# Patient Record
Sex: Female | Born: 1940
Health system: Southern US, Community
[De-identification: ages and names within clinical notes are randomized; demographics above are authoritative.]

## PROBLEM LIST (undated history)

## (undated) DIAGNOSIS — N8189 Other female genital prolapse: Secondary | ICD-10-CM

## (undated) DIAGNOSIS — R112 Nausea with vomiting, unspecified: Secondary | ICD-10-CM

## (undated) DIAGNOSIS — R079 Chest pain, unspecified: Secondary | ICD-10-CM

## (undated) DIAGNOSIS — I341 Nonrheumatic mitral (valve) prolapse: Secondary | ICD-10-CM

## (undated) DIAGNOSIS — M8580 Other specified disorders of bone density and structure, unspecified site: Secondary | ICD-10-CM

## (undated) DIAGNOSIS — Z9289 Personal history of other medical treatment: Secondary | ICD-10-CM

## (undated) DIAGNOSIS — I10 Essential (primary) hypertension: Secondary | ICD-10-CM

## (undated) DIAGNOSIS — I480 Paroxysmal atrial fibrillation: Secondary | ICD-10-CM

## (undated) DIAGNOSIS — E7800 Pure hypercholesterolemia, unspecified: Secondary | ICD-10-CM

## (undated) DIAGNOSIS — H35342 Macular cyst, hole, or pseudohole, left eye: Secondary | ICD-10-CM

## (undated) DIAGNOSIS — Z9889 Other specified postprocedural states: Secondary | ICD-10-CM

## (undated) HISTORY — DX: Essential (primary) hypertension: I10

## (undated) HISTORY — DX: Personal history of other medical treatment: Z92.89

## (undated) HISTORY — PX: CATARACT EXTRACTION, BILATERAL: SHX1313

## (undated) HISTORY — PX: EYE SURGERY: SHX253

## (undated) HISTORY — DX: Paroxysmal atrial fibrillation: I48.0

## (undated) HISTORY — DX: Pure hypercholesterolemia, unspecified: E78.00

## (undated) HISTORY — PX: COLONOSCOPY: SHX174

## (undated) HISTORY — PX: DILATION AND CURETTAGE OF UTERUS: SHX78

## (undated) HISTORY — DX: Other specified disorders of bone density and structure, unspecified site: M85.80

## (undated) HISTORY — DX: Chest pain, unspecified: R07.9

## (undated) HISTORY — DX: Nonrheumatic mitral (valve) prolapse: I34.1

## (undated) HISTORY — DX: Other female genital prolapse: N81.89

---

## 1999-04-18 ENCOUNTER — Other Ambulatory Visit: Admission: RE | Admit: 1999-04-18 | Discharge: 1999-04-18 | Payer: Self-pay | Admitting: Obstetrics and Gynecology

## 2002-03-23 ENCOUNTER — Encounter: Payer: Self-pay | Admitting: Emergency Medicine

## 2002-03-23 ENCOUNTER — Emergency Department (HOSPITAL_COMMUNITY): Admission: EM | Admit: 2002-03-23 | Discharge: 2002-03-23 | Payer: Self-pay | Admitting: Emergency Medicine

## 2002-05-24 ENCOUNTER — Encounter: Payer: Self-pay | Admitting: Family Medicine

## 2002-05-24 ENCOUNTER — Ambulatory Visit (HOSPITAL_COMMUNITY): Admission: RE | Admit: 2002-05-24 | Discharge: 2002-05-24 | Payer: Self-pay | Admitting: Family Medicine

## 2003-05-26 ENCOUNTER — Ambulatory Visit (HOSPITAL_COMMUNITY): Admission: RE | Admit: 2003-05-26 | Discharge: 2003-05-26 | Payer: Self-pay | Admitting: Obstetrics and Gynecology

## 2003-11-25 ENCOUNTER — Ambulatory Visit (HOSPITAL_COMMUNITY): Admission: RE | Admit: 2003-11-25 | Discharge: 2003-11-25 | Payer: Self-pay | Admitting: Internal Medicine

## 2005-07-05 ENCOUNTER — Ambulatory Visit (HOSPITAL_COMMUNITY): Admission: RE | Admit: 2005-07-05 | Discharge: 2005-07-05 | Payer: Self-pay | Admitting: Internal Medicine

## 2006-03-10 ENCOUNTER — Ambulatory Visit (HOSPITAL_COMMUNITY): Admission: RE | Admit: 2006-03-10 | Discharge: 2006-03-10 | Payer: Self-pay | Admitting: Obstetrics and Gynecology

## 2006-03-12 ENCOUNTER — Ambulatory Visit (HOSPITAL_COMMUNITY): Admission: RE | Admit: 2006-03-12 | Discharge: 2006-03-12 | Payer: Self-pay | Admitting: Obstetrics and Gynecology

## 2007-05-13 HISTORY — PX: DOPPLER ECHOCARDIOGRAPHY: SHX263

## 2009-06-05 HISTORY — PX: OTHER SURGICAL HISTORY: SHX169

## 2009-12-28 ENCOUNTER — Ambulatory Visit (HOSPITAL_COMMUNITY): Admission: RE | Admit: 2009-12-28 | Discharge: 2009-12-28 | Payer: Self-pay | Admitting: Internal Medicine

## 2010-01-11 ENCOUNTER — Ambulatory Visit (HOSPITAL_COMMUNITY): Admission: RE | Admit: 2010-01-11 | Discharge: 2010-01-11 | Payer: Self-pay | Admitting: Neurology

## 2010-03-30 ENCOUNTER — Ambulatory Visit (HOSPITAL_COMMUNITY): Admission: RE | Admit: 2010-03-30 | Discharge: 2010-03-30 | Payer: Self-pay | Admitting: Obstetrics and Gynecology

## 2011-04-30 DIAGNOSIS — M8580 Other specified disorders of bone density and structure, unspecified site: Secondary | ICD-10-CM

## 2011-04-30 HISTORY — DX: Other specified disorders of bone density and structure, unspecified site: M85.80

## 2011-06-04 ENCOUNTER — Encounter: Payer: Self-pay | Admitting: Gynecology

## 2011-06-04 ENCOUNTER — Other Ambulatory Visit: Payer: Self-pay | Admitting: Gynecology

## 2011-06-04 ENCOUNTER — Ambulatory Visit (INDEPENDENT_AMBULATORY_CARE_PROVIDER_SITE_OTHER): Payer: BC Managed Care – PPO | Admitting: Gynecology

## 2011-06-04 VITALS — BP 134/72 | Ht 66.5 in | Wt 159.0 lb

## 2011-06-04 DIAGNOSIS — L293 Anogenital pruritus, unspecified: Secondary | ICD-10-CM

## 2011-06-04 DIAGNOSIS — N950 Postmenopausal bleeding: Secondary | ICD-10-CM

## 2011-06-04 DIAGNOSIS — N898 Other specified noninflammatory disorders of vagina: Secondary | ICD-10-CM

## 2011-06-04 DIAGNOSIS — N9089 Other specified noninflammatory disorders of vulva and perineum: Secondary | ICD-10-CM

## 2011-06-04 DIAGNOSIS — N390 Urinary tract infection, site not specified: Secondary | ICD-10-CM

## 2011-06-04 DIAGNOSIS — N952 Postmenopausal atrophic vaginitis: Secondary | ICD-10-CM

## 2011-06-04 DIAGNOSIS — R350 Frequency of micturition: Secondary | ICD-10-CM

## 2011-06-04 LAB — URINALYSIS W MICROSCOPIC + REFLEX CULTURE
Crystals: NONE SEEN
Glucose, UA: NEGATIVE mg/dL
Nitrite: NEGATIVE
Protein, ur: NEGATIVE mg/dL

## 2011-06-04 MED ORDER — CIPROFLOXACIN HCL 250 MG PO TABS: 250.0000 mg | ORAL_TABLET | Freq: Two times a day (BID) | ORAL | Status: AC

## 2011-06-04 MED ORDER — CLINDAMYCIN PHOSPHATE 2 % VA CREA: 1.0000 | TOPICAL_CREAM | Freq: Every day | VAGINAL | Status: AC

## 2011-06-04 NOTE — Progress Notes (Signed)
Brittany Weaver 1940-06-08 [id number]        71 y.o.  New patient complaining of urinary frequency for several months and a cloudy urine. Also notes intense posterior vulvar vaginal pain and irritation. She has been using Premarin vaginal cream intermittently per Dr. Elana Alm for several years but this does not seem to be helping. She also notes episodes of some vaginal bleeding several years ago but none recently.  Past medical history,surgical history, medications, allergies, family history and social history were all reviewed and documented in the EPIC chart. ROS:  Was performed and pertinent positives and negatives are included in the history.  Exam: Sherri chaperone present Filed Vitals:   06/04/11 0952  BP: 134/72   General appearance  Normal Skin grossly normal Head/Neck normal with no cervical or supraclavicular adenopathy thyroid normal Lungs  clear Cardiac RR, without RMG Abdominal  soft, nontender, without masses, organomegaly or hernia Breasts  examined lying and sitting without masses, retractions, discharge or axillary adenopathy. Pelvic  Ext/BUS/vagina  normal with atrophic genital changes and a heavy white discharge mild red erythema at the posterior fourchette. Linear pigmented lesion mid left labia majora.  Cervix  normal    Uterus  anteverted, normal size, shape and contour, midline and mobile nontender   Adnexa  Without masses or tenderness    Anus and perineum  normal   Rectovaginal  normal sphincter tone without palpated masses or tenderness. Old external hemorrhoids noted.   Assessment/Plan:  71 y.o. female for annual exam.    1. Urinary symptoms. UA is consistent with UTI with TNTC WBC and bacteria. We'll treat with ciprofloxacin 250 mg twice a day x7 days. Follow up if symptoms persist or recur. 2. Vulvar pigmented lesion. Patient never noticed this before. Recommended excision and she will make an appointment for this. 3. Vulvar irritation. Wet prep is positive  for BV. We'll treat with Cleocin vaginal cream nightly x1 week. Follow up if symptoms persist or recur. 4. Atrophic vaginitis. Using Premarin cream unopposed. Had some bleeding early on but has not had any over the last several years. Recommend start with ultrasound for endometrial echo assessment. Possible biopsy if thicker. I reviewed the issues of unopposed estrogen and the WHI study increased risk of stroke heart attack DVT possible breast cancer risk. The need for progesterone protection discussed. Alternatives such as Vagifem also reviewed with less absorption advantages. We'll rediscuss after ultrasound/vulvar biopsy/results from Cleocin Cipro treatment. 5. Breast health. Patient due for mammogram now and she knows to schedule this and agrees to do so. SBE monthly reviewed. 6. Bone density. Patient reports a normal DEXA 2 years ago. Increase calcium vitamin D reviewed. 7. Pap smear. No Pap smear was done today. Her last Pap smear was April 2011 and was normal per her history.  She has no history of abnormal Pap smears before and has been getting them on a regular basis. I discussed current screening guidelines and the options to stop doing Pap smears from now on discussed as she is over age 40 and she is comfortable with this. 8. Colonoscopy. Patient has never had a colonoscopy. I strongly suggested she schedule this and she agrees to do so I gave her names of gastroenterology practices is in town. 9. Health maintenance. No blood work was done today as this done through her primary physician who follows her for her medical issues.    Dara Lords MD, 10:59 AM 06/04/2011

## 2011-06-04 NOTE — Patient Instructions (Signed)
Follow up for ultrasound and vulvar biopsy appointments. Schedule colonoscopy.

## 2011-06-06 ENCOUNTER — Other Ambulatory Visit: Payer: Self-pay | Admitting: Gynecology

## 2011-06-06 DIAGNOSIS — Z139 Encounter for screening, unspecified: Secondary | ICD-10-CM

## 2011-06-06 LAB — URINE CULTURE: Organism ID, Bacteria: NO GROWTH

## 2011-06-07 ENCOUNTER — Ambulatory Visit (HOSPITAL_COMMUNITY)
Admission: RE | Admit: 2011-06-07 | Discharge: 2011-06-07 | Disposition: A | Discharge status: Home / Self Care | Payer: Medicare Other | Source: Ambulatory Visit | Attending: Gynecology | Admitting: Gynecology

## 2011-06-07 DIAGNOSIS — Z1231 Encounter for screening mammogram for malignant neoplasm of breast: Secondary | ICD-10-CM | POA: Insufficient documentation

## 2011-06-07 DIAGNOSIS — Z139 Encounter for screening, unspecified: Secondary | ICD-10-CM

## 2011-06-17 ENCOUNTER — Ambulatory Visit (INDEPENDENT_AMBULATORY_CARE_PROVIDER_SITE_OTHER): Payer: Medicare Other

## 2011-06-17 ENCOUNTER — Encounter: Payer: Self-pay | Admitting: Gynecology

## 2011-06-17 ENCOUNTER — Ambulatory Visit (INDEPENDENT_AMBULATORY_CARE_PROVIDER_SITE_OTHER): Payer: Medicare Other | Admitting: Gynecology

## 2011-06-17 ENCOUNTER — Other Ambulatory Visit: Payer: Self-pay | Admitting: Gynecology

## 2011-06-17 DIAGNOSIS — N950 Postmenopausal bleeding: Secondary | ICD-10-CM

## 2011-06-17 DIAGNOSIS — N390 Urinary tract infection, site not specified: Secondary | ICD-10-CM

## 2011-06-17 DIAGNOSIS — N83339 Acquired atrophy of ovary and fallopian tube, unspecified side: Secondary | ICD-10-CM

## 2011-06-17 DIAGNOSIS — N952 Postmenopausal atrophic vaginitis: Secondary | ICD-10-CM

## 2011-06-17 NOTE — Progress Notes (Signed)
Patient also for sonohysterogram do 2 history of vaginal bleeding several years ago and the use of vaginal Premarin cream. Ultrasound initially shows homogeneous myometrial pattern. The endometrial echo 1.7 mm. Right and left ovaries visualized and normal consistent with postmenopausal status.  Cystogram was canceled due to the thin endometrium. I reviewed with the patient that with an endometrial echo of 1.7 mm it is highly unlikely that there is endometrial pathology and she agrees with skipping the biopsy. She has an appointment next week for her vulvar biopsy and will follow up for this. She does note that her vulvar symptoms have resolved and she is doing well from that standpoint.

## 2011-06-17 NOTE — Patient Instructions (Signed)
Follow up for vulvar biopsy 

## 2011-06-26 ENCOUNTER — Encounter: Payer: Self-pay | Admitting: Gynecology

## 2011-06-26 ENCOUNTER — Ambulatory Visit: Payer: BC Managed Care – PPO | Admitting: Gynecology

## 2011-06-26 ENCOUNTER — Ambulatory Visit (INDEPENDENT_AMBULATORY_CARE_PROVIDER_SITE_OTHER): Payer: Medicare Other | Admitting: Gynecology

## 2011-06-26 DIAGNOSIS — N952 Postmenopausal atrophic vaginitis: Secondary | ICD-10-CM

## 2011-06-26 DIAGNOSIS — N9089 Other specified noninflammatory disorders of vulva and perineum: Secondary | ICD-10-CM

## 2011-06-26 MED ORDER — ESTRADIOL 10 MCG VA TABS: 1.0000 | ORAL_TABLET | VAGINAL | Status: DC

## 2011-06-26 NOTE — Patient Instructions (Signed)
Office will call you with the biopsy results. Call us if the Vagifem is not working.

## 2011-06-26 NOTE — Progress Notes (Signed)
Patient presents with 2 issues. #1 Vaginal atrophy. She had been using intermittent Premarin vaginal cream per Dr. Angeline Slim was doing well with this. We recently did an ultrasound for endometrial surveillance and she had a thin endometrial stripe. She would like to restart the estrogen. #2 Pigmented raised linear lesion left labia majora that she is here to have excised.  Exam was Sherrilyn Rist chaperone present Raised pigmented linear lesion left labia majora. Area was cleansed with Betadine infiltrated with 1% lidocaine and the lesion was excised in its entirety in an elliptical incision. The skin and incision was closed using interrupted 3-0 Vicryl sutures x4. Postoperative care instructions were given. Patient will follow up for biopsy results.  Assessment and plan: 1. Atrophic vaginitis symptomatic. Discussed options. I recommended we try Vagifem 10 mcg I gave her a one-month start her sample and wrote her a prescription for an annual refill. I discussed the possible absorption risks and the WHI study to include stroke heart attack DVT possible breast cancer risk and endometrial stimulation. Patient understands and accepts. 2. Pigmented left labial lesion. It was excised sent to pathology and she will follow up for results.

## 2011-07-11 ENCOUNTER — Other Ambulatory Visit (HOSPITAL_COMMUNITY): Payer: Self-pay | Admitting: Family Medicine

## 2011-07-11 DIAGNOSIS — Z139 Encounter for screening, unspecified: Secondary | ICD-10-CM

## 2011-07-16 ENCOUNTER — Telehealth: Payer: Self-pay

## 2011-07-16 ENCOUNTER — Ambulatory Visit (HOSPITAL_COMMUNITY)
Admission: RE | Admit: 2011-07-16 | Discharge: 2011-07-16 | Disposition: A | Discharge status: Home / Self Care | Payer: Medicare Other | Source: Ambulatory Visit | Attending: Family Medicine | Admitting: Family Medicine

## 2011-07-16 DIAGNOSIS — Z139 Encounter for screening, unspecified: Secondary | ICD-10-CM

## 2011-07-16 DIAGNOSIS — Z13820 Encounter for screening for osteoporosis: Secondary | ICD-10-CM | POA: Insufficient documentation

## 2011-07-16 DIAGNOSIS — Z780 Asymptomatic menopausal state: Secondary | ICD-10-CM | POA: Insufficient documentation

## 2011-07-16 NOTE — Telephone Encounter (Signed)
PT. STATES STITCHES ARE STILL AT VULVAR AREA(APPT. WAS 06-26-11) AND WANTS TO HAVE AREA CHECKED I TRANSFERRED PT. TO APPTS.

## 2011-07-17 ENCOUNTER — Encounter: Payer: Self-pay | Admitting: Gynecology

## 2011-07-17 ENCOUNTER — Ambulatory Visit (INDEPENDENT_AMBULATORY_CARE_PROVIDER_SITE_OTHER): Payer: Medicare Other | Admitting: Gynecology

## 2011-07-17 VITALS — BP 130/78

## 2011-07-17 DIAGNOSIS — Z9889 Other specified postprocedural states: Secondary | ICD-10-CM

## 2011-07-17 NOTE — Patient Instructions (Signed)
Follow up as needed. Otherwise in one year.

## 2011-07-17 NOTE — Progress Notes (Signed)
Patient presents in follow up for persistent sutures status post labial lesion excision. She's otherwise doing well. I reviewed pathology with her which showed a benign seborrheic keratoses.  Exam with Elane Fritz chaperone present External with several Vicryl sutures in place left labia majora.  These were excised. The incision has healed nicely.  Patient will follow up when she is due for her next annual follow up sooner if any issues.

## 2012-07-01 ENCOUNTER — Encounter: Payer: Medicare Other | Admitting: Gynecology

## 2012-07-08 ENCOUNTER — Encounter: Payer: Self-pay | Admitting: Gynecology

## 2012-07-08 ENCOUNTER — Ambulatory Visit (INDEPENDENT_AMBULATORY_CARE_PROVIDER_SITE_OTHER): Payer: Medicare Other | Admitting: Gynecology

## 2012-07-08 VITALS — BP 120/70 | Ht 66.0 in | Wt 164.0 lb

## 2012-07-08 DIAGNOSIS — N816 Rectocele: Secondary | ICD-10-CM

## 2012-07-08 DIAGNOSIS — N952 Postmenopausal atrophic vaginitis: Secondary | ICD-10-CM

## 2012-07-08 DIAGNOSIS — N8111 Cystocele, midline: Secondary | ICD-10-CM

## 2012-07-08 MED ORDER — ESTRADIOL 10 MCG VA TABS: 1.0000 | ORAL_TABLET | VAGINAL | Status: DC

## 2012-07-08 NOTE — Progress Notes (Signed)
DALAYA SUPPA [birth year] [id number]        72 y.o.  G3P3003 for annual followup exam.  Several issues noted below  Past medical history,surgical history, medications, allergies, family history and social history were all reviewed and documented in the EPIC chart. ROS:  Was performed and pertinent positives and negatives are included in the history.  Exam: Kim assistant Filed Vitals:   07/08/12 1357  BP: 120/70  Height: 5\' 6"  (1.676 m)  Weight: 164 lb (74.39 kg)   General appearance  Normal Skin grossly normal Head/Neck normal with no cervical or supraclavicular adenopathy thyroid normal Lungs  clear Cardiac RR, without RMG Abdominal  soft, nontender, without masses, organomegaly or hernia Breasts  examined lying and sitting without masses, retractions, discharge or axillary adenopathy. Pelvic  Ext/BUS/vagina  small superficial linear laceration in her junction of labium minora and the vulva. Atrophic genital changes. Mild cystocele/rectocele and uterine prolapse  Cervix  normal   Uterus  anteverted, normal size, shape and contour, midline and mobile nontender. Mild prolapse with straining  Adnexa  Without masses or tenderness    Anus and perineum  normal   Rectovaginal  normal sphincter tone without palpated masses or tenderness.    Assessment/Plan:  72 y.o. G13P3003 female for annual followup exam.   1. Vulvar laceration. Patient had noticed some stinging on her vulva and on exam she has a very superficial linear tear at the junction of her labia majora and vulva. On questioning she thinks it's related to recent intercourse. Recommend observation as this will heal quickly. Followup if it does not. 2. Atrophic vaginitis. Patient using Vagifem doing well wants to continue.  I reviewed with her the issues of absorption and possible uterine stimulation or risks such as stroke heart attack DVT and breast cancer. All of this she understands and accepts. Did have ultrasound last year for  endometrial echo which showed an endometrial measurement of 1.7 mm. 3. Pelvic relaxation. Patient has a mild cystocele rectocele and uterine prolapse. She is asymptomatic from these without bladder or bowel complaints or pressure symptoms. We'll continue to monitor. 4. Pap smear 2011. No Pap smear done today. No history of abnormal Pap smears previously. Review current screening guidelines and we will stop screening and she is comfortable with this. 5. Osteopenia. DEXA 06/2011 T score -1.4. No FRAX done. In comparison to prior DEXA 2007 T score is stable. Continue to observe with repeat at several year interval. 6. Mammography due now and she knows to schedule this. SBE monthly reviewed. 7. Colonoscopy never. I again strongly recommended her to schedule this and the risks of colon cancer reviewed. Patient agrees to do so and is looking into [facility]. 8. Health maintenance. No blood work done as is all done through her primary physician. Followup one year, sooner as needed.    Dara Lords MD, 2:31 PM 07/08/2012

## 2012-07-08 NOTE — Patient Instructions (Signed)
Follow up in one year for annual exam 

## 2012-07-09 LAB — URINALYSIS W MICROSCOPIC + REFLEX CULTURE
Bilirubin Urine: NEGATIVE
Casts: NONE SEEN
Glucose, UA: NEGATIVE mg/dL
Hgb urine dipstick: NEGATIVE
Ketones, ur: NEGATIVE mg/dL
Nitrite: NEGATIVE
Protein, ur: NEGATIVE mg/dL
Squamous Epithelial / LPF: NONE SEEN
Urobilinogen, UA: 0.2 mg/dL (ref 0.0–1.0)

## 2012-07-10 LAB — URINE CULTURE: Colony Count: 65000

## 2012-07-13 ENCOUNTER — Other Ambulatory Visit: Payer: Self-pay | Admitting: Gynecology

## 2012-07-13 DIAGNOSIS — R8271 Bacteriuria: Secondary | ICD-10-CM

## 2012-10-26 ENCOUNTER — Other Ambulatory Visit: Payer: Self-pay | Admitting: *Deleted

## 2012-10-26 MED ORDER — LOVASTATIN 20 MG PO TABS: 20.0000 mg | ORAL_TABLET | Freq: Every day | ORAL | Status: DC

## 2012-10-26 NOTE — Telephone Encounter (Signed)
Refill authorized on lovastatin

## 2013-03-31 ENCOUNTER — Encounter: Payer: Self-pay | Admitting: Gastroenterology

## 2013-04-05 ENCOUNTER — Other Ambulatory Visit: Payer: Self-pay | Admitting: Cardiovascular Disease

## 2013-04-07 ENCOUNTER — Telehealth: Payer: Self-pay | Admitting: *Deleted

## 2013-04-07 DIAGNOSIS — E782 Mixed hyperlipidemia: Secondary | ICD-10-CM

## 2013-04-07 DIAGNOSIS — Z79899 Other long term (current) drug therapy: Secondary | ICD-10-CM

## 2013-04-07 NOTE — Telephone Encounter (Signed)
Paper chart received and reviewed.  Labs ordered and lab slip mailed.

## 2013-04-07 NOTE — Telephone Encounter (Signed)
Returned call and pt verified x 2.  Pt informed message received and refill was sent to [facility] on 12.8.14.  Pt stated she called and the automated message said it wasn't received.  Pt advised to call back and speak w/ a live person as refill was sent and should be ready for pick-up.  Pt also informed labs were not ordered at last visit, but will mail lab slip if needed.  Pt verbalized understanding and agreed w/ plan.

## 2013-04-07 NOTE — Telephone Encounter (Signed)
Paper chart requested.

## 2013-04-07 NOTE — Telephone Encounter (Signed)
Also wants to know if she needs blood work possibly??

## 2013-04-07 NOTE — Telephone Encounter (Signed)
Pt needs a refill on Metoprolol and she has no refills per pharmacy

## 2013-05-14 ENCOUNTER — Ambulatory Visit: Payer: Medicare Other | Admitting: Cardiovascular Disease

## 2013-05-17 ENCOUNTER — Ambulatory Visit (AMBULATORY_SURGERY_CENTER): Payer: Self-pay

## 2013-05-17 VITALS — Ht 66.0 in | Wt 156.0 lb

## 2013-05-17 DIAGNOSIS — Z1211 Encounter for screening for malignant neoplasm of colon: Secondary | ICD-10-CM

## 2013-05-17 MED ORDER — MOVIPREP 100 G PO SOLR: 1.0000 | Freq: Once | ORAL | Status: DC

## 2013-05-20 ENCOUNTER — Encounter: Payer: Self-pay | Admitting: Gastroenterology

## 2013-06-01 ENCOUNTER — Ambulatory Visit (INDEPENDENT_AMBULATORY_CARE_PROVIDER_SITE_OTHER): Payer: Medicare Other | Admitting: Cardiovascular Disease

## 2013-06-01 ENCOUNTER — Encounter: Payer: Self-pay | Admitting: Cardiovascular Disease

## 2013-06-01 VITALS — BP 138/64 | HR 54 | Ht 66.5 in | Wt 162.0 lb

## 2013-06-01 DIAGNOSIS — I10 Essential (primary) hypertension: Secondary | ICD-10-CM | POA: Insufficient documentation

## 2013-06-01 DIAGNOSIS — E785 Hyperlipidemia, unspecified: Secondary | ICD-10-CM

## 2013-06-01 DIAGNOSIS — I480 Paroxysmal atrial fibrillation: Secondary | ICD-10-CM | POA: Insufficient documentation

## 2013-06-01 DIAGNOSIS — I4891 Unspecified atrial fibrillation: Secondary | ICD-10-CM

## 2013-06-01 LAB — COMPREHENSIVE METABOLIC PANEL
ALT: 17 U/L (ref 0–35)
AST: 24 U/L (ref 0–37)
Albumin: 4.5 g/dL (ref 3.5–5.2)
Alkaline Phosphatase: 41 U/L (ref 39–117)
BUN: 18 mg/dL (ref 6–23)
CO2: 27 mEq/L (ref 19–32)
Calcium: 9.9 mg/dL (ref 8.4–10.5)
Chloride: 101 mEq/L (ref 96–112)
Creat: 0.88 mg/dL (ref 0.50–1.10)
Glucose, Bld: 89 mg/dL (ref 70–99)
Potassium: 4.1 mEq/L (ref 3.5–5.3)
Sodium: 136 mEq/L (ref 135–145)
Total Bilirubin: 0.6 mg/dL (ref 0.2–1.2)
Total Protein: 7.4 g/dL (ref 6.0–8.3)

## 2013-06-01 LAB — LIPID PANEL
Cholesterol: 156 mg/dL (ref 0–200)
HDL: 57 mg/dL (ref >39)
LDL Cholesterol: 69 mg/dL (ref 0–99)
Total CHOL/HDL Ratio: 2.7 Ratio
Triglycerides: 151 mg/dL — ABNORMAL HIGH (ref <150)
VLDL: 30 mg/dL (ref 0–40)

## 2013-06-01 NOTE — Progress Notes (Signed)
06/01/2013 Brittany Weaver   1941-01-01  [id number]  Primary Physician Glo Herring., MD Primary Cardiologist: Lorretta Harp MD [name]   HPI:  The patient is a 73 year old, thin-appearing, married Caucasian female, mother of 3 who I last saw in the office a year ago. She has a strong family history of heart disease, as well as hypertension, hyperlipidemia and paroxysmal A-fib. She is a retired Loss adjuster, chartered. Her last Myoview performed 2 years ago was nonischemic. She denies chest pain or shortness of breath. She does have occasional episodes of breakthrough A-fib with some dizziness but no presyncope.      Current Outpatient Prescriptions  Medication Sig Dispense Refill  . aspirin 81 MG tablet Take 160 mg by mouth daily.      . Calcium Carbonate-Vitamin D (CALCIUM + D PO) Take by mouth. 573m once daily      . hydrochlorothiazide (HYDRODIURIL) 25 MG tablet Take 25 mg by mouth daily.      .Marland Kitchenlovastatin (MEVACOR) 20 MG tablet Take 1 tablet (20 mg total) by mouth at bedtime.  30 tablet  6  . Magnesium 400 MG CAPS Take by mouth.      . magnesium oxide (MAG-OX) 400 MG tablet Take 400 mg by mouth daily.      . metoprolol tartrate (LOPRESSOR) 25 MG tablet TAKE ONE TABLET BY MOUTH TWICE DAILY  60 tablet  2  . Multiple Vitamin (MULTIVITAMIN) tablet Take 1 tablet by mouth daily.      . Omega-3 Fatty Acids (FISH OIL) 1200 MG CAPS Take by mouth.      . vitamin B-12 (CYANOCOBALAMIN) 1000 MCG tablet Take 1,000 mcg by mouth daily.      . Estradiol (VAGIFEM) 10 MCG TABS Place 1 tablet (10 mcg total) vaginally 2 (two) times a week.  8 tablet  11  . MOVIPREP 100 G SOLR Take 1 kit (200 g total) by mouth once.  1 kit  0   No current facility-administered medications for this visit.    Allergies  Allergen Reactions  . Penicillins     HIVES  . Prednisone     DIZZINESS    History   Social History  . Marital Status: Married    Spouse Name: N/A    Number of  Children: N/A  . Years of Education: N/A   Occupational History  . Not on file.   Social History Main Topics  . Smoking status: Never Smoker   . Smokeless tobacco: Never Used  . Alcohol Use: Yes     Comment: RARELY  . Drug Use: No  . Sexual Activity: Yes    Birth Control/ Protection: Post-menopausal   Other Topics Concern  . Not on file   Social History Narrative  . No narrative on file     Review of Systems: General: negative for chills, fever, night sweats or weight changes.  Cardiovascular: negative for chest pain, dyspnea on exertion, edema, orthopnea, palpitations, paroxysmal nocturnal dyspnea or shortness of breath Dermatological: negative for rash Respiratory: negative for cough or wheezing Urologic: negative for hematuria Abdominal: negative for nausea, vomiting, diarrhea, bright red blood per rectum, melena, or hematemesis Neurologic: negative for visual changes, syncope, or dizziness All other systems reviewed and are otherwise negative except as noted above.    Blood pressure 138/64, pulse 54, height 5' 6.5" (1.689 m), weight 162 lb (73.483 kg).  General appearance: alert and no distress Neck: no adenopathy, no carotid bruit, no JVD, supple,  symmetrical, trachea midline and thyroid not enlarged, symmetric, no tenderness/mass/nodules Lungs: clear to auscultation bilaterally Heart: regular rate and rhythm, S1, S2 normal, no murmur, click, rub or gallop Extremities: extremities normal, atraumatic, no cyanosis or edema  EKG sinus bradycardia at 54 without ST or T wave changes  ASSESSMENT AND PLAN:   Essential hypertension Well-controlled on current medications  Hyperlipidemia On statin therapy with excellent lipid profile recently checked 05/31/13 with a total cholesterol of 156, LDL 69 and an HDL of 57  Paroxysmal atrial fibrillation Maintaining sinus rhythm with rare breakthrough episodes      Lorretta Harp MD [facility], [facility] 06/01/2013 12:55  PM

## 2013-06-01 NOTE — Patient Instructions (Signed)
Dr Berry wants you to follow-up in 1 year . You will receive a reminder letter in the mail two months in advance. If you don't receive a letter, please call our office to schedule the follow-up appointment. 

## 2013-06-01 NOTE — Assessment & Plan Note (Signed)
Well-controlled on current medications 

## 2013-06-01 NOTE — Assessment & Plan Note (Addendum)
Maintaining sinus rhythm with rare breakthrough episodes

## 2013-06-01 NOTE — Assessment & Plan Note (Signed)
On statin therapy with excellent lipid profile recently checked 05/31/13 with a total cholesterol of 156, LDL 69 and an HDL of 57

## 2013-06-02 ENCOUNTER — Encounter: Payer: Self-pay | Admitting: *Deleted

## 2013-06-04 ENCOUNTER — Ambulatory Visit (AMBULATORY_SURGERY_CENTER): Payer: Medicare Other | Admitting: Gastroenterology

## 2013-06-04 ENCOUNTER — Encounter: Payer: Self-pay | Admitting: Gastroenterology

## 2013-06-04 VITALS — BP 112/61 | HR 43 | Temp 96.1°F | Resp 17 | Ht 66.0 in | Wt 156.0 lb

## 2013-06-04 DIAGNOSIS — Z1211 Encounter for screening for malignant neoplasm of colon: Secondary | ICD-10-CM

## 2013-06-04 MED ORDER — SODIUM CHLORIDE 0.9 % IV SOLN: 500.0000 mL | INTRAVENOUS | Status: DC

## 2013-06-04 NOTE — Op Note (Addendum)
[place]  [address] [place], [id number]   COLONOSCOPY PROCEDURE REPORT  PATIENT: Brittany Weaver, Brittany Weaver  MR#: [id number] BIRTHDATE: January 04, 1941 , 72  yrs. old GENDER: Female ENDOSCOPIST: Ladene Artist, MD, [facility] REFERRED VE:[facility] Gerarda Fraction, M.D. PROCEDURE DATE:  06/04/2013 PROCEDURE:   Colonoscopy, screening First Screening Colonoscopy - Avg.  risk and is 50 yrs.  old or older - No.  Prior Negative Screening - Now for repeat screening. 10 or more years since last screening  History of Adenoma - Now for follow-up colonoscopy & has been > or = to 3 yrs.  N/A  Polyps Removed Today? No.  Recommend repeat exam, <10 yrs? No. ASA CLASS:   Class II INDICATIONS:average risk screening. MEDICATIONS: MAC sedation, administered by CRNA and propofol (Diprivan) 200mg  IV DESCRIPTION OF PROCEDURE:   After the risks benefits and alternatives of the procedure were thoroughly explained, informed consent was obtained.  A digital rectal exam revealed no abnormalities of the rectum.   The [id number]  endoscope was introduced through the anus and advanced to the cecum, which was identified by both the appendix and ileocecal valve. No adverse events experienced with a tortuous colon.   The quality of the prep was good, using MoviPrep  The instrument was then slowly withdrawn as the colon was fully examined.  COLON FINDINGS: Mild diverticulosis was noted in the sigmoid colon. The colon was otherwise normal.  There was no diverticulosis, inflammation, polyps or cancers unless previously stated. Retroflexed views revealed small internal hemorrhoids. The time to cecum=4 minutes 25 seconds.  Withdrawal time=9 minutes 52 seconds. The scope was withdrawn and the procedure completed. COMPLICATIONS: There were no complications.  ENDOSCOPIC IMPRESSION: 1.   Mild diverticulosis in the sigmoid colon 2.   Small internal hemorrhoids  RECOMMENDATIONS: 1.  High fiber diet with liberal fluid  intake. 2.  Given your age, you will not need another colonoscopy for colon cancer screening or polyp surveillance.  These types of tests usually stop around the age 83.  eSigned:  Ladene Artist, MD, [facility] 06/04/2013 10:58 AM Revised: 06/04/2013 10:58 AM

## 2013-06-04 NOTE — Progress Notes (Signed)
Procedure ends, to recovery, report given and VSS. 

## 2013-06-04 NOTE — Patient Instructions (Signed)
YOU HAD AN ENDOSCOPIC PROCEDURE TODAY AT [facility]: Refer to the procedure report that was given to you for any specific questions about what was found during the examination.  If the procedure report does not answer your questions, please call your gastroenterologist to clarify.  If you requested that your care partner not be given the details of your procedure findings, then the procedure report has been included in a sealed envelope for you to review at your convenience later.  YOU SHOULD EXPECT: Some feelings of bloating in the abdomen. Passage of more gas than usual.  Walking can help get rid of the air that was put into your GI tract during the procedure and reduce the bloating. If you had a lower endoscopy (such as a colonoscopy or flexible sigmoidoscopy) you may notice spotting of blood in your stool or on the toilet paper. If you underwent a bowel prep for your procedure, then you may not have a normal bowel movement for a few days.  DIET: Your first meal following the procedure should be a light meal and then it is ok to progress to your normal diet.  A half-sandwich or bowl of soup is an example of a good first meal.  Heavy or fried foods are harder to digest and may make you feel nauseous or bloated.  Likewise meals heavy in dairy and vegetables can cause extra gas to form and this can also increase the bloating.  Drink plenty of fluids but you should avoid alcoholic beverages for 24 hours.  TRY TO INCREASE THE FIBER IN OR DIET.  ACTIVITY: Your care partner should take you home directly after the procedure.  You should plan to take it easy, moving slowly for the rest of the day.  You can resume normal activity the day after the procedure however you should NOT DRIVE or use heavy machinery for 24 hours (because of the sedation medicines used during the test).    SYMPTOMS TO REPORT IMMEDIATELY: A gastroenterologist can be reached at any hour.  During normal business hours, 8:30  AM to 5:00 PM Monday through Friday, call [phone].  After hours and on weekends, please call the GI answering service at [phone] who will take a message and have the physician on call contact you.   Following lower endoscopy (colonoscopy or flexible sigmoidoscopy):  Excessive amounts of blood in the stool  Significant tenderness or worsening of abdominal pains  Swelling of the abdomen that is new, acute  Fever of 100F or higher  FOLLOW UP: If any biopsies were taken you will be contacted by phone or by letter within the next 1-3 weeks.  Call your gastroenterologist if you have not heard about the biopsies in 3 weeks.  Our staff will call the home number listed on your records the next business day following your procedure to check on you and address any questions or concerns that you may have at that time regarding the information given to you following your procedure. This is a courtesy call and so if there is no answer at the home number and we have not heard from you through the emergency physician on call, we will assume that you have returned to your regular daily activities without incident.  SIGNATURES/CONFIDENTIALITY: You and/or your care partner have signed paperwork which will be entered into your electronic medical record.  These signatures attest to the fact that that the information above on your After Visit Summary has been reviewed and is  understood.  Full responsibility of the confidentiality of this discharge information lies with you and/or your care-partner. 

## 2013-06-07 ENCOUNTER — Other Ambulatory Visit: Payer: Self-pay | Admitting: Cardiovascular Disease

## 2013-06-07 ENCOUNTER — Telehealth: Payer: Self-pay

## 2013-06-07 NOTE — Telephone Encounter (Signed)
  Follow up Call-  Call back number 06/04/2013  Post procedure Call Back phone  # [phone]  Permission to leave phone message Yes     Patient questions:  Do you have a fever, pain , or abdominal swelling? no Pain Score  0 *  Have you tolerated food without any problems? yes  Have you been able to return to your normal activities? yes  Do you have any questions about your discharge instructions: Diet   no Medications  no Follow up visit  no  Do you have questions or concerns about your Care? no  Actions: * If pain score is 4 or above: No action needed, pain <4.

## 2013-06-07 NOTE — Telephone Encounter (Signed)
Rx was sent to pharmacy electronically. 

## 2013-06-15 ENCOUNTER — Ambulatory Visit: Payer: Medicare Other | Admitting: Cardiovascular Disease

## 2013-07-05 ENCOUNTER — Other Ambulatory Visit: Payer: Self-pay | Admitting: Cardiovascular Disease

## 2013-07-05 NOTE — Telephone Encounter (Signed)
Rx was sent to pharmacy electronically. 

## 2013-09-23 ENCOUNTER — Ambulatory Visit (INDEPENDENT_AMBULATORY_CARE_PROVIDER_SITE_OTHER): Payer: Medicare Other | Admitting: Gynecology

## 2013-09-23 ENCOUNTER — Encounter: Payer: Self-pay | Admitting: Gynecology

## 2013-09-23 VITALS — BP 122/76 | Ht 66.0 in | Wt 161.0 lb

## 2013-09-23 DIAGNOSIS — N814 Uterovaginal prolapse, unspecified: Secondary | ICD-10-CM

## 2013-09-23 DIAGNOSIS — N952 Postmenopausal atrophic vaginitis: Secondary | ICD-10-CM

## 2013-09-23 DIAGNOSIS — M949 Disorder of cartilage, unspecified: Secondary | ICD-10-CM

## 2013-09-23 DIAGNOSIS — M8580 Other specified disorders of bone density and structure, unspecified site: Secondary | ICD-10-CM

## 2013-09-23 DIAGNOSIS — N8111 Cystocele, midline: Secondary | ICD-10-CM

## 2013-09-23 DIAGNOSIS — M899 Disorder of bone, unspecified: Secondary | ICD-10-CM

## 2013-09-23 DIAGNOSIS — N816 Rectocele: Secondary | ICD-10-CM

## 2013-09-23 MED ORDER — ESTRADIOL 10 MCG VA TABS: 1.0000 | ORAL_TABLET | VAGINAL | Status: DC

## 2013-09-23 NOTE — Patient Instructions (Signed)
Continue on Vagifem as we discussed. Report any vaginal bleeding. Followup in one year for annual exam.  You may obtain a copy of any labs that were done today by logging onto MyChart as outlined in the instructions provided with your AVS (after visit summary). The office will not call with normal lab results but certainly if there are any significant abnormalities then we will contact you.   Health Maintenance, Female A healthy lifestyle and preventative care can promote health and wellness.  Maintain regular health, dental, and eye exams.  Eat a healthy diet. Foods like vegetables, fruits, whole grains, low-fat dairy products, and lean protein foods contain the nutrients you need without too many calories. Decrease your intake of foods high in solid fats, added sugars, and salt. Get information about a proper diet from your caregiver, if necessary.  Regular physical exercise is one of the most important things you can do for your health. Most adults should get at least 150 minutes of moderate-intensity exercise (any activity that increases your heart rate and causes you to sweat) each week. In addition, most adults need muscle-strengthening exercises on 2 or more days a week.   Maintain a healthy weight. The body mass index (BMI) is a screening tool to identify possible weight problems. It provides an estimate of body fat based on height and weight. Your caregiver can help determine your BMI, and can help you achieve or maintain a healthy weight. For adults 20 years and older:  A BMI below 18.5 is considered underweight.  A BMI of 18.5 to 24.9 is normal.  A BMI of 25 to 29.9 is considered overweight.  A BMI of 30 and above is considered obese.  Maintain normal blood lipids and cholesterol by exercising and minimizing your intake of saturated fat. Eat a balanced diet with plenty of fruits and vegetables. Blood tests for lipids and cholesterol should begin at age 57 and be repeated every 5  years. If your lipid or cholesterol levels are high, you are over 50, or you are a high risk for heart disease, you may need your cholesterol levels checked more frequently.Ongoing high lipid and cholesterol levels should be treated with medicines if diet and exercise are not effective.  If you smoke, find out from your caregiver how to quit. If you do not use tobacco, do not start.  Lung cancer screening is recommended for adults aged 48 80 years who are at high risk for developing lung cancer because of a history of smoking. Yearly low-dose computed tomography (CT) is recommended for people who have at least a 30-pack-year history of smoking and are a current smoker or have quit within the past 15 years. A pack year of smoking is smoking an average of 1 pack of cigarettes a day for 1 year (for example: 1 pack a day for 30 years or 2 packs a day for 15 years). Yearly screening should continue until the smoker has stopped smoking for at least 15 years. Yearly screening should also be stopped for people who develop a health problem that would prevent them from having lung cancer treatment.  If you are pregnant, do not drink alcohol. If you are breastfeeding, be very cautious about drinking alcohol. If you are not pregnant and choose to drink alcohol, do not exceed 1 drink per day. One drink is considered to be 12 ounces (355 mL) of beer, 5 ounces (148 mL) of wine, or 1.5 ounces (44 mL) of liquor.  Avoid use of street  drugs. Do not share needles with anyone. Ask for help if you need support or instructions about stopping the use of drugs.  High blood pressure causes heart disease and increases the risk of stroke. Blood pressure should be checked at least every 1 to 2 years. Ongoing high blood pressure should be treated with medicines, if weight loss and exercise are not effective.  If you are 59 to 74 years old, ask your caregiver if you should take aspirin to prevent strokes.  Diabetes screening  involves taking a blood sample to check your fasting blood sugar level. This should be done once every 3 years, after age 75, if you are within normal weight and without risk factors for diabetes. Testing should be considered at a younger age or be carried out more frequently if you are overweight and have at least 1 risk factor for diabetes.  Breast cancer screening is essential preventative care for women. You should practice "breast self-awareness." This means understanding the normal appearance and feel of your breasts and may include breast self-examination. Any changes detected, no matter how small, should be reported to a caregiver. Women in their 45s and 30s should have a clinical breast exam (CBE) by a caregiver as part of a regular health exam every 1 to 3 years. After age 25, women should have a CBE every year. Starting at age 75, women should consider having a mammogram (breast X-ray) every year. Women who have a family history of breast cancer should talk to their caregiver about genetic screening. Women at a high risk of breast cancer should talk to their caregiver about having an MRI and a mammogram every year.  Breast cancer gene (BRCA)-related cancer risk assessment is recommended for women who have family members with BRCA-related cancers. BRCA-related cancers include breast, ovarian, tubal, and peritoneal cancers. Having family members with these cancers may be associated with an increased risk for harmful changes (mutations) in the breast cancer genes BRCA1 and BRCA2. Results of the assessment will determine the need for genetic counseling and BRCA1 and BRCA2 testing.  The Pap test is a screening test for cervical cancer. Women should have a Pap test starting at age 96. Between ages 37 and 12, Pap tests should be repeated every 2 years. Beginning at age 61, you should have a Pap test every 3 years as long as the past 3 Pap tests have been normal. If you had a hysterectomy for a problem that  was not cancer or a condition that could lead to cancer, then you no longer need Pap tests. If you are between ages 80 and 93, and you have had normal Pap tests going back 10 years, you no longer need Pap tests. If you have had past treatment for cervical cancer or a condition that could lead to cancer, you need Pap tests and screening for cancer for at least 20 years after your treatment. If Pap tests have been discontinued, risk factors (such as a new sexual partner) need to be reassessed to determine if screening should be resumed. Some women have medical problems that increase the chance of getting cervical cancer. In these cases, your caregiver may recommend more frequent screening and Pap tests.  The human papillomavirus (HPV) test is an additional test that may be used for cervical cancer screening. The HPV test looks for the virus that can cause the cell changes on the cervix. The cells collected during the Pap test can be tested for HPV. The HPV test could be  used to screen women aged 10 years and older, and should be used in women of any age who have unclear Pap test results. After the age of 107, women should have HPV testing at the same frequency as a Pap test.  Colorectal cancer can be detected and often prevented. Most routine colorectal cancer screening begins at the age of 43 and continues through age 60. However, your caregiver may recommend screening at an earlier age if you have risk factors for colon cancer. On a yearly basis, your caregiver may provide home test kits to check for hidden blood in the stool. Use of a small camera at the end of a tube, to directly examine the colon (sigmoidoscopy or colonoscopy), can detect the earliest forms of colorectal cancer. Talk to your caregiver about this at age 41, when routine screening begins. Direct examination of the colon should be repeated every 5 to 10 years through age 13, unless early forms of pre-cancerous polyps or small growths are  found.  Hepatitis C blood testing is recommended for all people born from [birth year] through 1966 and any individual with known risks for hepatitis C.  Practice safe sex. Use condoms and avoid high-risk sexual practices to reduce the spread of sexually transmitted infections (STIs). Sexually active women aged 79 and younger should be checked for Chlamydia, which is a common sexually transmitted infection. Older women with new or multiple partners should also be tested for Chlamydia. Testing for other STIs is recommended if you are sexually active and at increased risk.  Osteoporosis is a disease in which the bones lose minerals and strength with aging. This can result in serious bone fractures. The risk of osteoporosis can be identified using a bone density scan. Women ages 58 and over and women at risk for fractures or osteoporosis should discuss screening with their caregivers. Ask your caregiver whether you should be taking a calcium supplement or vitamin D to reduce the rate of osteoporosis.  Menopause can be associated with physical symptoms and risks. Hormone replacement therapy is available to decrease symptoms and risks. You should talk to your caregiver about whether hormone replacement therapy is right for you.  Use sunscreen. Apply sunscreen liberally and repeatedly throughout the day. You should seek shade when your shadow is shorter than you. Protect yourself by wearing long sleeves, pants, a wide-brimmed hat, and sunglasses year round, whenever you are outdoors.  Notify your caregiver of new moles or changes in moles, especially if there is a change in shape or color. Also notify your caregiver if a mole is larger than the size of a pencil eraser.  Stay current with your immunizations. Document Released: 10/29/2010 Document Revised: 08/10/2012 Document Reviewed: 10/29/2010 [facility] Patient Information 2014 [place].

## 2013-09-23 NOTE — Progress Notes (Signed)
Brittany Weaver [birth year] [id number]        73 y.o.  G3P3003 for followup exam.  Past medical history,surgical history, problem list, medications, allergies, family history and social history were all reviewed and documented as reviewed in the EPIC chart.  ROS:  12 system ROS performed with pertinent positives and negatives included in the history, assessment and plan.  Included Systems: General, HEENT, Neck, Cardiovascular, Pulmonary, Gastrointestinal, Genitourinary, Musculoskeletal, Dermatologic, Endocrine, Hematological, Neurologic, Psychiatric Additional significant findings :  None   Exam: Kim assistant Filed Vitals:   09/23/13 0953  BP: 122/76  Height: 5\' 6"  (1.676 m)  Weight: 161 lb (73.029 kg)   General appearance:  Normal affect, orientation and appearance. Skin: Grossly normal HEENT: Without gross lesions.  No cervical or supraclavicular adenopathy. Thyroid normal.  Lungs:  Clear without wheezing, rales or rhonchi Cardiac: RR, without RMG Abdominal:  Soft, nontender, without masses, guarding, rebound, organomegaly or hernia Breasts:  Examined lying and sitting without masses, retractions, discharge or axillary adenopathy. Pelvic:  Ext/BUS/vagina with generalized atrophic changes. First degree cystocele, mild uterine prolapse, mild rectocele noted.  Cervix with atrophic changes.  Uterus anteverted, normal size, shape and contour, midline and mobile nontender   Adnexa  Without masses or tenderness    Anus and perineum  Normal   Rectovaginal  Normal sphincter tone without palpated masses or tenderness.    Assessment/Plan:  73 y.o. G3P3003 female for followup exam.   1. Atrophic genital changes. Patient using Vagifem 10 mcg twice weekly with good results. Patient wants to continue. Refill x1 year provided. We have previously discussed issues of absorption risks to include stroke heart attack DVT endometrial stimulation breast cancer issues. No vaginal bleeding and patient  knows to report any vaginal bleeding. 2. Pelvic relaxation. Patient does have mild cystocele/rectocele/uterine prolapse. Patient is asymptomatic. Patient will continue observation if she develops any symptoms she knows to follow up with me. 3. Osteopenia. DEXA 2013 T score -1.4. Stable from prior DEXA 2007. We'll plan to repeat at 5 year interval. Increase calcium vitamin D reviewed. 4. Mammogram 2013. Patient knows she's overdue and agrees to schedule. SBE monthly review. 5. Pap smear 2011. No Pap smear done today. No history of abnormal Pap smears. We have discussed current screening guidelines and she is comfortable stop screening. 6. Colonoscopy 2015. Repeated their recommended interval. 7. Health maintenance. No blood work done as this is all done through her primary physician's office. Will check baseline urinalysis. Followup in one year, sooner as needed.   Note: This document was prepared with digital dictation and possible smart phrase technology. Any transcriptional errors that result from this process are unintentional.   Anastasio Auerbach MD, 10:54 AM 09/23/2013

## 2013-09-24 LAB — URINALYSIS W MICROSCOPIC + REFLEX CULTURE
BACTERIA UA: NONE SEEN
BILIRUBIN URINE: NEGATIVE
Casts: NONE SEEN
Crystals: NONE SEEN
GLUCOSE, UA: NEGATIVE mg/dL
Hgb urine dipstick: NEGATIVE
KETONES UR: NEGATIVE mg/dL
Nitrite: NEGATIVE
PROTEIN: NEGATIVE mg/dL
Specific Gravity, Urine: 1.009 (ref 1.005–1.030)
Urobilinogen, UA: 0.2 mg/dL (ref 0.0–1.0)
pH: 7 (ref 5.0–8.0)

## 2013-09-27 ENCOUNTER — Other Ambulatory Visit: Payer: Self-pay | Admitting: Gynecology

## 2013-09-27 LAB — URINE CULTURE: Colony Count: 15000

## 2013-09-27 MED ORDER — SULFAMETHOXAZOLE-TMP DS 800-160 MG PO TABS: 1.0000 | ORAL_TABLET | Freq: Two times a day (BID) | ORAL | Status: DC

## 2014-02-28 ENCOUNTER — Encounter: Payer: Self-pay | Admitting: Gynecology

## 2014-06-14 ENCOUNTER — Encounter: Payer: Self-pay | Admitting: Cardiovascular Disease

## 2014-06-14 ENCOUNTER — Ambulatory Visit (INDEPENDENT_AMBULATORY_CARE_PROVIDER_SITE_OTHER): Payer: Medicare Other | Admitting: Cardiovascular Disease

## 2014-06-14 VITALS — BP 134/64 | HR 64 | Ht 66.5 in | Wt 161.2 lb

## 2014-06-14 DIAGNOSIS — E785 Hyperlipidemia, unspecified: Secondary | ICD-10-CM

## 2014-06-14 DIAGNOSIS — I480 Paroxysmal atrial fibrillation: Secondary | ICD-10-CM

## 2014-06-14 DIAGNOSIS — Z79899 Other long term (current) drug therapy: Secondary | ICD-10-CM

## 2014-06-14 DIAGNOSIS — I10 Essential (primary) hypertension: Secondary | ICD-10-CM

## 2014-06-14 NOTE — Assessment & Plan Note (Signed)
History of hypertension with blood pressure measures 134/64. She has a hydro-Diuril and metoprolol. Continue current meds at current dosing

## 2014-06-14 NOTE — Patient Instructions (Signed)
Your physician wants you to follow-up in 1 year with Dr. Gwenlyn Found. You will receive a reminder letter in the mail 2 months in advance. If you do not receive a letter, please call our office to schedule the follow-up appointment.  Dr. Gwenlyn Found has ordered for you to have lab work done in the next few days, and you NEED to be FASTING.

## 2014-06-14 NOTE — Assessment & Plan Note (Signed)
History of paroxysmal atrial fibrillation with 3-4 short episodes in the last year. We have talked about anticoagulation which she prefers not to be on. We discussed the noval  oral anticoagulants.given her infrequent episodes I feel comfortable keeping her on aspirin alone.

## 2014-06-14 NOTE — Assessment & Plan Note (Signed)
History of hyperlipidemia on lovastatin 20 mg a day. We will recheck a lipid and liver profile

## 2014-06-14 NOTE — Progress Notes (Signed)
06/14/2014 Brittany Weaver   Sep 20, 1940  [id number]  Primary Physician Glo Herring., MD Primary Cardiologist: Lorretta Harp MD [name]   HPI:  The patient is a 74 year old, thin-appearing, married Caucasian female, mother of 3 who I last saw in the office a year ago. She has a strong family history of heart disease, as well as hypertension, hyperlipidemia and paroxysmal A-fib. She is a retired Loss adjuster, [employer]. Her last Myoview performed 3 years ago was nonischemic. She denies chest pain or shortness of breath. She does have occasional episodes of breakthrough A-fib with some dizziness but no presyncope. Her only complaints are of scapular pain which sounds musculoskeletal. We will discuss oral anticoagulation which she preferred not to be on which I can't disagree with given her relative infrequency of breakthrough episodes.   Current Outpatient Prescriptions  Medication Sig Dispense Refill  . aspirin 81 MG tablet Take 160 mg by mouth daily.    Marland Kitchen BIOTIN PO Take by mouth.    . Calcium Carbonate-Vitamin D (CALCIUM + D PO) Take by mouth. 500mg  once daily    . Estradiol (VAGIFEM) 10 MCG TABS vaginal tablet Place 1 tablet (10 mcg total) vaginally 2 (two) times a week. 8 tablet 11  . hydrochlorothiazide (HYDRODIURIL) 25 MG tablet Take 25 mg by mouth daily.    Marland Kitchen lovastatin (MEVACOR) 20 MG tablet TAKE ONE TABLET BY MOUTH AT BEDTIME 30 tablet 11  . Magnesium 400 MG CAPS Take by mouth.    . metoprolol tartrate (LOPRESSOR) 25 MG tablet TAKE ONE TABLET BY MOUTH TWICE DAILY 60 tablet 11  . Multiple Vitamin (MULTIVITAMIN) tablet Take 1 tablet by mouth daily.    . Omega-3 Fatty Acids (FISH OIL) 1200 MG CAPS Take by mouth.    . vitamin B-12 (CYANOCOBALAMIN) 1000 MCG tablet Take 1,000 mcg by mouth daily.     No current facility-administered medications for this visit.    Allergies  Allergen Reactions  . Penicillins     HIVES  . Prednisone     DIZZINESS and  swelling    History   Social History  . Marital Status: Married    Spouse Name: N/A  . Number of Children: N/A  . Years of Education: N/A   Occupational History  . Not on file.   Social History Main Topics  . Smoking status: Never Smoker   . Smokeless tobacco: Never Used  . Alcohol Use: Yes     Comment: RARELY  . Drug Use: No  . Sexual Activity: Yes    Birth Control/ Protection: Post-menopausal   Other Topics Concern  . Not on file   Social History Narrative     Review of Systems: General: negative for chills, fever, night sweats or weight changes.  Cardiovascular: negative for chest pain, dyspnea on exertion, edema, orthopnea, palpitations, paroxysmal nocturnal dyspnea or shortness of breath Dermatological: negative for rash Respiratory: negative for cough or wheezing Urologic: negative for hematuria Abdominal: negative for nausea, vomiting, diarrhea, bright red blood per rectum, melena, or hematemesis Neurologic: negative for visual changes, syncope, or dizziness All other systems reviewed and are otherwise negative except as noted above.    Blood pressure 134/64, pulse 64, height 5' 6.5" (1.689 m), weight 161 lb 3.2 oz (73.12 kg).  General appearance: alert and no distress Neck: no adenopathy, no carotid bruit, no JVD, supple, symmetrical, trachea midline and thyroid not enlarged, symmetric, no tenderness/mass/nodules Lungs: clear to auscultation bilaterally Heart: regular rate and rhythm, S1, S2  normal, no murmur, click, rub or gallop Extremities: extremities normal, atraumatic, no cyanosis or edema  EKG normal sinus rhythm at 64 without ST or T-wave changes. I personally reviewed this EKG  ASSESSMENT AND PLAN:   Paroxysmal atrial fibrillation History of paroxysmal atrial fibrillation with 3-4 short episodes in the last year. We have talked about anticoagulation which she prefers not to be on. We discussed the noval  oral anticoagulants.given her infrequent  episodes I feel comfortable keeping her on aspirin alone.   Hyperlipidemia History of hyperlipidemia on lovastatin 20 mg a day. We will recheck a lipid and liver profile   Essential hypertension History of hypertension with blood pressure measures 134/64. She has a hydro-Diuril and metoprolol. Continue current meds at current dosing       Lorretta Harp MD [facility], [facility] 06/14/2014 1:46 PM

## 2014-06-18 LAB — LIPID PANEL W/O CHOL/HDL RATIO
CHOLESTEROL TOTAL: 149 mg/dL (ref 100–199)
HDL: 60 mg/dL (ref >39)
LDL CALC: 67 mg/dL (ref 0–99)
TRIGLYCERIDES: 111 mg/dL (ref 0–149)
VLDL Cholesterol Cal: 22 mg/dL (ref 5–40)

## 2014-06-18 LAB — HEPATIC FUNCTION PANEL
ALK PHOS: 56 IU/L (ref 39–117)
ALT: 12 IU/L (ref 0–32)
AST: 25 IU/L (ref 0–40)
Albumin: 4.6 g/dL (ref 3.5–4.8)
BILIRUBIN, DIRECT: 0.16 mg/dL (ref 0.00–0.40)
Bilirubin Total: 0.6 mg/dL (ref 0.0–1.2)
Total Protein: 7.7 g/dL (ref 6.0–8.5)

## 2014-06-22 ENCOUNTER — Other Ambulatory Visit: Payer: Self-pay | Admitting: Cardiovascular Disease

## 2014-06-23 NOTE — Telephone Encounter (Signed)
Rx has been sent to the pharmacy electronically. ° °

## 2014-07-13 ENCOUNTER — Other Ambulatory Visit: Payer: Self-pay | Admitting: Cardiovascular Disease

## 2014-07-13 NOTE — Telephone Encounter (Signed)
Rx(s) sent to pharmacy electronically.  

## 2014-09-06 ENCOUNTER — Encounter: Payer: Self-pay | Admitting: Gynecology

## 2014-09-06 ENCOUNTER — Ambulatory Visit (INDEPENDENT_AMBULATORY_CARE_PROVIDER_SITE_OTHER): Payer: Medicare Other | Admitting: Gynecology

## 2014-09-06 VITALS — BP 112/70

## 2014-09-06 DIAGNOSIS — R2232 Localized swelling, mass and lump, left upper limb: Secondary | ICD-10-CM | POA: Diagnosis not present

## 2014-09-06 NOTE — Progress Notes (Signed)
Brittany Weaver 1941-01-22 [id number]        74 y.o.  G3P3003 presents noting several week history of tender area in her left axillary region. His always had some generous tissue there but most recently felt more of a swelling with discomfort. No nipple discharge or other masses. Last mammogram 3 years ago.  Past medical history,surgical history, problem list, medications, allergies, family history and social history were all reviewed and documented in the EPIC chart.  Directed ROS with pertinent positives and negatives documented in the history of present illness/assessment and plan.  Exam: Kim assistant Filed Vitals:   09/06/14 1015  BP: 112/70   General appearance:  Normal Both breast examined lying and sitting. Right without masses retractions discharge adenopathy. Left breast with swelling in the left axillary region consistent with accessory breast tissue. Also little more firm nodularity. No other masses retractions discharge were distinct axillary adenopathy.  Assessment/Plan:  74 y.o. G3P3003 with probable axillary breast tissue in the left adnexa some nodularity which I think is fibroglandular changes. No distinct masses. Will start with diagnostic mammography and ultrasound over this area. Regardless we will also schedule a general surgical appointment as this bothers the patient and she would consider having this area excised as it rubs against her bra regardless if it appears benign on studies.  Patient has her annual exam scheduled with me at the end of this month.    Anastasio Auerbach MD, 10:50 AM 09/06/2014

## 2014-09-06 NOTE — Patient Instructions (Signed)
Office will contact you to arrange the mammogram and ultrasound as well as the appointment with general surgeon.

## 2014-09-07 ENCOUNTER — Telehealth: Payer: Self-pay | Admitting: *Deleted

## 2014-09-07 DIAGNOSIS — R2232 Localized swelling, mass and lump, left upper limb: Secondary | ICD-10-CM

## 2014-09-07 NOTE — Telephone Encounter (Signed)
Orders placed for breast center they will contact pt to schedule. Will wait until patient is schedule at breast center they schedule general surgery appointment

## 2014-09-07 NOTE — Telephone Encounter (Signed)
-----   Message from Anastasio Auerbach, MD sent at 09/06/2014 10:53 AM EDT ----- #1  Schedule bilateral diagnostic mammography, left axillary ultrasound reference left tail of Spence nodularity and swelling  #2  Schedule an appointment with general surgery reference Left axillary accessory breast tissue that the patient wants excised

## 2014-09-12 NOTE — Telephone Encounter (Signed)
Left message for pt to call regarding Dr.Blackmon on 09/27/14 @ 9:50am

## 2014-09-12 NOTE — Telephone Encounter (Signed)
appointment 09/15/14 @ 1:40pm at breast center

## 2014-09-12 NOTE — Telephone Encounter (Signed)
Pt informed with the below note. 

## 2014-09-15 ENCOUNTER — Other Ambulatory Visit: Payer: Medicare Other

## 2014-09-19 ENCOUNTER — Ambulatory Visit
Admission: RE | Admit: 2014-09-19 | Discharge: 2014-09-19 | Disposition: A | Discharge status: Home / Self Care | Payer: Medicare Other | Source: Ambulatory Visit | Attending: Gynecology | Admitting: Gynecology

## 2014-09-19 ENCOUNTER — Other Ambulatory Visit: Payer: Self-pay | Admitting: Gynecology

## 2014-09-19 DIAGNOSIS — R2232 Localized swelling, mass and lump, left upper limb: Secondary | ICD-10-CM

## 2014-09-27 ENCOUNTER — Other Ambulatory Visit: Payer: Self-pay | Admitting: Surgery

## 2014-09-27 ENCOUNTER — Other Ambulatory Visit (HOSPITAL_COMMUNITY)
Admission: RE | Admit: 2014-09-27 | Discharge: 2014-09-27 | Disposition: A | Discharge status: Home / Self Care | Payer: Medicare Other | Source: Ambulatory Visit | Attending: Gynecology | Admitting: Gynecology

## 2014-09-27 ENCOUNTER — Encounter: Payer: Medicare Other | Admitting: Gynecology

## 2014-09-27 ENCOUNTER — Encounter: Payer: Self-pay | Admitting: Gynecology

## 2014-09-27 ENCOUNTER — Ambulatory Visit (INDEPENDENT_AMBULATORY_CARE_PROVIDER_SITE_OTHER): Payer: Medicare Other | Admitting: Gynecology

## 2014-09-27 VITALS — BP 122/78 | Ht 66.5 in | Wt 161.0 lb

## 2014-09-27 DIAGNOSIS — N952 Postmenopausal atrophic vaginitis: Secondary | ICD-10-CM

## 2014-09-27 DIAGNOSIS — N816 Rectocele: Secondary | ICD-10-CM | POA: Insufficient documentation

## 2014-09-27 DIAGNOSIS — N8189 Other female genital prolapse: Secondary | ICD-10-CM

## 2014-09-27 DIAGNOSIS — Z124 Encounter for screening for malignant neoplasm of cervix: Secondary | ICD-10-CM | POA: Diagnosis present

## 2014-09-27 DIAGNOSIS — Z01419 Encounter for gynecological examination (general) (routine) without abnormal findings: Secondary | ICD-10-CM

## 2014-09-27 DIAGNOSIS — M8580 Other specified disorders of bone density and structure, unspecified site: Secondary | ICD-10-CM

## 2014-09-27 MED ORDER — ESTRADIOL 10 MCG VA TABS: 1.0000 | ORAL_TABLET | VAGINAL | Status: DC

## 2014-09-27 NOTE — Addendum Note (Signed)
Addended by: Nelva Nay on: 09/27/2014 02:25 PM   Modules accepted: Orders

## 2014-09-27 NOTE — Progress Notes (Signed)
Brittany Weaver 1941-01-09 [id number]        73 y.o.  [id number] for breast and pelvic exam. Several issues noted below.  Past medical history,surgical history, problem list, medications, allergies, family history and social history were all reviewed and documented as reviewed in the EPIC chart.  ROS:  Performed with pertinent positives and negatives included in the history, assessment and plan.   Additional significant findings :  none   Exam: Kim Counsellor Vitals:   09/27/14 1345  BP: 122/78  Height: 5' 6.5" (1.689 m)  Weight: 161 lb (73.029 kg)   General appearance:  Normal affect, orientation and appearance. Skin: Grossly normal HEENT: Without gross lesions.  No cervical or supraclavicular adenopathy. Thyroid normal.  Lungs:  Clear without wheezing, rales or rhonchi Cardiac: RR, without RMG Abdominal:  Soft, nontender, without masses, guarding, rebound, organomegaly or hernia Breasts:  Examined lying and sitting without masses, retractions, discharge or axillary adenopathy.  Fullness in the left axillary region as noted recently 09/06/2014 exam. Pelvic:  Ext/BUS/vagina with generalized atrophic changes.  Mild cystocele/rectocele and uterine prolapse.  Cervix atrophic. Pap smear done  Uterus anteverted, normal size, shape and contour, midline and mobile nontender   Adnexa  Without masses or tenderness    Anus and perineum  Normal   Rectovaginal  Normal sphincter tone without palpated masses or tenderness.    Assessment/Plan:  74 y.o. [id number] female for breast and pelvic exam.   1. Atrophic genital changes. Using Vagifem twice weekly with good results and she wants to continue. I again reviewed the issues and risks to include absorption and increased risk of stroke heart attack DVT breast cancer and endometrial stimulation. She is no history of vaginal bleeding. Patient's comfortable continuing and I refilled her 1 year. Call if any vaginal bleeding. 2. Pelvic relaxation.  Patient with mild cystocele/rectocele/uterine prolapse. Asymptomatic to the patient. Does not want any intervention and will continue to follow and report any symptoms of they develop. 3. Left axillary fullness. Mammogram/ultrasound recently negative. Saw Dr. Ninfa Linden and they discussed excising this tissue. Can follow up with him in reference to this.  Continuing with SBE monthly reviewed. Continuing with annual mammography discussed 4. Osteopenia. DEXA 2013 T score -1.4. Stable from prior DEXA 2007. Will plan repeat DEXA in 1-2 years. Increased calcium vitamin D reviewed. 5. Pap smear 2011. We discussed stop screening given her age and no history of abnormal Pap smears. She feels a little uncomfortable with this and requests Pap smear this year. Pap smear done. 6. Colonoscopy 2015. Repeat at their recommended interval. 7. Health maintenance. No routine blood work done as patient reports this done at her primary physician's office. Follow up in one year, sooner as needed.     Anastasio Auerbach MD, 2:17 PM 09/27/2014

## 2014-09-27 NOTE — Patient Instructions (Signed)
You may obtain a copy of any labs that were done today by logging onto MyChart as outlined in the instructions provided with your AVS (after visit summary). The office will not call with normal lab results but certainly if there are any significant abnormalities then we will contact you.   Health Maintenance, Female A healthy lifestyle and preventative care can promote health and wellness.  Maintain regular health, dental, and eye exams.  Eat a healthy diet. Foods like vegetables, fruits, whole grains, low-fat dairy products, and lean protein foods contain the nutrients you need without too many calories. Decrease your intake of foods high in solid fats, added sugars, and salt. Get information about a proper diet from your caregiver, if necessary.  Regular physical exercise is one of the most important things you can do for your health. Most adults should get at least 150 minutes of moderate-intensity exercise (any activity that increases your heart rate and causes you to sweat) each week. In addition, most adults need muscle-strengthening exercises on 2 or more days a week.   Maintain a healthy weight. The body mass index (BMI) is a screening tool to identify possible weight problems. It provides an estimate of body fat based on height and weight. Your caregiver can help determine your BMI, and can help you achieve or maintain a healthy weight. For adults 20 years and older:  A BMI below 18.5 is considered underweight.  A BMI of 18.5 to 24.9 is normal.  A BMI of 25 to 29.9 is considered overweight.  A BMI of 30 and above is considered obese.  Maintain normal blood lipids and cholesterol by exercising and minimizing your intake of saturated fat. Eat a balanced diet with plenty of fruits and vegetables. Blood tests for lipids and cholesterol should begin at age 61 and be repeated every 5 years. If your lipid or cholesterol levels are high, you are over 50, or you are a high risk for heart  disease, you may need your cholesterol levels checked more frequently.Ongoing high lipid and cholesterol levels should be treated with medicines if diet and exercise are not effective.  If you smoke, find out from your caregiver how to quit. If you do not use tobacco, do not start.  Lung cancer screening is recommended for adults aged 33 80 years who are at high risk for developing lung cancer because of a history of smoking. Yearly low-dose computed tomography (CT) is recommended for people who have at least a 30-pack-year history of smoking and are a current smoker or have quit within the past 15 years. A pack year of smoking is smoking an average of 1 pack of cigarettes a day for 1 year (for example: 1 pack a day for 30 years or 2 packs a day for 15 years). Yearly screening should continue until the smoker has stopped smoking for at least 15 years. Yearly screening should also be stopped for people who develop a health problem that would prevent them from having lung cancer treatment.  If you are pregnant, do not drink alcohol. If you are breastfeeding, be very cautious about drinking alcohol. If you are not pregnant and choose to drink alcohol, do not exceed 1 drink per day. One drink is considered to be 12 ounces (355 mL) of beer, 5 ounces (148 mL) of wine, or 1.5 ounces (44 mL) of liquor.  Avoid use of street drugs. Do not share needles with anyone. Ask for help if you need support or instructions about stopping  the use of drugs.  High blood pressure causes heart disease and increases the risk of stroke. Blood pressure should be checked at least every 1 to 2 years. Ongoing high blood pressure should be treated with medicines, if weight loss and exercise are not effective.  If you are 59 to 75 years old, ask your caregiver if you should take aspirin to prevent strokes.  Diabetes screening involves taking a blood sample to check your fasting blood sugar level. This should be done once every 3  years, after age 91, if you are within normal weight and without risk factors for diabetes. Testing should be considered at a younger age or be carried out more frequently if you are overweight and have at least 1 risk factor for diabetes.  Breast cancer screening is essential preventative care for women. You should practice "breast self-awareness." This means understanding the normal appearance and feel of your breasts and may include breast self-examination. Any changes detected, no matter how small, should be reported to a caregiver. Women in their 66s and 30s should have a clinical breast exam (CBE) by a caregiver as part of a regular health exam every 1 to 3 years. After age 101, women should have a CBE every year. Starting at age 100, women should consider having a mammogram (breast X-ray) every year. Women who have a family history of breast cancer should talk to their caregiver about genetic screening. Women at a high risk of breast cancer should talk to their caregiver about having an MRI and a mammogram every year.  Breast cancer gene (BRCA)-related cancer risk assessment is recommended for women who have family members with BRCA-related cancers. BRCA-related cancers include breast, ovarian, tubal, and peritoneal cancers. Having family members with these cancers may be associated with an increased risk for harmful changes (mutations) in the breast cancer genes BRCA1 and BRCA2. Results of the assessment will determine the need for genetic counseling and BRCA1 and BRCA2 testing.  The Pap test is a screening test for cervical cancer. Women should have a Pap test starting at age 57. Between ages 25 and 35, Pap tests should be repeated every 2 years. Beginning at age 37, you should have a Pap test every 3 years as long as the past 3 Pap tests have been normal. If you had a hysterectomy for a problem that was not cancer or a condition that could lead to cancer, then you no longer need Pap tests. If you are  between ages 50 and 76, and you have had normal Pap tests going back 10 years, you no longer need Pap tests. If you have had past treatment for cervical cancer or a condition that could lead to cancer, you need Pap tests and screening for cancer for at least 20 years after your treatment. If Pap tests have been discontinued, risk factors (such as a new sexual partner) need to be reassessed to determine if screening should be resumed. Some women have medical problems that increase the chance of getting cervical cancer. In these cases, your caregiver may recommend more frequent screening and Pap tests.  The human papillomavirus (HPV) test is an additional test that may be used for cervical cancer screening. The HPV test looks for the virus that can cause the cell changes on the cervix. The cells collected during the Pap test can be tested for HPV. The HPV test could be used to screen women aged 44 years and older, and should be used in women of any age  who have unclear Pap test results. After the age of 30, women should have HPV testing at the same frequency as a Pap test.  Colorectal cancer can be detected and often prevented. Most routine colorectal cancer screening begins at the age of 50 and continues through age 75. However, your caregiver may recommend screening at an earlier age if you have risk factors for colon cancer. On a yearly basis, your caregiver may provide home test kits to check for hidden blood in the stool. Use of a small camera at the end of a tube, to directly examine the colon (sigmoidoscopy or colonoscopy), can detect the earliest forms of colorectal cancer. Talk to your caregiver about this at age 50, when routine screening begins. Direct examination of the colon should be repeated every 5 to 10 years through age 75, unless early forms of pre-cancerous polyps or small growths are found.  Hepatitis C blood testing is recommended for all people born from 1945 through 1965 and any  individual with known risks for hepatitis C.  Practice safe sex. Use condoms and avoid high-risk sexual practices to reduce the spread of sexually transmitted infections (STIs). Sexually active women aged 25 and younger should be checked for Chlamydia, which is a common sexually transmitted infection. Older women with new or multiple partners should also be tested for Chlamydia. Testing for other STIs is recommended if you are sexually active and at increased risk.  Osteoporosis is a disease in which the bones lose minerals and strength with aging. This can result in serious bone fractures. The risk of osteoporosis can be identified using a bone density scan. Women ages 65 and over and women at risk for fractures or osteoporosis should discuss screening with their caregivers. Ask your caregiver whether you should be taking a calcium supplement or vitamin D to reduce the rate of osteoporosis.  Menopause can be associated with physical symptoms and risks. Hormone replacement therapy is available to decrease symptoms and risks. You should talk to your caregiver about whether hormone replacement therapy is right for you.  Use sunscreen. Apply sunscreen liberally and repeatedly throughout the day. You should seek shade when your shadow is shorter than you. Protect yourself by wearing long sleeves, pants, a wide-brimmed hat, and sunglasses year round, whenever you are outdoors.  Notify your caregiver of new moles or changes in moles, especially if there is a change in shape or color. Also notify your caregiver if a mole is larger than the size of a pencil eraser.  Stay current with your immunizations. Document Released: 10/29/2010 Document Revised: 08/10/2012 Document Reviewed: 10/29/2010 [employer] Patient Information 2014 [employer].   

## 2014-09-28 LAB — URINALYSIS W MICROSCOPIC + REFLEX CULTURE
Bilirubin Urine: NEGATIVE
Casts: NONE SEEN
Crystals: NONE SEEN
GLUCOSE, UA: NEGATIVE mg/dL
HGB URINE DIPSTICK: NEGATIVE
Ketones, ur: NEGATIVE mg/dL
NITRITE: NEGATIVE
Protein, ur: NEGATIVE mg/dL
Specific Gravity, Urine: <1.005 — ABNORMAL LOW (ref 1.005–1.030)
UROBILINOGEN UA: 0.2 mg/dL (ref 0.0–1.0)
pH: 5 (ref 5.0–8.0)

## 2014-09-29 LAB — CYTOLOGY - PAP

## 2014-09-29 LAB — URINE CULTURE
COLONY COUNT: NO GROWTH
Organism ID, Bacteria: NO GROWTH

## 2014-10-04 ENCOUNTER — Encounter (INDEPENDENT_AMBULATORY_CARE_PROVIDER_SITE_OTHER): Payer: Medicare Other | Admitting: Ophthalmology

## 2014-10-04 DIAGNOSIS — D3132 Benign neoplasm of left choroid: Secondary | ICD-10-CM

## 2014-10-04 DIAGNOSIS — I10 Essential (primary) hypertension: Secondary | ICD-10-CM | POA: Diagnosis not present

## 2014-10-04 DIAGNOSIS — H35033 Hypertensive retinopathy, bilateral: Secondary | ICD-10-CM

## 2014-10-04 DIAGNOSIS — H35342 Macular cyst, hole, or pseudohole, left eye: Secondary | ICD-10-CM | POA: Diagnosis not present

## 2014-10-04 DIAGNOSIS — H43813 Vitreous degeneration, bilateral: Secondary | ICD-10-CM

## 2014-10-06 ENCOUNTER — Encounter: Payer: Self-pay | Admitting: *Deleted

## 2014-10-26 ENCOUNTER — Telehealth: Payer: Self-pay

## 2014-10-26 NOTE — Telephone Encounter (Signed)
Request for surgical clearance: 1. What type of surgery is being performed? Retina surgery  2. When is this surgery scheduled? 11/22/14  3. Are there any medications that need to be held prior to surgery and how long? Aspirin 81, 5 days  4. Name of physician performing surgery? Dr Tempie Hoist at the [facility] 5. What is the office phone and fax number? Phone - [phone]  Fax - [phone]

## 2014-10-28 NOTE — Telephone Encounter (Signed)
Labs routed to Dr Zigmund Daniel, EKG faxed to [phone].

## 2014-10-28 NOTE — Telephone Encounter (Signed)
Okay to hold aspirin for retinal surgery

## 2014-11-02 NOTE — H&P (Signed)
Brittany Weaver is an 74 y.o. female.   Chief Complaint:Loss of vision left eye HPI: Loss of vision left eye over three months from macular hole  Past Medical History  Diagnosis Date  . Mitral valve prolapse   . Hypertension   . Hypercholesterolemia   . Paroxysmal atrial fibrillation   . Osteopenia 2013    T score -1.4 stable from prior DEXA 2007 no FRAX calculated  . Pelvic relaxation     Mild and asymptomatic.    Past Surgical History  Procedure Laterality Date  . Dilation and curettage of uterus      PT. DOES NOT REMEMBER THE YEAR  . Cataract extraction, bilateral      Dr Herbert Deaner  . Doppler echocardiography  05/13/2007  . Myoview   06/05/2009    Family History  Problem Relation Age of Onset  . Stroke Mother   . Heart disease Mother   . Kidney disease Mother   . Hypertension Mother   . Hyperlipidemia Mother   . Stroke Brother   . Heart disease Brother   . Stroke Brother   . Heart disease Brother   . Heart failure Sister   . Cancer Sister     Esophagus/stomach  . Stroke Sister   . Heart disease Sister   . Heart disease Father   . Colon cancer Neg Hx   . Rectal cancer Neg Hx    Social History:  reports that she has never smoked. She has never used smokeless tobacco. She reports that she drinks alcohol. She reports that she does not use illicit drugs.  Allergies:  Allergies  Allergen Reactions  . Penicillins     HIVES  . Prednisone     DIZZINESS and swelling    No prescriptions prior to admission    Review of systems otherwise negative  There were no vitals taken for this visit.  Physical exam: Mental status: oriented x3. Eyes: See eye exam associated with this date of surgery in media tab.  Scanned in by scanning center Ears, Nose, Throat: within normal limits Neck: Within Normal limits General: within normal limits Chest: Within normal limits Breast: deferred Heart: Within normal limits Abdomen: Within normal limits GU: deferred Extremities:  within normal limits Skin: within normal limits  Assessment/Plan Macular hole left eye Plan: To [facility] for Pars plana vitrectomy, serum patch, membrane peel, gas injection left eye  MATTHEWS, JOHN D 11/02/2014, 8:29 AM

## 2014-11-10 ENCOUNTER — Encounter (INDEPENDENT_AMBULATORY_CARE_PROVIDER_SITE_OTHER): Payer: Medicare Other | Admitting: Ophthalmology

## 2014-11-21 ENCOUNTER — Encounter (HOSPITAL_COMMUNITY): Payer: Self-pay | Admitting: *Deleted

## 2014-11-22 ENCOUNTER — Ambulatory Visit (HOSPITAL_COMMUNITY): Payer: Medicare Other | Admitting: Anesthesiology

## 2014-11-22 ENCOUNTER — Encounter (INDEPENDENT_AMBULATORY_CARE_PROVIDER_SITE_OTHER): Payer: Medicare Other | Admitting: Ophthalmology

## 2014-11-22 ENCOUNTER — Encounter (HOSPITAL_COMMUNITY): Payer: Self-pay | Admitting: *Deleted

## 2014-11-22 ENCOUNTER — Ambulatory Visit (HOSPITAL_COMMUNITY)
Admission: RE | Admit: 2014-11-22 | Discharge: 2014-11-23 | Disposition: A | Discharge status: Home / Self Care | Payer: Medicare Other | Source: Ambulatory Visit | Attending: Ophthalmology | Admitting: Ophthalmology

## 2014-11-22 ENCOUNTER — Encounter (HOSPITAL_COMMUNITY)
Admission: RE | Disposition: A | Discharge status: Home / Self Care | Payer: Self-pay | Source: Ambulatory Visit | Attending: Ophthalmology

## 2014-11-22 DIAGNOSIS — I10 Essential (primary) hypertension: Secondary | ICD-10-CM | POA: Insufficient documentation

## 2014-11-22 DIAGNOSIS — D3132 Benign neoplasm of left choroid: Secondary | ICD-10-CM

## 2014-11-22 DIAGNOSIS — H43813 Vitreous degeneration, bilateral: Secondary | ICD-10-CM

## 2014-11-22 DIAGNOSIS — I480 Paroxysmal atrial fibrillation: Secondary | ICD-10-CM | POA: Diagnosis not present

## 2014-11-22 DIAGNOSIS — E780 Pure hypercholesterolemia: Secondary | ICD-10-CM | POA: Insufficient documentation

## 2014-11-22 DIAGNOSIS — Z79899 Other long term (current) drug therapy: Secondary | ICD-10-CM | POA: Insufficient documentation

## 2014-11-22 DIAGNOSIS — H35342 Macular cyst, hole, or pseudohole, left eye: Secondary | ICD-10-CM

## 2014-11-22 DIAGNOSIS — M8580 Other specified disorders of bone density and structure, unspecified site: Secondary | ICD-10-CM | POA: Insufficient documentation

## 2014-11-22 DIAGNOSIS — Z880 Allergy status to penicillin: Secondary | ICD-10-CM | POA: Insufficient documentation

## 2014-11-22 DIAGNOSIS — I341 Nonrheumatic mitral (valve) prolapse: Secondary | ICD-10-CM | POA: Diagnosis not present

## 2014-11-22 DIAGNOSIS — N8189 Other female genital prolapse: Secondary | ICD-10-CM | POA: Diagnosis not present

## 2014-11-22 DIAGNOSIS — H35033 Hypertensive retinopathy, bilateral: Secondary | ICD-10-CM | POA: Diagnosis not present

## 2014-11-22 DIAGNOSIS — Z888 Allergy status to other drugs, medicaments and biological substances status: Secondary | ICD-10-CM | POA: Insufficient documentation

## 2014-11-22 HISTORY — PX: SERUM PATCH: SHX6091

## 2014-11-22 HISTORY — DX: Macular cyst, hole, or pseudohole, left eye: H35.342

## 2014-11-22 HISTORY — PX: 25 GAUGE PARS PLANA VITRECTOMY WITH 20 GAUGE MVR PORT FOR MACULAR HOLE: SHX6096

## 2014-11-22 HISTORY — PX: MEMBRANE PEEL: SHX5967

## 2014-11-22 HISTORY — PX: GAS/FLUID EXCHANGE: SHX5334

## 2014-11-22 HISTORY — DX: Nausea with vomiting, unspecified: R11.2

## 2014-11-22 HISTORY — PX: LASER PHOTO ABLATION: SHX5942

## 2014-11-22 HISTORY — DX: Other specified postprocedural states: Z98.890

## 2014-11-22 LAB — BASIC METABOLIC PANEL
ANION GAP: 9 (ref 5–15)
BUN: 27 mg/dL — AB (ref 6–20)
CO2: 25 mmol/L (ref 22–32)
Calcium: 9.4 mg/dL (ref 8.9–10.3)
Chloride: 104 mmol/L (ref 101–111)
Creatinine, Ser: 1.11 mg/dL — ABNORMAL HIGH (ref 0.44–1.00)
GFR calc Af Amer: 55 mL/min — ABNORMAL LOW (ref >60)
GFR calc non Af Amer: 48 mL/min — ABNORMAL LOW (ref >60)
GLUCOSE: 102 mg/dL — AB (ref 65–99)
Potassium: 4.5 mmol/L (ref 3.5–5.1)
Sodium: 138 mmol/L (ref 135–145)

## 2014-11-22 LAB — CBC
HCT: 39.2 % (ref 36.0–46.0)
Hemoglobin: 13.4 g/dL (ref 12.0–15.0)
MCH: 29.6 pg (ref 26.0–34.0)
MCHC: 34.2 g/dL (ref 30.0–36.0)
MCV: 86.7 fL (ref 78.0–100.0)
Platelets: 159 10*3/uL (ref 150–400)
RBC: 4.52 MIL/uL (ref 3.87–5.11)
RDW: 12.7 % (ref 11.5–15.5)
WBC: 7.1 10*3/uL (ref 4.0–10.5)

## 2014-11-22 LAB — AUTOLOGOUS SERUM PATCH PREP

## 2014-11-22 SURGERY — 25 GAUGE PARS PLANA VITRECTOMY WITH 20 GAUGE MVR PORT FOR MACULAR HOLE
Anesthesia: General | Site: Eye | Laterality: Left

## 2014-11-22 MED ORDER — MIDAZOLAM HCL 2 MG/2ML IJ SOLN
INTRAMUSCULAR | Status: AC
  Filled 2014-11-22: qty 2

## 2014-11-22 MED ORDER — TRIAMCINOLONE ACETONIDE 40 MG/ML IJ SUSP
INTRAMUSCULAR | Status: AC
  Filled 2014-11-22: qty 5

## 2014-11-22 MED ORDER — BUPIVACAINE HCL (PF) 0.75 % IJ SOLN
INTRAMUSCULAR | Status: DC | PRN
  Administered 2014-11-22: 20 mL

## 2014-11-22 MED ORDER — GLYCOPYRROLATE 0.2 MG/ML IJ SOLN
INTRAMUSCULAR | Status: DC | PRN
  Administered 2014-11-22: 0.2 mg via INTRAVENOUS
  Administered 2014-11-22: .8 mg via INTRAVENOUS

## 2014-11-22 MED ORDER — CLINDAMYCIN PHOSPHATE 600 MG/50ML IV SOLN
600.0000 mg | INTRAVENOUS | Status: DC | PRN
  Administered 2014-11-22: 600 mg via INTRAVENOUS
  Filled 2014-11-22: qty 50

## 2014-11-22 MED ORDER — MORPHINE SULFATE 2 MG/ML IJ SOLN: 1.0000 mg | INTRAMUSCULAR | Status: DC | PRN

## 2014-11-22 MED ORDER — PROPOFOL 10 MG/ML IV BOLUS
INTRAVENOUS | Status: AC
  Filled 2014-11-22: qty 20

## 2014-11-22 MED ORDER — VITAMIN B-12 1000 MCG PO TABS
1000.0000 ug | ORAL_TABLET | Freq: Every day | ORAL | Status: DC
  Administered 2014-11-22: 1000 ug via ORAL
  Filled 2014-11-22: qty 1

## 2014-11-22 MED ORDER — PHENYLEPHRINE HCL 2.5 % OP SOLN
1.0000 [drp] | OPHTHALMIC | Status: AC | PRN
  Administered 2014-11-22 (×3): 1 [drp] via OPHTHALMIC
  Filled 2014-11-22: qty 2

## 2014-11-22 MED ORDER — ONDANSETRON HCL 4 MG/2ML IJ SOLN
INTRAMUSCULAR | Status: DC | PRN
  Administered 2014-11-22: 4 mg via INTRAVENOUS

## 2014-11-22 MED ORDER — EPHEDRINE SULFATE 50 MG/ML IJ SOLN
INTRAMUSCULAR | Status: AC
  Filled 2014-11-22: qty 1

## 2014-11-22 MED ORDER — SODIUM CHLORIDE 0.9 % IJ SOLN
INTRAMUSCULAR | Status: DC | PRN
  Administered 2014-11-22: 11:00:00

## 2014-11-22 MED ORDER — GLYCOPYRROLATE 0.2 MG/ML IJ SOLN
INTRAMUSCULAR | Status: AC
  Filled 2014-11-22: qty 1

## 2014-11-22 MED ORDER — SODIUM HYALURONATE 10 MG/ML IO SOLN
INTRAOCULAR | Status: DC | PRN
  Administered 2014-11-22: 0.85 mL via INTRAOCULAR

## 2014-11-22 MED ORDER — SODIUM CHLORIDE 0.9 % IJ SOLN
INTRAMUSCULAR | Status: AC
  Filled 2014-11-22: qty 10

## 2014-11-22 MED ORDER — BACITRACIN-POLYMYXIN B 500-10000 UNIT/GM OP OINT
TOPICAL_OINTMENT | OPHTHALMIC | Status: AC
  Filled 2014-11-22: qty 3.5

## 2014-11-22 MED ORDER — BSS IO SOLN
INTRAOCULAR | Status: AC
  Filled 2014-11-22: qty 15

## 2014-11-22 MED ORDER — HYALURONIDASE HUMAN 150 UNIT/ML IJ SOLN
INTRAMUSCULAR | Status: AC
  Filled 2014-11-22: qty 1

## 2014-11-22 MED ORDER — CYCLOPENTOLATE HCL 1 % OP SOLN
1.0000 [drp] | OPHTHALMIC | Status: AC | PRN
  Administered 2014-11-22 (×3): 1 [drp] via OPHTHALMIC
  Filled 2014-11-22: qty 2

## 2014-11-22 MED ORDER — TEMAZEPAM 15 MG PO CAPS: 15.0000 mg | ORAL_CAPSULE | Freq: Every evening | ORAL | Status: DC | PRN

## 2014-11-22 MED ORDER — BUPIVACAINE HCL (PF) 0.75 % IJ SOLN
INTRAMUSCULAR | Status: AC
  Filled 2014-11-22: qty 10

## 2014-11-22 MED ORDER — DEXAMETHASONE SODIUM PHOSPHATE 10 MG/ML IJ SOLN
INTRAMUSCULAR | Status: DC | PRN
  Administered 2014-11-22: 10 mg via INTRAVENOUS

## 2014-11-22 MED ORDER — LIDOCAINE HCL (CARDIAC) 20 MG/ML IV SOLN
INTRAVENOUS | Status: DC | PRN
  Administered 2014-11-22: 60 mg via INTRAVENOUS
  Administered 2014-11-22: 100 mg via INTRATRACHEAL

## 2014-11-22 MED ORDER — HYPROMELLOSE (GONIOSCOPIC) 2.5 % OP SOLN
OPHTHALMIC | Status: AC
  Filled 2014-11-22: qty 15

## 2014-11-22 MED ORDER — LATANOPROST 0.005 % OP SOLN
1.0000 [drp] | Freq: Every day | OPHTHALMIC | Status: DC
  Filled 2014-11-22: qty 2.5

## 2014-11-22 MED ORDER — ACETAMINOPHEN 325 MG PO TABS: 325.0000 mg | ORAL_TABLET | ORAL | Status: DC | PRN

## 2014-11-22 MED ORDER — SCOPOLAMINE 1 MG/3DAYS TD PT72
MEDICATED_PATCH | TRANSDERMAL | Status: AC
  Filled 2014-11-22: qty 1

## 2014-11-22 MED ORDER — BACITRACIN-POLYMYXIN B 500-10000 UNIT/GM OP OINT
TOPICAL_OINTMENT | OPHTHALMIC | Status: DC | PRN
  Administered 2014-11-22: 1 via OPHTHALMIC

## 2014-11-22 MED ORDER — BACITRACIN-POLYMYXIN B 500-10000 UNIT/GM OP OINT
1.0000 "application " | TOPICAL_OINTMENT | Freq: Three times a day (TID) | OPHTHALMIC | Status: DC
  Filled 2014-11-22: qty 3.5

## 2014-11-22 MED ORDER — LOTEPREDNOL ETABONATE 0.5 % OP SUSP
1.0000 [drp] | Freq: Four times a day (QID) | OPHTHALMIC | Status: DC
  Filled 2014-11-22: qty 5

## 2014-11-22 MED ORDER — HYDROCHLOROTHIAZIDE 25 MG PO TABS
25.0000 mg | ORAL_TABLET | Freq: Every day | ORAL | Status: DC
Start: 2014-11-23 — End: 2014-11-23

## 2014-11-22 MED ORDER — HYDROCODONE-ACETAMINOPHEN 5-325 MG PO TABS: 1.0000 | ORAL_TABLET | ORAL | Status: DC | PRN

## 2014-11-22 MED ORDER — GATIFLOXACIN 0.5 % OP SOLN
1.0000 [drp] | Freq: Four times a day (QID) | OPHTHALMIC | Status: DC
  Filled 2014-11-22: qty 2.5

## 2014-11-22 MED ORDER — EPINEPHRINE HCL 1 MG/ML IJ SOLN
INTRAMUSCULAR | Status: AC
  Filled 2014-11-22: qty 1

## 2014-11-22 MED ORDER — BSS PLUS IO SOLN
INTRAOCULAR | Status: AC
  Filled 2014-11-22: qty 500

## 2014-11-22 MED ORDER — ESTRADIOL 10 MCG VA TABS: 1.0000 | ORAL_TABLET | VAGINAL | Status: DC

## 2014-11-22 MED ORDER — SODIUM HYALURONATE 10 MG/ML IO SOLN
INTRAOCULAR | Status: AC
  Filled 2014-11-22: qty 0.85

## 2014-11-22 MED ORDER — TROPICAMIDE 1 % OP SOLN
1.0000 [drp] | OPHTHALMIC | Status: AC | PRN
  Administered 2014-11-22 (×3): 1 [drp] via OPHTHALMIC
  Filled 2014-11-22: qty 3

## 2014-11-22 MED ORDER — POLYMYXIN B SULFATE 500000 UNITS IJ SOLR
INTRAMUSCULAR | Status: AC
  Filled 2014-11-22: qty 1

## 2014-11-22 MED ORDER — PROMETHAZINE HCL 25 MG/ML IJ SOLN: 6.2500 mg | INTRAMUSCULAR | Status: DC | PRN

## 2014-11-22 MED ORDER — BRIMONIDINE TARTRATE 0.2 % OP SOLN
1.0000 [drp] | Freq: Two times a day (BID) | OPHTHALMIC | Status: DC
  Filled 2014-11-22: qty 5

## 2014-11-22 MED ORDER — ONDANSETRON HCL 4 MG/2ML IJ SOLN
INTRAMUSCULAR | Status: AC
  Filled 2014-11-22: qty 2

## 2014-11-22 MED ORDER — DEXAMETHASONE SODIUM PHOSPHATE 10 MG/ML IJ SOLN
INTRAMUSCULAR | Status: AC
  Filled 2014-11-22: qty 1

## 2014-11-22 MED ORDER — FENTANYL CITRATE (PF) 250 MCG/5ML IJ SOLN
INTRAMUSCULAR | Status: AC
  Filled 2014-11-22: qty 5

## 2014-11-22 MED ORDER — ATROPINE SULFATE 1 % OP SOLN
OPHTHALMIC | Status: AC
Start: 2014-11-22 — End: 2014-11-22
  Filled 2014-11-22: qty 5

## 2014-11-22 MED ORDER — ROCURONIUM BROMIDE 100 MG/10ML IV SOLN
INTRAVENOUS | Status: DC | PRN
  Administered 2014-11-22: 50 mg via INTRAVENOUS

## 2014-11-22 MED ORDER — LIDOCAINE HCL (CARDIAC) 20 MG/ML IV SOLN
INTRAVENOUS | Status: AC
  Filled 2014-11-22: qty 5

## 2014-11-22 MED ORDER — ACETAZOLAMIDE SODIUM 500 MG IJ SOLR
500.0000 mg | Freq: Once | INTRAMUSCULAR | Status: AC
  Administered 2014-11-23: 500 mg via INTRAVENOUS
  Filled 2014-11-22: qty 500

## 2014-11-22 MED ORDER — ONDANSETRON HCL 4 MG/2ML IJ SOLN
4.0000 mg | Freq: Four times a day (QID) | INTRAMUSCULAR | Status: DC
  Filled 2014-11-22: qty 2

## 2014-11-22 MED ORDER — HYDROMORPHONE HCL 1 MG/ML IJ SOLN: 0.2500 mg | INTRAMUSCULAR | Status: DC | PRN

## 2014-11-22 MED ORDER — GLYCOPYRROLATE 0.2 MG/ML IJ SOLN
INTRAMUSCULAR | Status: AC
  Filled 2014-11-22: qty 3

## 2014-11-22 MED ORDER — PRAVASTATIN SODIUM 10 MG PO TABS
10.0000 mg | ORAL_TABLET | Freq: Every day | ORAL | Status: DC
  Administered 2014-11-22: 10 mg via ORAL
  Filled 2014-11-22: qty 1

## 2014-11-22 MED ORDER — KETOROLAC TROMETHAMINE 0.5 % OP SOLN
1.0000 [drp] | Freq: Four times a day (QID) | OPHTHALMIC | Status: DC
  Filled 2014-11-22: qty 3

## 2014-11-22 MED ORDER — METOPROLOL TARTRATE 25 MG PO TABS: 25.0000 mg | ORAL_TABLET | Freq: Two times a day (BID) | ORAL | Status: DC

## 2014-11-22 MED ORDER — NEOSTIGMINE METHYLSULFATE 10 MG/10ML IV SOLN
INTRAVENOUS | Status: DC | PRN
  Administered 2014-11-22: 5 mg via INTRAVENOUS

## 2014-11-22 MED ORDER — SODIUM CHLORIDE 0.45 % IV SOLN: INTRAVENOUS | Status: DC

## 2014-11-22 MED ORDER — GATIFLOXACIN 0.5 % OP SOLN
1.0000 [drp] | OPHTHALMIC | Status: AC | PRN
  Administered 2014-11-22 (×3): 1 [drp] via OPHTHALMIC
  Filled 2014-11-22: qty 2.5

## 2014-11-22 MED ORDER — EPHEDRINE SULFATE 50 MG/ML IJ SOLN
INTRAMUSCULAR | Status: DC | PRN
  Administered 2014-11-22: 5 mg via INTRAVENOUS
  Administered 2014-11-22 (×2): 10 mg via INTRAVENOUS

## 2014-11-22 MED ORDER — SCOPOLAMINE 1 MG/3DAYS TD PT72
1.0000 | MEDICATED_PATCH | TRANSDERMAL | Status: DC
  Administered 2014-11-22: 1.5 mg via TRANSDERMAL

## 2014-11-22 MED ORDER — BSS PLUS IO SOLN
INTRAOCULAR | Status: DC | PRN
  Administered 2014-11-22: 500 mL

## 2014-11-22 MED ORDER — SODIUM CHLORIDE 0.9 % IV SOLN
INTRAVENOUS | Status: DC
  Administered 2014-11-22 (×2): via INTRAVENOUS

## 2014-11-22 MED ORDER — MIDAZOLAM HCL 5 MG/5ML IJ SOLN
INTRAMUSCULAR | Status: DC | PRN
  Administered 2014-11-22: 1 mg via INTRAVENOUS

## 2014-11-22 MED ORDER — PROPOFOL 10 MG/ML IV BOLUS
INTRAVENOUS | Status: DC | PRN
  Administered 2014-11-22: 150 mg via INTRAVENOUS

## 2014-11-22 MED ORDER — MAGNESIUM HYDROXIDE 400 MG/5ML PO SUSP: 15.0000 mL | Freq: Four times a day (QID) | ORAL | Status: DC | PRN

## 2014-11-22 MED ORDER — NEOSTIGMINE METHYLSULFATE 10 MG/10ML IV SOLN
INTRAVENOUS | Status: AC
  Filled 2014-11-22: qty 1

## 2014-11-22 MED ORDER — HEMOSTATIC AGENTS (NO CHARGE) OPTIME
TOPICAL | Status: DC | PRN
  Administered 2014-11-22: 1 via TOPICAL

## 2014-11-22 MED ORDER — TETRACAINE HCL 0.5 % OP SOLN
2.0000 [drp] | Freq: Once | OPHTHALMIC | Status: DC
  Filled 2014-11-22: qty 2

## 2014-11-22 MED ORDER — GENTAMICIN SULFATE 40 MG/ML IJ SOLN
INTRAMUSCULAR | Status: AC
  Filled 2014-11-22: qty 2

## 2014-11-22 MED ORDER — FENTANYL CITRATE (PF) 100 MCG/2ML IJ SOLN
INTRAMUSCULAR | Status: DC | PRN
  Administered 2014-11-22: 100 ug via INTRAVENOUS

## 2014-11-22 SURGICAL SUPPLY — 69 items
BALL CTTN LRG ABS STRL LF (GAUZE/BANDAGES/DRESSINGS) ×3
BLADE EYE CATARACT 19 1.4 BEAV (BLADE) IMPLANT
BLADE MVR KNIFE 19G (BLADE) IMPLANT
BLADE MVR KNIFE 20G (BLADE) ×2 IMPLANT
CANNULA VLV SOFT TIP 25G (OPHTHALMIC) ×1 IMPLANT
CANNULA VLV SOFT TIP 25GA (OPHTHALMIC) ×4 IMPLANT
CORDS BIPOLAR (ELECTRODE) ×2 IMPLANT
COTTONBALL LRG STERILE PKG (GAUZE/BANDAGES/DRESSINGS) ×6 IMPLANT
COVER MAYO STAND STRL (DRAPES) IMPLANT
DRAPE INCISE 51X51 W/FILM STRL (DRAPES) IMPLANT
DRAPE OPHTHALMIC 77X100 STRL (CUSTOM PROCEDURE TRAY) ×2 IMPLANT
ERASER HMR WETFIELD 23G BP (MISCELLANEOUS) ×2 IMPLANT
FILTER BLUE MILLIPORE (MISCELLANEOUS) ×1 IMPLANT
FILTER STRAW FLUID ASPIR (MISCELLANEOUS) ×2 IMPLANT
FORCEPS GRIESHABER ILM 25G A (INSTRUMENTS) IMPLANT
GAS AUTO FILL CONSTEL (OPHTHALMIC) ×4
GAS AUTO FILL CONSTELLATION (OPHTHALMIC) ×1 IMPLANT
GLOVE SS BIOGEL STRL SZ 6.5 (GLOVE) ×1 IMPLANT
GLOVE SS BIOGEL STRL SZ 7 (GLOVE) ×1 IMPLANT
GLOVE SUPERSENSE BIOGEL SZ 6.5 (GLOVE) ×1
GLOVE SUPERSENSE BIOGEL SZ 7 (GLOVE) ×1
GLOVE SURG 8.5 LATEX PF (GLOVE) ×2 IMPLANT
GOWN STRL REUS W/ TWL LRG LVL3 (GOWN DISPOSABLE) ×3 IMPLANT
GOWN STRL REUS W/TWL LRG LVL3 (GOWN DISPOSABLE) ×6
HANDLE PNEUMATIC FOR CONSTEL (OPHTHALMIC) IMPLANT
KIT BASIN OR (CUSTOM PROCEDURE TRAY) ×2 IMPLANT
KNIFE GRIESHABER SHARP 2.5MM (MISCELLANEOUS) IMPLANT
MICROPICK 25G (MISCELLANEOUS)
NDL 18GX1X1/2 (RX/OR ONLY) (NEEDLE) ×1 IMPLANT
NDL 25GX 5/8IN NON SAFETY (NEEDLE) ×1 IMPLANT
NDL FILTER BLUNT 18X1 1/2 (NEEDLE) IMPLANT
NDL HYPO 30X.5 LL (NEEDLE) IMPLANT
NEEDLE 18GX1X1/2 (RX/OR ONLY) (NEEDLE) ×2 IMPLANT
NEEDLE 25GX 5/8IN NON SAFETY (NEEDLE) ×2 IMPLANT
NEEDLE 27GAX1X1/2 (NEEDLE) IMPLANT
NEEDLE FILTER BLUNT 18X 1/2SAF (NEEDLE)
NEEDLE FILTER BLUNT 18X1 1/2 (NEEDLE) IMPLANT
NEEDLE HYPO 30X.5 LL (NEEDLE) IMPLANT
NS IRRIG 1000ML POUR BTL (IV SOLUTION) ×2 IMPLANT
PACK FRAGMATOME (OPHTHALMIC) IMPLANT
PACK VITRECTOMY CUSTOM (CUSTOM PROCEDURE TRAY) ×2 IMPLANT
PAD ARMBOARD 7.5X6 YLW CONV (MISCELLANEOUS) ×4 IMPLANT
PAK PIK VITRECTOMY CVS 25GA (OPHTHALMIC) ×2 IMPLANT
PIC ILLUMINATED 25G (OPHTHALMIC) ×2
PICK MICROPICK 25G (MISCELLANEOUS) IMPLANT
PIK ILLUMINATED 25G (OPHTHALMIC) ×1 IMPLANT
PROBE LASER ILLUM FLEX CVD 25G (OPHTHALMIC) IMPLANT
REPL STRA BRUSH NDL (NEEDLE) ×1 IMPLANT
REPL STRA BRUSH NEEDLE (NEEDLE) ×2 IMPLANT
RESERVOIR BACK FLUSH (MISCELLANEOUS) ×2 IMPLANT
ROLLS DENTAL (MISCELLANEOUS) ×4 IMPLANT
SCRAPER DIAMOND 25GA (OPHTHALMIC RELATED) IMPLANT
SCRAPER DIAMOND DUST MEMBRANE (MISCELLANEOUS) ×2 IMPLANT
SPONGE SURGIFOAM ABS GEL 12-7 (HEMOSTASIS) ×2 IMPLANT
STOPCOCK 4 WAY LG BORE MALE ST (IV SETS) ×1 IMPLANT
SUT CHROMIC 7 0 TG140 8 (SUTURE) IMPLANT
SUT ETHILON 9 0 TG140 8 (SUTURE) ×2 IMPLANT
SUT POLY NON ABSORB 10-0 8 STR (SUTURE) IMPLANT
SUT SILK 4 0 RB 1 (SUTURE) IMPLANT
SUT VICRYL 7 0 TG140 8 (SUTURE) ×1 IMPLANT
SYR 20CC LL (SYRINGE) ×2 IMPLANT
SYR 50ML LL SCALE MARK (SYRINGE) ×1 IMPLANT
SYR 5ML LL (SYRINGE) IMPLANT
SYR BULB 3OZ (MISCELLANEOUS) ×2 IMPLANT
SYR TB 1ML LUER SLIP (SYRINGE) ×2 IMPLANT
SYRINGE 10CC LL (SYRINGE) ×1 IMPLANT
TUBING HIGH PRESS EXTEN 6IN (TUBING) IMPLANT
WATER STERILE IRR 1000ML POUR (IV SOLUTION) ×2 IMPLANT
WIPE INSTRUMENT VISIWIPE 73X73 (MISCELLANEOUS) IMPLANT

## 2014-11-22 NOTE — Anesthesia Procedure Notes (Signed)
Procedure Name: Intubation Date/Time: 11/22/2014 11:44 AM Performed by: Jenne Campus Pre-anesthesia Checklist: Patient identified, Emergency Drugs available, Suction available, Patient being monitored and Timeout performed Patient Re-evaluated:Patient Re-evaluated prior to inductionOxygen Delivery Method: Circle system utilized Preoxygenation: Pre-oxygenation with 100% oxygen Intubation Type: IV induction Ventilation: Mask ventilation without difficulty and Oral airway inserted - appropriate to patient size Laryngoscope Size: Miller and 2 Grade View: Grade I Tube type: Oral Tube size: 7.0 mm Number of attempts: 1 Airway Equipment and Method: Stylet and LTA kit utilized Placement Confirmation: ETT inserted through vocal cords under direct vision,  positive ETCO2,  CO2 detector and breath sounds checked- equal and bilateral Secured at: 22 cm Tube secured with: Tape Dental Injury: Teeth and Oropharynx as per pre-operative assessment

## 2014-11-22 NOTE — Anesthesia Postprocedure Evaluation (Signed)
Anesthesia Post Note  Patient: Brittany Weaver  Procedure(s) Performed: Procedure(s) (LRB): 25 GAUGE PARS PLANA VITRECTOMY WITH 20 GAUGE MVR PORT FOR MACULAR HOLE LEFT EYE  (Left) SERUM PATCH (Left) MEMBRANE PEEL (Left) LASER PHOTO ABLATION (Left) GAS/FLUID EXCHANGE (Left)  Anesthesia type: general  Patient location: PACU  Post pain: Pain level controlled  Post assessment: Patient's Cardiovascular Status Stable  Last Vitals:  Filed Vitals:   11/22/14 1350  BP:   Pulse:   Temp: 36.5 C  Resp:     Post vital signs: Reviewed and stable  Level of consciousness: sedated  Complications: No apparent anesthesia complications

## 2014-11-22 NOTE — Transfer of Care (Signed)
Immediate Anesthesia Transfer of Care Note  Patient: Brittany Weaver  Procedure(s) Performed: Procedure(s) with comments: 25 GAUGE PARS PLANA VITRECTOMY WITH 20 GAUGE MVR PORT FOR MACULAR HOLE LEFT EYE  (Left) SERUM PATCH (Left) MEMBRANE PEEL (Left) LASER PHOTO ABLATION (Left) - Headscope laser GAS/FLUID EXCHANGE (Left) - C3F8  Patient Location: PACU  Anesthesia Type:General  Level of Consciousness: awake, oriented and patient cooperative  Airway & Oxygen Therapy: Patient Spontanous Breathing and Patient connected to nasal cannula oxygen  Post-op Assessment: Report given to RN and Post -op Vital signs reviewed and stable  Post vital signs: Reviewed  Last Vitals:  Filed Vitals:   11/22/14 0917  BP: 152/54  Pulse: 59  Temp: 36.3 C  Resp: 20    Complications: No apparent anesthesia complications

## 2014-11-22 NOTE — H&P (Signed)
I examined the patient today and there is no change in the medical status 

## 2014-11-22 NOTE — Brief Op Note (Signed)
Brief Operative note   Preoperative diagnosis:  macular hole LEFT EYE  Postoperative diagnosis  * No Diagnosis Codes entered *  Procedures: Repair of macular hole with pars plana vitrectomy, laser, membrane/ILM peel, gas injection left eye  Surgeon:  Hayden Pedro, MD...  Assistant:  Deatra Ina SA    Anesthesia: General  Specimen: none  Estimated blood loss:  1cc  Complications: none  Patient sent to PACU in good condition  Composed by Hayden Pedro MD  Dictation number: [phone]

## 2014-11-22 NOTE — Anesthesia Preprocedure Evaluation (Addendum)
Anesthesia Evaluation  Patient identified by MRN, date of birth, ID band Patient awake    Reviewed: Allergy & Precautions, NPO status , Patient's Chart, lab work & pertinent test results  History of Anesthesia Complications (+) PONV and history of anesthetic complications  Airway Mallampati: II  TM Distance: >3 FB Neck ROM: Full    Dental  (+) Teeth Intact, Dental Advisory Given   Pulmonary neg pulmonary ROS,    Pulmonary exam normal       Cardiovascular hypertension, Pt. on medications and Pt. on home beta blockers Normal cardiovascular exam+ Valvular Problems/Murmurs MVP     Neuro/Psych negative neurological ROS  negative psych ROS   GI/Hepatic negative GI ROS, Neg liver ROS,   Endo/Other  negative endocrine ROS  Renal/GU negative Renal ROS     Musculoskeletal   Abdominal   Peds  Hematology negative hematology ROS (+)   Anesthesia Other Findings   Reproductive/Obstetrics                          Anesthesia Physical Anesthesia Plan  ASA: III  Anesthesia Plan: General   Post-op Pain Management:    Induction: Intravenous  Airway Management Planned: Oral ETT  Additional Equipment:   Intra-op Plan:   Post-operative Plan: Extubation in OR  Informed Consent: I have reviewed the patients History and Physical, chart, labs and discussed the procedure including the risks, benefits and alternatives for the proposed anesthesia with the patient or authorized representative who has indicated his/her understanding and acceptance.   Dental advisory given  Plan Discussed with: CRNA, Anesthesiologist and Surgeon  Anesthesia Plan Comments:        Anesthesia Quick Evaluation

## 2014-11-23 ENCOUNTER — Encounter (HOSPITAL_COMMUNITY): Payer: Self-pay | Admitting: Ophthalmology

## 2014-11-23 DIAGNOSIS — H35342 Macular cyst, hole, or pseudohole, left eye: Secondary | ICD-10-CM | POA: Diagnosis not present

## 2014-11-23 MED ORDER — KETOROLAC TROMETHAMINE 0.5 % OP SOLN: 1.0000 [drp] | Freq: Four times a day (QID) | OPHTHALMIC | Status: DC

## 2014-11-23 MED ORDER — GATIFLOXACIN 0.5 % OP SOLN: 1.0000 [drp] | Freq: Four times a day (QID) | OPHTHALMIC | Status: DC

## 2014-11-23 MED ORDER — BRIMONIDINE TARTRATE 0.2 % OP SOLN: 1.0000 [drp] | Freq: Two times a day (BID) | OPHTHALMIC | Status: DC

## 2014-11-23 MED ORDER — LOTEPREDNOL ETABONATE 0.5 % OP SUSP: 1.0000 [drp] | Freq: Four times a day (QID) | OPHTHALMIC | Status: DC

## 2014-11-23 MED ORDER — BACITRACIN-POLYMYXIN B 500-10000 UNIT/GM OP OINT: 1.0000 "application " | TOPICAL_OINTMENT | Freq: Three times a day (TID) | OPHTHALMIC | Status: DC

## 2014-11-23 NOTE — Op Note (Signed)
Brittany Weaver, Brittany Weaver                 ACCOUNT NO.:  [id number]  MEDICAL RECORD NO.:  [id number]  LOCATION:  [id number]                        FACILITY:  [facility]  PHYSICIAN:  Chrystie Nose. Zigmund Daniel, M.D. DATE OF BIRTH:  October 15, 1940  DATE OF PROCEDURE:  11/22/2014 DATE OF DISCHARGE:                              OPERATIVE REPORT   ADMISSION DIAGNOSIS:  Macular hole in the left eye.  PROCEDURES:  Repair of macular hole, left eye with pars plana vitrectomy, membrane peel, ILM peel, serum patch, laser photocoagulation, gas fluid exchange, all in the left eye.  SURGEON:  Chrystie Nose. Zigmund Daniel, M.D.  ASSISTANT:  Deatra Ina, SA.  ANESTHESIA:  General.  DETAILS:  After usual prep and drape, the indirect ophthalmoscope laser was moved into place, 625 burns were placed around the retinal periphery, in weak areas of retina the power was 400 mW 1000 microns each and 0.1 seconds each.  Attention was then carried to the pars plana area where 25-gauge trocars were placed at 4 o'clock and 10 o'clock. Infusion at 4 o'clock.  A 3-layered 20-gauge scleral entry wound was created at 2 o'clock.  Contact lens ring was anchored into place at 6 and 12 o'clock.  Provisc was placed on the corneal surface and the flat contact lens was placed.  Pars plana vitrectomy was performed removing anterior vitreous and then core vitreous down to the macular surface. The silicone tip suction line was drawn down to the macular surface and thus the fish-strike sign occurred.  Posterior hyaloid was lifted with the silicone tip suction line and elevated with the lighted pick. Additional vitrectomy was carried out at this point removing these vitreous remnants.  The 30-degree prismatic lens was moved into place and the vitrectomy was carried into the mid periphery and the far periphery down to the vitreous base.  Once this was accomplished, the magnifying contact lens was placed onto methylcellulose and onto the cornea.  The macular hole  was inspected and there was mild elevation. The diamond-dusted membrane scraper, 20-gauge was used to engage the internal limiting membrane and peel it from its attachments to the edge of the wound and the macular region.  The edges of the wound were carefully teased so that they would lie flat.  Once the hole was freed, additional vitreous cutting was performed.  Once all debris was removed with the vitreous cutter, a total gas fluid exchange was carried out. Sufficient time was allowed for additional fluid to track down the walls of the eye collected in the posterior segment.  The serum patch was prepared and the C3F8 14% injection was prepared.  [place] ophthalmics brush was used to remove posterior fluid.  Biconcave contact lens was placed on the corneal surface.  Serum patch was delivered.  Excess fluid and serum was removed with a [place] ophthalmics brush until the entire macular region was dry.  The C3F8 14% was exchanged for intravitreal gas.  The instruments were removed from the eye.  9-0 nylon was used to close the sclerotomy site at 2 o'clock.  The other wounds were tested and found to be secure.  Polymyxin and gentamicin were irrigated into tenon space.  Marcaine was injected  around the globe for postop pain.  Wet-field cautery was used to close the conjunctiva.  Closing pressure was 10 with a Pharmacologist. Polysporin ophthalmic ointment and patch and shield were placed.  The patient was awakened, taken to recovery in a satisfactory condition.     Chrystie Nose. Zigmund Daniel, M.D.     JDM/MEDQ  D:  11/22/2014  T:  11/23/2014  Job:  [id number]

## 2014-11-23 NOTE — Progress Notes (Signed)
11/23/2014, 6:32 AM  Mental Status:  Awake, Alert, Oriented  Anterior segment: Cornea  Clear    Anterior Chamber Clear    Lens:   Clear  Intra Ocular Pressure 22 mmHg with Tonopen  Vitreous: Clear 95%gas bubble   Retina:  Attached Good laser reaction   Impression: Excellent result Retina attached   Final Diagnosis: Principal Problem:   Macular hole, left eye   Plan: start post operative eye drops.  Discharge to home.  Give post operative instructions  Hayden Pedro 11/23/2014, 6:32 AM

## 2014-11-23 NOTE — Discharge Summary (Signed)
Discharge summary not needed on [facility] patients per medical records. 

## 2014-12-05 ENCOUNTER — Encounter (INDEPENDENT_AMBULATORY_CARE_PROVIDER_SITE_OTHER): Payer: Medicare Other | Admitting: Ophthalmology

## 2014-12-05 DIAGNOSIS — H35342 Macular cyst, hole, or pseudohole, left eye: Secondary | ICD-10-CM

## 2014-12-26 ENCOUNTER — Encounter (INDEPENDENT_AMBULATORY_CARE_PROVIDER_SITE_OTHER): Payer: Medicare Other | Admitting: Ophthalmology

## 2015-01-11 ENCOUNTER — Encounter (INDEPENDENT_AMBULATORY_CARE_PROVIDER_SITE_OTHER): Payer: Medicare Other | Admitting: Ophthalmology

## 2015-01-11 DIAGNOSIS — H35342 Macular cyst, hole, or pseudohole, left eye: Secondary | ICD-10-CM

## 2015-03-27 ENCOUNTER — Encounter (INDEPENDENT_AMBULATORY_CARE_PROVIDER_SITE_OTHER): Payer: Medicare Other | Admitting: Ophthalmology

## 2015-03-27 DIAGNOSIS — D3132 Benign neoplasm of left choroid: Secondary | ICD-10-CM

## 2015-03-27 DIAGNOSIS — I10 Essential (primary) hypertension: Secondary | ICD-10-CM | POA: Diagnosis not present

## 2015-03-27 DIAGNOSIS — H35342 Macular cyst, hole, or pseudohole, left eye: Secondary | ICD-10-CM

## 2015-03-27 DIAGNOSIS — H35033 Hypertensive retinopathy, bilateral: Secondary | ICD-10-CM | POA: Diagnosis not present

## 2015-03-27 DIAGNOSIS — H43811 Vitreous degeneration, right eye: Secondary | ICD-10-CM | POA: Diagnosis not present

## 2015-04-12 ENCOUNTER — Other Ambulatory Visit: Payer: Self-pay | Admitting: Cardiovascular Disease

## 2015-06-13 ENCOUNTER — Ambulatory Visit (HOSPITAL_COMMUNITY)
Admission: RE | Admit: 2015-06-13 | Discharge: 2015-06-13 | Disposition: A | Discharge status: Home / Self Care | Payer: Medicare Other | Source: Ambulatory Visit | Attending: Physician Assistant | Admitting: Physician Assistant

## 2015-06-13 ENCOUNTER — Other Ambulatory Visit (HOSPITAL_COMMUNITY): Payer: Self-pay | Admitting: Physician Assistant

## 2015-06-13 DIAGNOSIS — M25562 Pain in left knee: Secondary | ICD-10-CM

## 2015-06-13 DIAGNOSIS — E663 Overweight: Secondary | ICD-10-CM | POA: Diagnosis present

## 2015-06-13 DIAGNOSIS — Z6826 Body mass index (BMI) 26.0-26.9, adult: Secondary | ICD-10-CM | POA: Insufficient documentation

## 2015-06-13 DIAGNOSIS — Z1389 Encounter for screening for other disorder: Secondary | ICD-10-CM | POA: Diagnosis not present

## 2015-07-13 ENCOUNTER — Other Ambulatory Visit: Payer: Self-pay | Admitting: Cardiovascular Disease

## 2015-07-13 NOTE — Telephone Encounter (Signed)
REFILL 

## 2015-07-25 ENCOUNTER — Other Ambulatory Visit: Payer: Self-pay | Admitting: *Deleted

## 2015-07-25 MED ORDER — METOPROLOL TARTRATE 25 MG PO TABS: 25.0000 mg | ORAL_TABLET | Freq: Two times a day (BID) | ORAL | Status: DC

## 2015-07-25 MED ORDER — LOVASTATIN 20 MG PO TABS: 20.0000 mg | ORAL_TABLET | Freq: Every day | ORAL | Status: DC

## 2015-07-25 NOTE — Telephone Encounter (Signed)
REFILL 

## 2015-08-16 ENCOUNTER — Ambulatory Visit (INDEPENDENT_AMBULATORY_CARE_PROVIDER_SITE_OTHER): Payer: Medicare Other | Admitting: Cardiovascular Disease

## 2015-08-16 ENCOUNTER — Encounter: Payer: Self-pay | Admitting: Cardiovascular Disease

## 2015-08-16 VITALS — BP 144/75 | HR 48 | Ht 66.0 in | Wt 158.4 lb

## 2015-08-16 DIAGNOSIS — E785 Hyperlipidemia, unspecified: Secondary | ICD-10-CM

## 2015-08-16 DIAGNOSIS — I10 Essential (primary) hypertension: Secondary | ICD-10-CM | POA: Diagnosis not present

## 2015-08-16 DIAGNOSIS — I480 Paroxysmal atrial fibrillation: Secondary | ICD-10-CM

## 2015-08-16 NOTE — Assessment & Plan Note (Signed)
History of hypertension with blood pressure measures 144/75. She is on hydrochlorothiazide and metoprolol. Continue current meds at current dose

## 2015-08-16 NOTE — Progress Notes (Signed)
08/16/2015 Brittany Weaver   1940/10/26  [id number]  Primary Physician Glo Herring., MD Primary Cardiologist: Lorretta Harp MD [name]   HPI:  The patient is a 75 year old, thin-appearing, married Caucasian female, mother of 3 who I last saw in the office 06/14/14.Marland Kitchen She has a strong family history of heart disease, as well as hypertension, hyperlipidemia and paroxysmal A-fib. She is a retired Loss adjuster, chart[employer]. Her last Myoview performed 3 years ago was nonischemic. She denies chest pain or shortness of breath. She does have occasional episodes of breakthrough A-fib with some dizziness but no presyncope. We will discuss oral anticoagulation which she preferred not to be on which I can't disagree with given her relative infrequency of breakthrough episodes.   Current Outpatient Prescriptions  Medication Sig Dispense Refill  . aspirin 81 MG tablet Take 81 mg by mouth daily.     . Calcium Carbonate-Vitamin D (CALCIUM + D PO) Take 1 tablet by mouth daily.     . Estradiol (VAGIFEM) 10 MCG TABS vaginal tablet Place 1 tablet (10 mcg total) vaginally 2 (two) times a week. 8 tablet 11  . hydrochlorothiazide (HYDRODIURIL) 25 MG tablet Take 25 mg by mouth daily.    Marland Kitchen ketorolac (ACULAR) 0.5 % ophthalmic solution Place 1 drop into the left eye 4 (four) times daily. 5 mL 0  . loteprednol (LOTEMAX) 0.5 % ophthalmic suspension Place 1 drop into the left eye 4 (four) times daily. 5 mL 0  . lovastatin (MEVACOR) 20 MG tablet Take 1 tablet (20 mg total) by mouth daily at 6 PM. KEEP OV. 30 tablet 0  . Magnesium 400 MG CAPS Take 400 mg by mouth daily.     . metoprolol tartrate (LOPRESSOR) 25 MG tablet Take 1 tablet (25 mg total) by mouth 2 (two) times daily. KEEP OV. 60 tablet 0  . Omega-3 Fatty Acids (FISH OIL) 1200 MG CAPS Take 1,200 mg by mouth daily.     . vitamin B-12 (CYANOCOBALAMIN) 1000 MCG tablet Take 1,000 mcg by mouth daily.     No current facility-administered  medications for this visit.    Allergies  Allergen Reactions  . Penicillins Hives    HIVES  . Prednisone Other (See Comments)    DIZZINESS and swelling    Social History   Social History  . Marital Status: Married    Spouse Name: N/A  . Number of Children: N/A  . Years of Education: N/A   Occupational History  . Not on file.   Social History Main Topics  . Smoking status: Never Smoker   . Smokeless tobacco: Never Used  . Alcohol Use: 0.0 oz/week    0 Standard drinks or equivalent per week     Comment: RARELY  . Drug Use: No  . Sexual Activity: Yes    Birth Control/ Protection: Post-menopausal     Comment: 1st intercourse 2 yo-1 partner   Other Topics Concern  . Not on file   Social History Narrative     Review of Systems: General: negative for chills, fever, night sweats or weight changes.  Cardiovascular: negative for chest pain, dyspnea on exertion, edema, orthopnea, palpitations, paroxysmal nocturnal dyspnea or shortness of breath Dermatological: negative for rash Respiratory: negative for cough or wheezing Urologic: negative for hematuria Abdominal: negative for nausea, vomiting, diarrhea, bright red blood per rectum, melena, or hematemesis Neurologic: negative for visual changes, syncope, or dizziness All other systems reviewed and are otherwise negative except as noted above.  Blood pressure 144/75, pulse 48, height 5\' 6"  (1.676 m), weight 158 lb 6.4 oz (71.85 kg).  General appearance: alert and no distress Neck: no adenopathy, no carotid bruit, no JVD, supple, symmetrical, trachea midline and thyroid not enlarged, symmetric, no tenderness/mass/nodules Lungs: clear to auscultation bilaterally Heart: regular rate and rhythm, S1, S2 normal, no murmur, click, rub or gallop Extremities: extremities normal, atraumatic, no cyanosis or edema  EKG sinus bradycardia 48 without ST or QT changes. I personally reviewed this EKG  ASSESSMENT AND PLAN:    Essential hypertension History of hypertension with blood pressure measures 144/75. She is on hydrochlorothiazide and metoprolol. Continue current meds at current dose  Hyperlipidemia History of hyperlipidemia on statin therapy followed by her PCP  Paroxysmal atrial fibrillation History of PAF with very rare flutters occurring once or twice lasting seconds at a time. We have discussed oral anticoagulation which she prefers not to pursue.      Lorretta Harp MD FACP,FACC,FAHA, [facility] 08/16/2015 11:03 AM

## 2015-08-16 NOTE — Assessment & Plan Note (Signed)
History of hyperlipidemia on statin therapy followed by her PCP. 

## 2015-08-16 NOTE — Patient Instructions (Signed)

## 2015-08-16 NOTE — Assessment & Plan Note (Signed)
History of PAF with very rare flutters occurring once or twice lasting seconds at a time. We have discussed oral anticoagulation which she prefers not to pursue.

## 2015-09-21 ENCOUNTER — Other Ambulatory Visit: Payer: Self-pay | Admitting: Cardiovascular Disease

## 2015-09-21 NOTE — Telephone Encounter (Signed)
Rx(s) sent to pharmacy electronically.  

## 2015-09-22 ENCOUNTER — Telehealth: Payer: Self-pay | Admitting: Cardiovascular Disease

## 2015-09-22 MED ORDER — METOPROLOL TARTRATE 50 MG PO TABS: 25.0000 mg | ORAL_TABLET | Freq: Two times a day (BID) | ORAL | Status: DC

## 2015-09-22 NOTE — Telephone Encounter (Signed)
Spoke with pharmacy, okay given for new script for lopressor 50 mg 1/2 tablet twice daily.

## 2015-09-22 NOTE — Telephone Encounter (Signed)
Brittany Weaver([facility]) is calling to get a medication changed. The Metoprolol 25mg  is on back order and they want to see if they can Get changed to 50 mg.

## 2015-09-28 ENCOUNTER — Ambulatory Visit (INDEPENDENT_AMBULATORY_CARE_PROVIDER_SITE_OTHER): Payer: Medicare Other | Admitting: Gynecology

## 2015-09-28 ENCOUNTER — Encounter: Payer: Self-pay | Admitting: Gynecology

## 2015-09-28 VITALS — BP 120/76 | Ht 66.5 in | Wt 157.0 lb

## 2015-09-28 DIAGNOSIS — N8111 Cystocele, midline: Secondary | ICD-10-CM

## 2015-09-28 DIAGNOSIS — M8580 Other specified disorders of bone density and structure, unspecified site: Secondary | ICD-10-CM | POA: Diagnosis not present

## 2015-09-28 DIAGNOSIS — N952 Postmenopausal atrophic vaginitis: Secondary | ICD-10-CM

## 2015-09-28 DIAGNOSIS — N9089 Other specified noninflammatory disorders of vulva and perineum: Secondary | ICD-10-CM | POA: Diagnosis not present

## 2015-09-28 DIAGNOSIS — N814 Uterovaginal prolapse, unspecified: Secondary | ICD-10-CM

## 2015-09-28 DIAGNOSIS — Z01419 Encounter for gynecological examination (general) (routine) without abnormal findings: Secondary | ICD-10-CM

## 2015-09-28 DIAGNOSIS — N816 Rectocele: Secondary | ICD-10-CM

## 2015-09-28 MED ORDER — ESTRADIOL 10 MCG VA TABS: 1.0000 | ORAL_TABLET | VAGINAL | Status: DC

## 2015-09-28 NOTE — Patient Instructions (Signed)
Follow up for the vulvar biopsy as scheduled.  Follow up for bone density when you have your mammogram.

## 2015-09-28 NOTE — Progress Notes (Signed)
    Brittany Weaver Sep 11, 1940 [id number]        74 y.o.  [id number]  for Breast and pelvic exam. Several issues noted below.  Past medical history,surgical history, problem list, medications, allergies, family history and social history were all reviewed and documented as reviewed in the EPIC chart.  ROS:  Performed with pertinent positives and negatives included in the history, assessment and plan.   Additional significant findings :  none   Exam: [name] assistant Filed Vitals:   09/28/15 1357  BP: 120/76  Height: 5' 6.5" (1.689 m)  Weight: 157 lb (71.215 kg)   General appearance:  Normal affect, orientation and appearance. Skin: Grossly normal HEENT: Without gross lesions.  No cervical or supraclavicular adenopathy. Thyroid normal.  Lungs:  Clear without wheezing, rales or rhonchi Cardiac: RR, without RMG Abdominal:  Soft, nontender, without masses, guarding, rebound, organomegaly or hernia Breasts:  Examined lying and sitting without masses, retractions, discharge or axillary adenopathy.  Fullness in left axilla stable from prior exam Pelvic:  Ext/BUS/vagina with atrophic changes. 2 irregular dark pigmented lesions left labia majora. Mild cystocele/rectocele/uterine prolapse. cystocele,  Cervix with atrophic changes  Uterus anteverted, normal size, shape and contour, midline and mobile nontender   Adnexa without masses or tenderness    Anus and perineum normal   Rectovaginal normal sphincter tone without palpated masses or tenderness.    Assessment/Plan:  75 y.o. [id number] female breast and pelvic exam.  1. Post menopausal/atrophic genital changes.  Using Vagifem twice weekly with good results and she wants to continue. We've discussed the whole issue of absorption with risks of stroke heart attack DVT breast cancer endometrial stimulation. She is comfortable with this. Refill 1 year report provided. No vaginal bleeding and she knows to call if she has any vaginal  bleeding. 2. Vulvar lesions. New pigmented lesions left labia majora. Recommended patient have excised. Differential discussed with her. Patient agrees with this and will schedule an appointment to follow up for this. 3. Pelvic relaxation. Patient with mild cystocele/rectocele/uterine prolapse. Without symptoms as far as pressure urinary or bowel. Will follow at present and she'll call if she has any issues. 4. Left axillary fullness. Mammogram/ultrasound are negative. Saw Dr. Ninfa Linden and they discussed options to include excision of the tissue and she does not want do this and prefers just to monitor. It does not really bother her as remain stable on exam. Coming due for mammogram now and she can schedule this. 5. Osteopenia. DEXA 2013 T score -1.4. Recommended she schedule her DEXA now with her mammogram and she agrees to do so. 6. Pap smear 2016. No Pap smear done today. Options to stop screening based on age discussed per current screening guidelines. 7. Colonoscopy 2015. Repeat at their recommended interval. 8. Health maintenance. No routine lab work done as this is done at her primary physician's office. Follow up 1 year, sooner as needed.  Greater than 10 minutes of my time in excess of her breast and pelvic exam was spent in direct face to face counseling and coordination of care in regards to her problems of vulvar lesions.  Anastasio Auerbach MD, 2:28 PM 09/28/2015

## 2015-10-04 ENCOUNTER — Other Ambulatory Visit: Payer: Self-pay | Admitting: Gynecology

## 2015-10-27 ENCOUNTER — Ambulatory Visit (INDEPENDENT_AMBULATORY_CARE_PROVIDER_SITE_OTHER): Payer: Medicare Other | Admitting: Gynecology

## 2015-10-27 ENCOUNTER — Encounter: Payer: Self-pay | Admitting: Gynecology

## 2015-10-27 VITALS — BP 122/80

## 2015-10-27 DIAGNOSIS — N9089 Other specified noninflammatory disorders of vulva and perineum: Secondary | ICD-10-CM

## 2015-10-27 NOTE — Progress Notes (Signed)
    Brittany Weaver [birth year] [id number]        74 y.o.  [id number] presents for vulvar biopsy. At recent annual exam was noted to have 2 separate pigmented geographic shaped lesions on the left mid labia majora. Asymptomatic without itching or irritation.  Past medical history,surgical history, problem list, medications, allergies, family history and social history were all reviewed and documented in the EPIC chart.  Directed ROS with pertinent positives and negatives documented in the history of present illness/assessment and plan.  Exam: [name] assistant Filed Vitals:   10/27/15 1449  BP: 122/80   General appearance:  Normal Pelvic external BUS vagina with 2 pigmented lesions left mid labia majora as diagrammed. Physical Exam  Genitourinary:      Procedure: The skin overlying and surrounding the 2 pigmented lesions was cleansed by Betadine solution. 1% Xylocaine was infiltrated into the skin surrounding the lesions. Both lesions were sharply excised completely the level the surrounding skin. There were sent together to pathology. Silver nitrate hemostasis applied. Postoperative care discussed. Patient tolerated well.  Assessment/Plan:  75 y.o. [id number] with 2 pigmented lesions left labia majora. Excised in their entirety. Patient will follow up for pathology results next week. She knows if she does not hear from [place] to call to make sure we follow up on the results.    Anastasio Auerbach MD, 3:06 PM 10/27/2015

## 2015-10-27 NOTE — Patient Instructions (Signed)
Office will call you with biopsy results 

## 2015-11-01 LAB — PATHOLOGY

## 2016-03-27 ENCOUNTER — Ambulatory Visit (INDEPENDENT_AMBULATORY_CARE_PROVIDER_SITE_OTHER): Payer: Medicare Other | Admitting: Ophthalmology

## 2016-04-10 ENCOUNTER — Ambulatory Visit (INDEPENDENT_AMBULATORY_CARE_PROVIDER_SITE_OTHER): Payer: Medicare Other | Admitting: Ophthalmology

## 2016-04-10 DIAGNOSIS — H43811 Vitreous degeneration, right eye: Secondary | ICD-10-CM | POA: Diagnosis not present

## 2016-04-10 DIAGNOSIS — D3132 Benign neoplasm of left choroid: Secondary | ICD-10-CM

## 2016-04-10 DIAGNOSIS — I10 Essential (primary) hypertension: Secondary | ICD-10-CM | POA: Diagnosis not present

## 2016-04-10 DIAGNOSIS — H35342 Macular cyst, hole, or pseudohole, left eye: Secondary | ICD-10-CM | POA: Diagnosis not present

## 2016-04-10 DIAGNOSIS — H35033 Hypertensive retinopathy, bilateral: Secondary | ICD-10-CM

## 2016-04-29 ENCOUNTER — Other Ambulatory Visit: Payer: Self-pay | Admitting: Cardiovascular Disease

## 2016-04-30 NOTE — Telephone Encounter (Signed)
Rx has been sent to the pharmacy electronically. ° °

## 2016-05-24 ENCOUNTER — Observation Stay (HOSPITAL_COMMUNITY): Payer: Medicare Other

## 2016-05-24 ENCOUNTER — Encounter (HOSPITAL_COMMUNITY): Payer: Self-pay | Admitting: *Deleted

## 2016-05-24 ENCOUNTER — Emergency Department (HOSPITAL_COMMUNITY): Payer: Medicare Other

## 2016-05-24 ENCOUNTER — Observation Stay (HOSPITAL_COMMUNITY)
Admission: EM | Admit: 2016-05-24 | Discharge: 2016-05-25 | Disposition: A | Discharge status: Home / Self Care | Payer: Medicare Other | Attending: Internal Medicine | Admitting: Internal Medicine

## 2016-05-24 DIAGNOSIS — E785 Hyperlipidemia, unspecified: Secondary | ICD-10-CM | POA: Diagnosis present

## 2016-05-24 DIAGNOSIS — I10 Essential (primary) hypertension: Secondary | ICD-10-CM | POA: Diagnosis present

## 2016-05-24 DIAGNOSIS — E784 Other hyperlipidemia: Secondary | ICD-10-CM

## 2016-05-24 DIAGNOSIS — R001 Bradycardia, unspecified: Secondary | ICD-10-CM | POA: Diagnosis not present

## 2016-05-24 DIAGNOSIS — R071 Chest pain on breathing: Secondary | ICD-10-CM

## 2016-05-24 DIAGNOSIS — Z7982 Long term (current) use of aspirin: Secondary | ICD-10-CM | POA: Diagnosis not present

## 2016-05-24 DIAGNOSIS — R0789 Other chest pain: Principal | ICD-10-CM | POA: Insufficient documentation

## 2016-05-24 DIAGNOSIS — Z79899 Other long term (current) drug therapy: Secondary | ICD-10-CM | POA: Diagnosis not present

## 2016-05-24 DIAGNOSIS — R079 Chest pain, unspecified: Secondary | ICD-10-CM | POA: Diagnosis present

## 2016-05-24 DIAGNOSIS — I480 Paroxysmal atrial fibrillation: Secondary | ICD-10-CM | POA: Diagnosis not present

## 2016-05-24 LAB — BASIC METABOLIC PANEL
ANION GAP: 10 (ref 5–15)
BUN: 19 mg/dL (ref 6–20)
CHLORIDE: 102 mmol/L (ref 101–111)
CO2: 24 mmol/L (ref 22–32)
CREATININE: 0.9 mg/dL (ref 0.44–1.00)
Calcium: 10.1 mg/dL (ref 8.9–10.3)
GFR calc non Af Amer: >60 mL/min (ref >60)
Glucose, Bld: 95 mg/dL (ref 65–99)
Potassium: 3.7 mmol/L (ref 3.5–5.1)
SODIUM: 136 mmol/L (ref 135–145)

## 2016-05-24 LAB — CBC
HEMATOCRIT: 44 % (ref 36.0–46.0)
HEMOGLOBIN: 15.1 g/dL — AB (ref 12.0–15.0)
MCH: 29.2 pg (ref 26.0–34.0)
MCHC: 34.3 g/dL (ref 30.0–36.0)
MCV: 84.9 fL (ref 78.0–100.0)
Platelets: 208 10*3/uL (ref 150–400)
RBC: 5.18 MIL/uL — AB (ref 3.87–5.11)
RDW: 12.9 % (ref 11.5–15.5)
WBC: 7.7 10*3/uL (ref 4.0–10.5)

## 2016-05-24 LAB — TROPONIN I: Troponin I: <0.03 ng/mL (ref <0.03)

## 2016-05-24 LAB — TSH: TSH: 1.947 u[IU]/mL (ref 0.350–4.500)

## 2016-05-24 MED ORDER — ACETAMINOPHEN 325 MG PO TABS: 650.0000 mg | ORAL_TABLET | ORAL | Status: DC | PRN

## 2016-05-24 MED ORDER — ENOXAPARIN SODIUM 40 MG/0.4ML ~~LOC~~ SOLN
40.0000 mg | SUBCUTANEOUS | Status: DC
  Filled 2016-05-24: qty 0.4

## 2016-05-24 MED ORDER — ONDANSETRON HCL 4 MG/2ML IJ SOLN: 4.0000 mg | Freq: Four times a day (QID) | INTRAMUSCULAR | Status: DC | PRN

## 2016-05-24 MED ORDER — ASPIRIN EC 81 MG PO TBEC
81.0000 mg | DELAYED_RELEASE_TABLET | Freq: Every day | ORAL | Status: DC
Start: 2016-05-24 — End: 2016-05-25
  Administered 2016-05-25: 81 mg via ORAL
  Filled 2016-05-24: qty 1

## 2016-05-24 MED ORDER — PRAVASTATIN SODIUM 10 MG PO TABS
10.0000 mg | ORAL_TABLET | Freq: Every day | ORAL | Status: DC
  Administered 2016-05-24: 10 mg via ORAL
  Filled 2016-05-24: qty 1

## 2016-05-24 MED ORDER — HYDROCHLOROTHIAZIDE 25 MG PO TABS
25.0000 mg | ORAL_TABLET | Freq: Every day | ORAL | Status: DC
Start: 2016-05-25 — End: 2016-05-25
  Administered 2016-05-25: 25 mg via ORAL
  Filled 2016-05-24: qty 1

## 2016-05-24 MED ORDER — GI COCKTAIL ~~LOC~~: 30.0000 mL | Freq: Four times a day (QID) | ORAL | Status: DC | PRN

## 2016-05-24 NOTE — ED Provider Notes (Signed)
[place] DEPT Provider Note   CSN: [id number] Arrival date & time: 05/24/16  [id number]  By signing my name below, I, Judithe Modest, attest that this documentation has been prepared under the direction and in the presence of Nat Christen, MD. Electronically Signed: Judithe Modest, ER Scribe. 12/09/2015. 11:00 AM.  History   Chief Complaint Chief Complaint  Patient presents with  . Chest Pain   The history is provided by the patient. No language interpreter was used.   HPI Comments: RITAL CASALETTO is a 76 y.o. female who presents to the Emergency Department complaining of worsening chest tightness with discomfort radiating to her right shoulder and associated high blood pressure and mild HA that started last night. She states she has had mild intermittent CP/tightness for the last month. She checked her BP last night due to chest tightness after taking some medication around 11pm and it was 187/82. Her BP usually runs 130/90. She reported to her PCP this morning due to her sx and was given nitroglycerin in his office which improved her sx. Her PCP is [name]. An EKG was completed in his office. She has a PMHx of mitral valve prolapse and afib. Dr Gwenlyn Found is her cardiologist at [facility]. She denies SOB, nausea. She has some intermittent night sweats. She has a significant famly hx of heart disease (father, brother, sister).    Past Medical History:  Diagnosis Date  . Hypercholesterolemia   . Hypertension   . Macular hole of left eye   . Mitral valve prolapse    no issues  . Osteopenia 2013   T score -1.4 stable from prior DEXA 2007 no FRAX calculated  . Paroxysmal atrial fibrillation (HCC)   . Pelvic relaxation    Mild and asymptomatic.  Marland Kitchen PONV (postoperative nausea and vomiting)    "little bit of nausea" after cataract surgery    Patient Active Problem List   Diagnosis Date Noted  . Chest pain 05/24/2016  . Macular hole, left eye 11/22/2014  . Rectocele 09/27/2014    . Essential hypertension 06/01/2013  . Hyperlipidemia 06/01/2013  . Paroxysmal atrial fibrillation ([place]) 06/01/2013    Past Surgical History:  Procedure Laterality Date  . [place] VITRECTOMY WITH 20 GAUGE MVR PORT FOR MACULAR HOLE Left 11/22/2014   Procedure: 25 GAUGE PARS PLANA VITRECTOMY WITH 20 GAUGE MVR PORT FOR MACULAR HOLE LEFT EYE ;  Surgeon: Hayden Pedro, MD;  Location: [place];  Service: Ophthalmology;  Laterality: Left;  . CATARACT EXTRACTION, BILATERAL     Dr Herbert Deaner  . COLONOSCOPY    . DILATION AND CURETTAGE OF UTERUS     PT. DOES NOT REMEMBER THE YEAR  . DOPPLER ECHOCARDIOGRAPHY  05/13/2007  . EYE SURGERY Bilateral    cataract surgery with lens implant  . GAS/FLUID EXCHANGE Left 11/22/2014   Procedure: GAS/FLUID EXCHANGE;  Surgeon: Hayden Pedro, MD;  Location: [place];  Service: Ophthalmology;  Laterality: Left;  C3F8  . LASER PHOTO ABLATION Left 11/22/2014   Procedure: LASER PHOTO ABLATION;  Surgeon: Hayden Pedro, MD;  Location: [place];  Service: Ophthalmology;  Laterality: Left;  Headscope laser  . MEMBRANE PEEL Left 11/22/2014   Procedure: MEMBRANE PEEL;  Surgeon: Hayden Pedro, MD;  Location: [place];  Service: Ophthalmology;  Laterality: Left;  . myoview   06/05/2009  . SERUM PATCH Left 11/22/2014   Procedure: SERUM PATCH;  Surgeon: Hayden Pedro, MD;  Location: [place];  Service:  Ophthalmology;  Laterality: Left;    OB History    Gravida Para Term Preterm AB Living   3 3 3     3    SAB TAB Ectopic Multiple Live Births                   Home Medications    Prior to Admission medications   Medication Sig Start Date End Date Taking? Authorizing Provider  aspirin 81 MG tablet Take 81 mg by mouth daily.     Historical Provider, MD  Calcium Carbonate-Vitamin D (CALCIUM + D PO) Take 1 tablet by mouth daily.     Historical Provider, MD  Estradiol (VAGIFEM) 10 MCG TABS vaginal tablet Place 1 tablet (10 mcg total) vaginally 2 (two) times a week. 09/28/15    Anastasio Auerbach, MD  hydrochlorothiazide (HYDRODIURIL) 25 MG tablet Take 25 mg by mouth daily.    Historical Provider, MD  lovastatin (MEVACOR) 20 MG tablet Take 1 tablet (20 mg total) by mouth daily at 6 PM. 09/21/15   Lorretta Harp, MD  Magnesium 400 MG CAPS Take 400 mg by mouth daily.     Historical Provider, MD  metoprolol (LOPRESSOR) 50 MG tablet TAKE ONE-HALF TABLET BY MOUTH TWICE DAILY 04/30/16   Lorretta Harp, MD  Omega-3 Fatty Acids (FISH OIL) 1200 MG CAPS Take 1,200 mg by mouth daily.     Historical Provider, MD  VAGIFEM 10 MCG TABS vaginal tablet INSERT ONE TABLET VAGINALLY TWICE A WEEK 10/04/15   Anastasio Auerbach, MD  vitamin B-12 (CYANOCOBALAMIN) 1000 MCG tablet Take 1,000 mcg by mouth daily.    Historical Provider, MD    Family History Family History  Problem Relation Age of Onset  . Stroke Mother   . Heart disease Mother   . Kidney disease Mother   . Hypertension Mother   . Hyperlipidemia Mother   . Stroke Brother   . Heart disease Brother   . Stroke Brother   . Heart disease Brother   . Heart failure Sister   . Cancer Sister     Esophagus/stomach  . Stroke Sister   . Heart disease Sister   . Heart disease Father   . Colon cancer Neg Hx   . Rectal cancer Neg Hx     Social History Social History  Substance Use Topics  . Smoking status: Never Smoker  . Smokeless tobacco: Never Used  . Alcohol use 0.0 oz/week     Comment: RARELY     Allergies   Penicillins and Prednisone   Review of Systems Review of Systems   Physical Exam Updated Vital Signs BP 148/63   Pulse (!) 49   Temp 97.3 F (36.3 C) (Oral)   Resp 11   Ht 5\' 6"  (1.676 m)   Wt 163 lb (73.9 kg)   SpO2 98%   BMI 26.31 kg/m   Physical Exam  Constitutional: She is oriented to person, place, and time. She appears well-developed and well-nourished. No distress.  HENT:  Head: Normocephalic and atraumatic.  Eyes: Pupils are equal, round, and reactive to light.  Neck: Neck supple.    Cardiovascular: Normal rate.   Pulmonary/Chest: Effort normal. No respiratory distress.  Musculoskeletal: Normal range of motion.  Neurological: She is alert and oriented to person, place, and time. Coordination normal.  Skin: Skin is warm and dry. She is not diaphoretic.  Psychiatric: She has a normal mood and affect. Her behavior is normal.  Nursing note and vitals reviewed.  ED Treatments / Results  DIAGNOSTIC STUDIES: Oxygen Saturation is 100% on RA, normal by my interpretation.    COORDINATION OF CARE: 10:27 AM Discussed treatment plan with pt at bedside and pt agreed to plan.  Labs (all labs ordered are listed, but only abnormal results are displayed) Labs Reviewed  CBC - Abnormal; Notable for the following:       Result Value   RBC 5.18 (*)    Hemoglobin 15.1 (*)    All other components within normal limits  BASIC METABOLIC PANEL  TROPONIN I    EKG  EKG Interpretation  Date/Time:  Friday May 24 2016 09:13:54 EST Ventricular Rate:  47 PR Interval:  212 QRS Duration: 68 QT Interval:  476 QTC Calculation: 421 R Axis:   65 Text Interpretation:  Sinus bradycardia with 1st degree A-V block with Premature atrial complexes Otherwise normal ECG Confirmed by Lacinda Axon  MD, Joeline Freer ([id number]) on 05/24/2016 11:39:09 AM       Radiology Dg Chest 2 View  Result Date: 05/24/2016 CLINICAL DATA:  Chest tightness for 1 month EXAM: CHEST  2 VIEW COMPARISON:  07/05/2005 FINDINGS: Upper normal heart size. Interstitial prominence is stay pneumothorax or pleural effusion. No lung mass or consolidation. IMPRESSION: Chronic changes.  No active cardiopulmonary disease. Electronically Signed   By: Marybelle Killings M.D.   On: 05/24/2016 10:04    Procedures Procedures (including critical care time)  Medications Ordered in ED Medications - No data to display   Initial Impression / Assessment and Plan / ED Course  I have reviewed the triage vital signs and the nursing notes.  Pertinent  labs & imaging results that were available during my care of the patient were reviewed by me and considered in my medical decision making (see chart for details).    Patient presents with chest pain intermittently for one month getting worse. She visited her primary care doctor today and was transferred to the emergency department. She has hypertension, hypercholesterolemia, several first-degree relatives with cardiac disease. Patient is hemodynamically stable. EKG and troponin showed no acute changes. Discussed with Dr. Harl Bowie in cardiology. Admit to observation Dr Jerilee Hoh   Final Clinical Impressions(s) / ED Diagnoses   Final diagnoses:  Chest pain, unspecified type    New Prescriptions New Prescriptions   No medications on file     I personally performed the services described in this documentation, which was scribed in my presence. The recorded information has been reviewed and is accurate.         Nat Christen, MD 05/24/16 1213

## 2016-05-24 NOTE — Consult Note (Addendum)
CARDIOLOGY CONSULT NOTE   Patient ID: Brittany Weaver MRN: [id number] DOB/AGE: October 16, 1940 76 y.o.  Admit Date: 05/24/2016 Referring Physician: Erasmo Score MD Primary Physician: [place] Consulting Cardiologist: Carlyle Dolly MD Primary Cardiologist: Quay Burow MD Reason for Consultation: Chest Pain  Clinical Summary Brittany Weaver is a 77 y.o.female with known history of hypertension, hyperlipidemia, PAF, not on anticoagulation per Dr. Kennon Holter note on 08/16/2015, who presented to the ER with complaints of chest tightness.   She reports over the last month to treat week she has been having chest tightness. It is not associated with eating, activity, and usually lasts about an hour and goes away on its own. The pain does radiate to her right shoulder and shoulder blade and up into the right side of her neck. It is not associated with diaphoresis or dizziness nor is it associated with weakness or near syncope. The patient is normally very active working in her church and taking care of grandchildren. This is not slowed her down. She states this is discomfort in her chest usually goes away when she rests by lying down.  Last night while she was lying down with her granddaughter she began to have chest tightness substernal. She just taking her evening medication. The tightness was more severe than she normally experiences with radiation. She got up out of bed and went downstairs and took her blood pressure and found it to be extremely elevated at 186/96. She rested a while he continues to take her blood pressure which normalized without any intervention. The patient went to bed even though she continued to have some mild chest tightness. This did not keep her awake. When she awoke this morning the chest tightness remained and she went to her primary care physician's office, Brittany Weaver. Brittany Weaver did an EKG and found to be bradycardic, he also gave her one sublingual  nitroglycerin and sent her to the emergency room.  On arrival to ER BP 175/75, HR 51, O2 Sat 100%. Afebrile. EKG revealed sinus bradycardia, 1st degree AVB, and occasional PAC's. Chemistries were unremarkable. Troponin 0.03, Hgb 15.1, Hct 44. 0. CXR negative for CHF or pneumonia.   On review of patient's home medications, she she is on metoprolol 25 mg twice a day for heart rate control. Also of note, the patient has recently had a cyst removed from her upper right back at the scapula area. And has recently been started on clindamycin which she is to take for 10 days, having taken it for the last 3 days.  Allergies  Allergen Reactions  . Penicillins Hives    HIVES  . Prednisone Other (See Comments)    DIZZINESS and swelling    Medications Scheduled Medications:    Infusions:    PRN Medications:     Past Medical History:  Diagnosis Date  . Hypercholesterolemia   . Hypertension   . Macular hole of left eye   . Mitral valve prolapse    no issues  . Osteopenia 2013   T score -1.4 stable from prior DEXA 2007 no FRAX calculated  . Paroxysmal atrial fibrillation (HCC)   . Pelvic relaxation    Mild and asymptomatic.  Marland Kitchen PONV (postoperative nausea and vomiting)    "little bit of nausea" after cataract surgery    Past Surgical History:  Procedure Laterality Date  . [place] VITRECTOMY WITH 20 GAUGE MVR PORT FOR MACULAR HOLE Left 11/22/2014   Procedure: 25 GAUGE PARS PLANA VITRECTOMY WITH  20 GAUGE MVR PORT FOR MACULAR HOLE LEFT EYE ;  Surgeon: Hayden Pedro, MD;  Location: [facility];  Service: Ophthalmology;  Laterality: Left;  . CATARACT EXTRACTION, BILATERAL     Dr Herbert Deaner  . COLONOSCOPY    . DILATION AND CURETTAGE OF UTERUS     PT. DOES NOT REMEMBER THE YEAR  . DOPPLER ECHOCARDIOGRAPHY  05/13/2007  . EYE SURGERY Bilateral    cataract surgery with lens implant  . GAS/FLUID EXCHANGE Left 11/22/2014   Procedure: GAS/FLUID EXCHANGE;  Surgeon: Hayden Pedro, MD;   Location: [facility];  Service: Ophthalmology;  Laterality: Left;  C3F8  . LASER PHOTO ABLATION Left 11/22/2014   Procedure: LASER PHOTO ABLATION;  Surgeon: Hayden Pedro, MD;  Location: [facility];  Service: Ophthalmology;  Laterality: Left;  Headscope laser  . MEMBRANE PEEL Left 11/22/2014   Procedure: MEMBRANE PEEL;  Surgeon: Hayden Pedro, MD;  Location: [facility];  Service: Ophthalmology;  Laterality: Left;  . myoview   06/05/2009  . SERUM PATCH Left 11/22/2014   Procedure: SERUM PATCH;  Surgeon: Hayden Pedro, MD;  Location: [place];  Service: Ophthalmology;  Laterality: Left;    Family History  Problem Relation Age of Onset  . Stroke Mother   . Heart disease Mother   . Kidney disease Mother   . Hypertension Mother   . Hyperlipidemia Mother   . Stroke Brother   . Heart disease Brother   . Stroke Brother   . Heart disease Brother   . Heart failure Sister   . Cancer Sister     Esophagus/stomach  . Stroke Sister   . Heart disease Sister   . Heart disease Father   . Colon cancer Neg Hx   . Rectal cancer Neg Hx     Social History Brittany Weaver reports that she has never smoked. She has never used smokeless tobacco. Brittany Weaver reports that she drinks alcohol.  Review of Systems Complete review of systems are found to be negative unless outlined in H&P above.  Physical Examination Blood pressure 147/57, pulse (!) 42, temperature 97.3 F (36.3 C), temperature source Oral, resp. rate 16, height 5\' 6"  (1.676 m), weight 163 lb (73.9 kg), SpO2 98 %. No intake or output data in the 24 hours ending 05/24/16 1132  Telemetry:Sinus bradycardia heart rate 45-47 bpm  GEN: No acute distress complaining of mild chest tightness HEENT: Conjunctiva and lids normal, oropharynx clear with moist mucosa. Neck: Supple, no elevated JVP, right  carotid bruits, no thyromegaly. Lungs: Clear to auscultation, nonlabored breathing at rest. Cardiac: Regular rate and rhythm, bradycardic, no S3 or significant systolic  murmur, no pericardial rub. Abdomen: Soft, nontender, no hepatomegaly, bowel sounds present, no guarding or rebound. Extremities: No pitting edema, distal pulses 2+. Skin: Warm and dry. Musculoskeletal: No kyphosis. Neuropsychiatric: Alert and oriented x3, affect grossly appropriate.  Prior Cardiac Testing/Procedures Echocardiogram 05/13/2007 (Transcribed from scanned document)  The study was technically difficult.  Left ventricular systolic function was normal The aortic valve is normal in structure and function There is no evidence of mitral valve prolapse. The mitral valve leaflets appear thickened, but open well There is trace mitral regurgitation.   NM Stress Test 06/05/2009(Transcirbed from scanned document) The post stress myocardial perfusion images show a normal pattern of perfusion in all regions The post stress left ventricle is normal in size. There is not scintigraphic evidence of inducible myocardial ischemia The post stress ejection fraction is 84%. Global left ventricular systolic function is  normal  No significant wall motion abnormalities Exercise capacity 7 METS. Exercise capacity is norma.  No EKG changes. Normal myocardial perfusion study. No significant ischemia demonstrated.  This is a low risk scan.  Compared to previous study, there is no significant changes.    Lab Results  Basic Metabolic Panel:  Recent Labs Lab 05/24/16 1005  NA 136  K 3.7  CL 102  CO2 24  GLUCOSE 95  BUN 19  CREATININE 0.90  CALCIUM 10.1    Liver Function Tests: No results for input(s): AST, ALT, ALKPHOS, BILITOT, PROT, ALBUMIN in the last 168 hours.  CBC:  Recent Labs Lab 05/24/16 1005  WBC 7.7  HGB 15.1*  HCT 44.0  MCV 84.9  PLT 208    Cardiac Enzymes:  Recent Labs Lab 05/24/16 1005  TROPONINI <0.03    Radiology: Dg Chest 2 View  Result Date: 05/24/2016 CLINICAL DATA:  Chest tightness for 1 month EXAM: CHEST  2 VIEW COMPARISON:  07/05/2005 FINDINGS:  Upper normal heart size. Interstitial prominence is stay pneumothorax or pleural effusion. No lung mass or consolidation. IMPRESSION: Chronic changes.  No active cardiopulmonary disease. Electronically Signed   By: Marybelle Killings M.D.   On: 05/24/2016 10:04     ECG: Sinus bradycardia with PACs, first-degree AV block   Impression and Recommendations  1. Marked bradycardia: Heart rate in the 40s. The patient took her medications this morning which did include metoprolol 25 mg. She continues to have some mild chest tightness but no associated shortness of breath and dizziness or increased fatigue.  Check TSH.  2. Chest tightness: Initial troponin is negative. Continue to cycle enzymes. There are no acute ST-T wave abnormalities at this time other than bradycardia. . She continues to have some mild tightness in her chest now but not severe. The tightness never has gone away since last night. Only improved in intensity.  Cardiovascular risk factors include family history of heart disease, hypertension, hypercholesterolemia. The patient has had a normal stress test in 2011. Recommend admission for observation, continue to cycle enzymes, consider stress test as inpatient vs outpatient pending initial workup.   3. History of paroxysmal atrial fibrillation: She states she can tell when she is in atrial fibrillation, has been having flutters on and off for the last month. Nonsustained. Asymptomatic. She is not on anticoagulation, but remains on aspirin 81 mg daily. CHADS Score of 3. May need DOAC due to self reported flutters. Will discuss with Dr. Harl Bowie before starting.   4. Hypertension: Self-reported hypertension last evening when she was experiencing chest discomfort. Blood pressure is moderately controlled today in the ER, which she states is elevated for her. Review of home medications has her on hydrochlorothiazide 25 mg daily along with metoprolol 25 mg twice a day. K+ 3.7. Creatinine 0.90   5.  Hypercholesterolemia: Remains on statin therapy.  Signed: Phill Myron. Lawrence NP [place]  05/24/2016, 11:32 AM Co-Sign MD  Attending Note Patient seen and discussed with NP Purcell Nails, I agree with her documentation above. 76 yo female history of HTN, HL, PAF having refused anticoagulation presents with chest pain.   Chest pain started about 1 month ago. Tightness midchest that can radiate into left chest, can send shooting pains to right shoulder and neck/jaw. 7/10 in severity. No associated SOb. Can occur at rest or with activity. Not positional,no relation to food. Can last up to 1-2 hours, occurring several times a week.    Nuclear stress 2011 no ischemia Echo pending EKG sinus  brady 1st degree av block, no acute ischemic changes K 3.7 Cr 0.9 Hgb 15.1 Plt 208 Trop neg x 1 CXR no acute process  Patient presents with chest pain, description of symptoms are not overally consistent with cardiac ischemia. Would cylce enzymes and EKG overnight, we will obtain echo. If unremarkable workup would plan likely for outpatient stress testing. Regarding her afib, she is rate controlled. Records show chronic sinus bradycardia that has been asymptomatic. I would keep her lopressor at current dose. She actually reports an increase in palpitations over the last several weeks, she may require an outpatient monitor to evaluate for afib episodes. If noted would need to consider antiarrhthmic strategy as she will not tolerate higher doses of av nodal agents. She continues to refuse anticoagulation though will readdress at her outpatient f/u with her primary cardiologist.   Carlyle Dolly MD

## 2016-05-24 NOTE — ED Triage Notes (Signed)
Pt comes in for chest pain that has persisted for 2 months but got worse last night. Pt went to Dr. Cornelia Copa office this morning and they have her 1 nitro and sent here. Pt states she has no pain at presents, pain was rated at a 1 at most. NAD noted.

## 2016-05-24 NOTE — H&P (Signed)
History and Physical    Brittany Weaver [id number] DOB: April 03, 1941 DOA: 05/24/2016  Referring MD/NP/PA: Nat Christen, EDP PCP: [facility]  Patient coming from: Home  Chief Complaint: Chest pain  HPI: Brittany Weaver is a 76 y.o. female who comes in from home today complaining of chest pain. She was lying in bed taking her evening medications at around 10:30 last night when she felt pain over the center of her chest that migrated up towards the base of her neck. She describes it as if somebody were putting a blade inside her chest. Pain did not radiate otherwise, chest pain has improved throughout the day especially after receiving the nitroglycerin this morning at PCPs office although has not completely disappeared. She denies any dizziness, diaphoresis, palpitations. In the ED and EKG is significant for sinus bradycardia but no acute ischemic abnormalities, first troponin is negative. She has a history of paroxysmal atrial fibrillation, currently in sinus bradycardia. Last took her metoprolol this morning.  Past Medical/Surgical History: Past Medical History:  Diagnosis Date  . Hypercholesterolemia   . Hypertension   . Macular hole of left eye   . Mitral valve prolapse    no issues  . Osteopenia 2013   T score -1.4 stable from prior DEXA 2007 no FRAX calculated  . Paroxysmal atrial fibrillation (HCC)   . Pelvic relaxation    Mild and asymptomatic.  Marland Kitchen PONV (postoperative nausea and vomiting)    "little bit of nausea" after cataract surgery    Past Surgical History:  Procedure Laterality Date  . [place] VITRECTOMY WITH 20 GAUGE MVR PORT FOR MACULAR HOLE Left 11/22/2014   Procedure: 25 GAUGE PARS PLANA VITRECTOMY WITH 20 GAUGE MVR PORT FOR MACULAR HOLE LEFT EYE ;  Surgeon: Hayden Pedro, MD;  Location: [facility];  Service: Ophthalmology;  Laterality: Left;  . CATARACT EXTRACTION, BILATERAL     Dr Herbert Deaner  . COLONOSCOPY    . DILATION AND CURETTAGE OF UTERUS      PT. DOES NOT REMEMBER THE YEAR  . DOPPLER ECHOCARDIOGRAPHY  05/13/2007  . EYE SURGERY Bilateral    cataract surgery with lens implant  . GAS/FLUID EXCHANGE Left 11/22/2014   Procedure: GAS/FLUID EXCHANGE;  Surgeon: Hayden Pedro, MD;  Location: [facility];  Service: Ophthalmology;  Laterality: Left;  C3F8  . LASER PHOTO ABLATION Left 11/22/2014   Procedure: LASER PHOTO ABLATION;  Surgeon: Hayden Pedro, MD;  Location: [facility];  Service: Ophthalmology;  Laterality: Left;  Headscope laser  . MEMBRANE PEEL Left 11/22/2014   Procedure: MEMBRANE PEEL;  Surgeon: Hayden Pedro, MD;  Location: [facility];  Service: Ophthalmology;  Laterality: Left;  . myoview   06/05/2009  . SERUM PATCH Left 11/22/2014   Procedure: SERUM PATCH;  Surgeon: Hayden Pedro, MD;  Location: [facility];  Service: Ophthalmology;  Laterality: Left;    Social History:  reports that she has never smoked. She has never used smokeless tobacco. She reports that she drinks alcohol. She reports that she does not use drugs.  Allergies: Allergies  Allergen Reactions  . Penicillins Hives    Has patient had a PCN reaction causing immediate rash, facial/tongue/throat swelling, SOB or lightheadedness with hypotension: unknown Has patient had a PCN reaction causing severe rash involving mucus membranes or skin necrosis: unknown Has patient had a PCN reaction that required hospitalization: no Has patient had a PCN reaction occurring within the last 10 years: no If all of the above  answers are "NO", then may proceed with Cephalosporin use.   . Prednisone Other (See Comments)    DIZZINESS and swelling    Family History:  Family History  Problem Relation Age of Onset  . Stroke Mother   . Heart disease Mother   . Kidney disease Mother   . Hypertension Mother   . Hyperlipidemia Mother   . Stroke Brother   . Heart disease Brother   . Stroke Brother   . Heart disease Brother   . Heart failure Sister   . Cancer Sister      Esophagus/stomach  . Stroke Sister   . Heart disease Sister   . Heart disease Father   . Colon cancer Neg Hx   . Rectal cancer Neg Hx     Prior to Admission medications   Medication Sig Start Date End Date Taking? Authorizing Provider  aspirin 81 MG tablet Take 81 mg by mouth daily.    Yes Historical Provider, MD  Calcium Carbonate-Vitamin D (CALCIUM + D PO) Take 1 tablet by mouth daily.    Yes Historical Provider, MD  clindamycin (CLEOCIN) 300 MG capsule Take 300 mg by mouth 3 (three) times daily.   Yes Historical Provider, MD  Estradiol (VAGIFEM) 10 MCG TABS vaginal tablet Place 1 tablet (10 mcg total) vaginally 2 (two) times a week. 09/28/15  Yes Anastasio Auerbach, MD  hydrochlorothiazide (HYDRODIURIL) 25 MG tablet Take 25 mg by mouth daily.   Yes Historical Provider, MD  lovastatin (MEVACOR) 20 MG tablet Take 1 tablet (20 mg total) by mouth daily at 6 PM. 09/21/15  Yes Lorretta Harp, MD  Magnesium 400 MG CAPS Take 400 mg by mouth daily.    Yes Historical Provider, MD  metoprolol (LOPRESSOR) 50 MG tablet TAKE ONE-HALF TABLET BY MOUTH TWICE DAILY 04/30/16  Yes Lorretta Harp, MD  Omega-3 Fatty Acids (FISH OIL) 1200 MG CAPS Take 1,200 mg by mouth daily.    Yes Historical Provider, MD  vitamin B-12 (CYANOCOBALAMIN) 1000 MCG tablet Take 1,000 mcg by mouth daily.   Yes Historical Provider, MD    Review of Systems:  Constitutional: Denies fever, chills, diaphoresis, appetite change and fatigue.  HEENT: Denies photophobia, eye pain, redness, hearing loss, ear pain, congestion, sore throat, rhinorrhea, sneezing, mouth sores, trouble swallowing, neck pain, neck stiffness and tinnitus.   Respiratory: Denies SOB, DOE, cough  and wheezing.   Cardiovascular: Denies palpitations and leg swelling.  Gastrointestinal: Denies nausea, vomiting, abdominal pain, diarrhea, constipation, blood in stool and abdominal distention.  Genitourinary: Denies dysuria, urgency, frequency, hematuria, flank pain and  difficulty urinating.  Endocrine: Denies: hot or cold intolerance, sweats, changes in hair or nails, polyuria, polydipsia. Musculoskeletal: Denies myalgias, back pain, joint swelling, arthralgias and gait problem.  Skin: Denies pallor, rash and wound.  Neurological: Denies dizziness, seizures, syncope, weakness, light-headedness, numbness and headaches.  Hematological: Denies adenopathy. Easy bruising, personal or family bleeding history  Psychiatric/Behavioral: Denies suicidal ideation, mood changes, confusion, nervousness, sleep disturbance and agitation    Physical Exam: Vitals:   05/24/16 1200 05/24/16 1211 05/24/16 1230 05/24/16 1300  BP: 155/64 155/64 139/62 153/70  Pulse: (!) 52 (!) 43 (!) 43 (!) 54  Resp: 19 10 13 15   Temp:      TempSrc:      SpO2: 100% 100% 100% 100%  Weight:      Height:         Constitutional: NAD, calm, comfortable Eyes: PERRL, lids and conjunctivae normal ENMT: Mucous membranes  are moist. Posterior pharynx clear of any exudate or lesions.Normal dentition.  Neck: normal, supple, no masses, no thyromegaly Respiratory: clear to auscultation bilaterally, no wheezing, no crackles. Normal respiratory effort. No accessory muscle use.  Cardiovascular: Regular rate and rhythm, no murmurs / rubs / gallops. No extremity edema. 2+ pedal pulses. No carotid bruits.  Abdomen: no tenderness, no masses palpated. No hepatosplenomegaly. Bowel sounds positive.  Musculoskeletal: no clubbing / cyanosis. No joint deformity upper and lower extremities. Good ROM, no contractures. Normal muscle tone.  Skin: no rashes, lesions, ulcers. No induration Neurologic: CN 2-12 grossly intact. Sensation intact, DTR normal. Strength 5/5 in all 4.  Psychiatric: Normal judgment and insight. Alert and oriented x 3. Normal mood.    Labs on Admission: I have personally reviewed the following labs and imaging studies  CBC:  Recent Labs Lab 05/24/16 1005  WBC 7.7  HGB 15.1*  HCT 44.0    MCV 84.9  PLT 123XX123   Basic Metabolic Panel:  Recent Labs Lab 05/24/16 1005  NA 136  K 3.7  CL 102  CO2 24  GLUCOSE 95  BUN 19  CREATININE 0.90  CALCIUM 10.1   GFR: Estimated Creatinine Clearance: 54.7 mL/min (by C-G formula based on SCr of 0.9 mg/dL). Liver Function Tests: No results for input(s): AST, ALT, ALKPHOS, BILITOT, PROT, ALBUMIN in the last 168 hours. No results for input(s): LIPASE, AMYLASE in the last 168 hours. No results for input(s): AMMONIA in the last 168 hours. Coagulation Profile: No results for input(s): INR, PROTIME in the last 168 hours. Cardiac Enzymes:  Recent Labs Lab 05/24/16 1005  TROPONINI <0.03   BNP (last 3 results) No results for input(s): PROBNP in the last 8760 hours. HbA1C: No results for input(s): HGBA1C in the last 72 hours. CBG: No results for input(s): GLUCAP in the last 168 hours. Lipid Profile: No results for input(s): CHOL, HDL, LDLCALC, TRIG, CHOLHDL, LDLDIRECT in the last 72 hours. Thyroid Function Tests: No results for input(s): TSH, T4TOTAL, FREET4, T3FREE, THYROIDAB in the last 72 hours. Anemia Panel: No results for input(s): VITAMINB12, FOLATE, FERRITIN, TIBC, IRON, RETICCTPCT in the last 72 hours. Urine analysis:    Component Value Date/Time   COLORURINE YELLOW 09/27/2014 [place] 09/27/2014 1443   LABSPEC <1.005 (L) 09/27/2014 1443   PHURINE 5.0 09/27/2014 1443   GLUCOSEU NEG 09/27/2014 1443   HGBUR NEG 09/27/2014 1443   BILIRUBINUR NEG 09/27/2014 1443   KETONESUR NEG 09/27/2014 1443   PROTEINUR NEG 09/27/2014 1443   UROBILINOGEN 0.2 09/27/2014 1443   NITRITE NEG 09/27/2014 1443   LEUKOCYTESUR LARGE (A) 09/27/2014 1443   Sepsis Labs: @LABRCNTIP (procalcitonin:4,lacticidven:4) )No results found for this or any previous visit (from the past 240 hour(s)).   Radiological Exams on Admission: Dg Chest 2 View  Result Date: 05/24/2016 CLINICAL DATA:  Chest tightness for 1 month EXAM: CHEST  2  VIEW COMPARISON:  07/05/2005 FINDINGS: Upper normal heart size. Interstitial prominence is stay pneumothorax or pleural effusion. No lung mass or consolidation. IMPRESSION: Chronic changes.  No active cardiopulmonary disease. Electronically Signed   By: Marybelle Killings M.D.   On: 05/24/2016 10:04    EKG: Independently reviewed. Sinus bradycardia, but no acute ischemic abnormalities  Assessment/Plan Principal Problem:   Chest pain Active Problems:   Essential hypertension   Hyperlipidemia   Paroxysmal atrial fibrillation (HCC)   Sinus bradycardia    Chest pain -No history of coronary artery disease. -Chest pain is atypical, she certainly has some personal  and family risk factors for coronary artery disease. -Will admit to telemetry, cycle troponins, repeat EKG in a.m. -Cardiology consultation has been requested by EDP. -2-D echo has been requested  Paroxysmal atrial fibrillation -Currently in sinus bradycardia. -She is not anticoagulated although has a CHADSVASC2 score of 3, will discuss with cardiology whether appropriate to start anticoagulation.  Sinus bradycardia -Will hold metoprolol.  Hyperlipidemia -Continue statin.   DVT prophylaxis: Lovenox  Code Status: Full code  Family Communication: Husband and son at bedside updated on plan of care and all questions answered  Disposition Plan: Home likely in 24 hours  Consults called: Cardiology  Admission status: Observation    Time Spent: 75 minutes  Lelon Frohlich MD [facility] Pager [phone]  If 7PM-7AM, please contact night-coverage [email or web address] Password [email or web address]  05/24/2016, 1:27 PM

## 2016-05-25 ENCOUNTER — Observation Stay (HOSPITAL_BASED_OUTPATIENT_CLINIC_OR_DEPARTMENT_OTHER): Payer: Medicare Other

## 2016-05-25 ENCOUNTER — Other Ambulatory Visit (HOSPITAL_COMMUNITY): Payer: Medicare Other

## 2016-05-25 DIAGNOSIS — R079 Chest pain, unspecified: Secondary | ICD-10-CM | POA: Diagnosis not present

## 2016-05-25 DIAGNOSIS — R071 Chest pain on breathing: Secondary | ICD-10-CM | POA: Diagnosis not present

## 2016-05-25 DIAGNOSIS — E784 Other hyperlipidemia: Secondary | ICD-10-CM | POA: Diagnosis not present

## 2016-05-25 DIAGNOSIS — R0789 Other chest pain: Secondary | ICD-10-CM | POA: Diagnosis not present

## 2016-05-25 DIAGNOSIS — I10 Essential (primary) hypertension: Secondary | ICD-10-CM | POA: Diagnosis not present

## 2016-05-25 DIAGNOSIS — Z7982 Long term (current) use of aspirin: Secondary | ICD-10-CM | POA: Diagnosis not present

## 2016-05-25 DIAGNOSIS — Z79899 Other long term (current) drug therapy: Secondary | ICD-10-CM | POA: Diagnosis not present

## 2016-05-25 LAB — TROPONIN I

## 2016-05-25 LAB — ECHOCARDIOGRAM COMPLETE
HEIGHTINCHES: 66 in
Weight: 2607.99 oz

## 2016-05-25 NOTE — Progress Notes (Signed)
  Echocardiogram 2D Echocardiogram has been performed.  Brittany Weaver 05/25/2016, 9:19 AM

## 2016-05-25 NOTE — Discharge Summary (Signed)
Physician Discharge Summary  Brittany Weaver [id number] DOB: 03/18/1941 DOA: 05/24/2016  PCP: [facility] date: 05/24/2016 Discharge date: 05/25/2016  Time spent: 45 minutes  Recommendations for Outpatient Follow-up:  -To be discharged home today. -Advised to follow-up with cardiology in 2 weeks for consideration of outpatient stress test. At time of follow-up results of 2-D echo should be reviewed as they are pending at time of discharge.   Discharge Diagnoses:  Principal Problem:   Chest pain Active Problems:   Essential hypertension   Hyperlipidemia   Paroxysmal atrial fibrillation (HCC)   Sinus bradycardia   Discharge Condition: Stable and improved  Filed Weights   05/24/16 0921 05/24/16 1617  Weight: 73.9 kg (163 lb) 73.9 kg (163 lb)    History of present illness:  Brittany Weaver is a 76 y.o. female who comes in from home today complaining of chest pain. She was lying in bed taking her evening medications at around 10:30 last night when she felt pain over the center of her chest that migrated up towards the base of her neck. She describes it as if somebody were putting a blade inside her chest. Pain did not radiate otherwise, chest pain has improved throughout the day especially after receiving the nitroglycerin this morning at PCPs office although has not completely disappeared. She denies any dizziness, diaphoresis, palpitations. In the ED and EKG is significant for sinus bradycardia but no acute ischemic abnormalities, first troponin is negative. She has a history of paroxysmal atrial fibrillation, currently in sinus bradycardia. Last took her metoprolol this morning.   Hospital Course:   Chest pain -Without history of coronary artery disease, chest pain was atypical in nature. -She has ruled out for ACS with negative troponins and EKG without acute ischemic abnormalities. -2-D echo has been done but results are pending at time of  discharge. -As per cardiology recommendations, will follow-up with him as an outpatient for consideration of a stress test. -She would like to move her cardiological care to the [facility] of [facility] due to limitations in transportation.  Paroxysmal A. Fib -Despite the fact that she meets criteria for anticoagulation, she has refused this in the past. -Cardiology will continue to discuss this with her in the outpatient setting.  Sinus bradycardia/first-degree AV block -Metoprolol has been discontinued for now.  Hyperlipidemia  -continue statin   Procedures:  None   Consultations:  None  Discharge Instructions  Discharge Instructions    Diet - low sodium heart healthy    Complete by:  As directed    Increase activity slowly    Complete by:  As directed      Allergies as of 05/25/2016      Reactions   Penicillins Hives   Has patient had a PCN reaction causing immediate rash, facial/tongue/throat swelling, SOB or lightheadedness with hypotension: unknown Has patient had a PCN reaction causing severe rash involving mucus membranes or skin necrosis: unknown Has patient had a PCN reaction that required hospitalization: no Has patient had a PCN reaction occurring within the last 10 years: no If all of the above answers are "NO", then may proceed with Cephalosporin use.   Prednisone Other (See Comments)   DIZZINESS and swelling      Medication List    STOP taking these medications   clindamycin 300 MG capsule Commonly known as:  CLEOCIN   metoprolol 50 MG tablet Commonly known as:  LOPRESSOR     TAKE these  medications   aspirin 81 MG tablet Take 81 mg by mouth daily.   CALCIUM + D PO Take 1 tablet by mouth daily.   Estradiol 10 MCG Tabs vaginal tablet Commonly known as:  VAGIFEM Place 1 tablet (10 mcg total) vaginally 2 (two) times a week.   Fish Oil 1200 MG Caps Take 1,200 mg by mouth daily.   hydrochlorothiazide 25 MG tablet Commonly known  as:  HYDRODIURIL Take 25 mg by mouth daily.   lovastatin 20 MG tablet Commonly known as:  MEVACOR Take 1 tablet (20 mg total) by mouth daily at 6 PM.   Magnesium 400 MG Caps Take 400 mg by mouth daily.   vitamin B-12 1000 MCG tablet Commonly known as:  CYANOCOBALAMIN Take 1,000 mcg by mouth daily.      Allergies  Allergen Reactions  . Penicillins Hives    Has patient had a PCN reaction causing immediate rash, facial/tongue/throat swelling, SOB or lightheadedness with hypotension: unknown Has patient had a PCN reaction causing severe rash involving mucus membranes or skin necrosis: unknown Has patient had a PCN reaction that required hospitalization: no Has patient had a PCN reaction occurring within the last 10 years: no If all of the above answers are "NO", then may proceed with Cephalosporin use.   . Prednisone Other (See Comments)    DIZZINESS and swelling   Follow-up Information    Carlyle Dolly, MD. Schedule an appointment as soon as possible for a visit in 2 week(s).   Specialty:  Cardiology Contact information: [address] [place] [address] [phone]            The results of significant diagnostics from this hospitalization (including imaging, microbiology, ancillary and laboratory) are listed below for reference.    Significant Diagnostic Studies: Dg Chest 2 View  Result Date: 05/24/2016 CLINICAL DATA:  Chest tightness for 1 month EXAM: CHEST  2 VIEW COMPARISON:  07/05/2005 FINDINGS: Upper normal heart size. Interstitial prominence is stay pneumothorax or pleural effusion. No lung mass or consolidation. IMPRESSION: Chronic changes.  No active cardiopulmonary disease. Electronically Signed   By: Marybelle Killings M.D.   On: 05/24/2016 10:04    Microbiology: No results found for this or any previous visit (from the past 240 hour(s)).   Labs: Basic Metabolic Panel:  Recent Labs Lab 05/24/16 1005  NA 136  K 3.7  CL 102  CO2 24  GLUCOSE 95    BUN 19  CREATININE 0.90  CALCIUM 10.1   Liver Function Tests: No results for input(s): AST, ALT, ALKPHOS, BILITOT, PROT, ALBUMIN in the last 168 hours. No results for input(s): LIPASE, AMYLASE in the last 168 hours. No results for input(s): AMMONIA in the last 168 hours. CBC:  Recent Labs Lab 05/24/16 1005  WBC 7.7  HGB 15.1*  HCT 44.0  MCV 84.9  PLT 208   Cardiac Enzymes:  Recent Labs Lab 05/24/16 1005 05/24/16 1708 05/24/16 2159 05/25/16 0159  TROPONINI <0.03 <0.03 <0.03 <0.03   BNP: BNP (last 3 results) No results for input(s): BNP in the last 8760 hours.  ProBNP (last 3 results) No results for input(s): PROBNP in the last 8760 hours.  CBG: No results for input(s): GLUCAP in the last 168 hours.     SignedLelon Frohlich  [facility] Pager: [phone] 05/25/2016, 1:07 PM

## 2016-05-25 NOTE — Progress Notes (Signed)
Pt discharged home today per Dr. Jerilee Hoh.  Pt's IV site D/C'd and WDL.  Pt's VSS.  Pt provided with home medication list and discharge instructions.  Pt verbalized understanding.  Pt left floor in stable condition accompanied by nursing staff.

## 2016-05-28 ENCOUNTER — Ambulatory Visit (INDEPENDENT_AMBULATORY_CARE_PROVIDER_SITE_OTHER): Payer: Medicare Other | Admitting: Cardiovascular Disease

## 2016-05-28 ENCOUNTER — Encounter: Payer: Self-pay | Admitting: Cardiovascular Disease

## 2016-05-28 VITALS — BP 166/81 | HR 71 | Ht 66.5 in | Wt 162.0 lb

## 2016-05-28 DIAGNOSIS — I480 Paroxysmal atrial fibrillation: Secondary | ICD-10-CM | POA: Diagnosis not present

## 2016-05-28 DIAGNOSIS — I209 Angina pectoris, unspecified: Secondary | ICD-10-CM | POA: Diagnosis not present

## 2016-05-28 DIAGNOSIS — E7800 Pure hypercholesterolemia, unspecified: Secondary | ICD-10-CM | POA: Diagnosis not present

## 2016-05-28 DIAGNOSIS — I259 Chronic ischemic heart disease, unspecified: Secondary | ICD-10-CM

## 2016-05-28 DIAGNOSIS — I10 Essential (primary) hypertension: Secondary | ICD-10-CM | POA: Diagnosis not present

## 2016-05-28 MED ORDER — AMLODIPINE BESYLATE 5 MG PO TABS: 5.0000 mg | ORAL_TABLET | Freq: Every day | ORAL | 3 refills | Status: DC

## 2016-05-28 NOTE — Assessment & Plan Note (Signed)
Brittany Weaver was recently admitted with chest pain rule out myocardial infarction. She was hypertensive and bradycardic. Enzymes were negative. 2-D echo was normal. Her metoprolol was discontinued. I am going to begin her on amlodipine 5 mg a day but for hypertension and chest pain. We will obtain a formal neurologic Myoview stress test to rule out an ischemic etiology. She did have a negative Myoview for 4 years ago. Of note, she is under a lot of stress at home raising 2 grandchildren because of marital issues with her son.

## 2016-05-28 NOTE — Assessment & Plan Note (Signed)
History of hypertension with blood pressure measured today at 166/81. She was previously on metoprolol which was discontinued during her recent auscultation because of bradycardia. I'm going to start her on amlodipine 5 mg a day will see her back in the office in 3-4 weeks for follow-up

## 2016-05-28 NOTE — Progress Notes (Signed)
05/28/2016 Brittany Weaver   Jul 01, 1940  [id number]  Primary Physician [place] Primary Cardiologist: Lorretta Harp MD Lupe Carney, [name]  HPI:  The patient is a 76 year old, thin-appearing, married Caucasian female, mother of 3 who I last saw in the office 08/16/15.Marland Kitchen She has a strong family history of heart disease, as well as hypertension, hyperlipidemia and paroxysmal A-fib. She is a retired Loss adjuster, [employer]. Her last Myoview performed 3 years ago was nonischemic. She denies chest pain or shortness of breath. She does have occasional episodes of breakthrough A-fib with some dizziness but no presyncope. We will discuss oral anticoagulation which she preferred not to be on which I can't disagree with given her relative infrequency of breakthrough episodes. She was recently admitted to [facility] with chest pain and hypertension on 05/24/16 at discharge the next day. She ruled out for myocardial infarction. She was bradycardic and her metoprolol was discontinued. 2-D echo was normal. She is under a lot of stress at home raising 2 grandchildren. She has had continued daily chest pain since her discharge.   Current Outpatient Prescriptions  Medication Sig Dispense Refill  . aspirin 81 MG tablet Take 81 mg by mouth daily.     . Calcium Carbonate-Vitamin D (CALCIUM + D PO) Take 1 tablet by mouth daily.     . Estradiol (VAGIFEM) 10 MCG TABS vaginal tablet Place 1 tablet (10 mcg total) vaginally 2 (two) times a week. 8 tablet 11  . hydrochlorothiazide (HYDRODIURIL) 25 MG tablet Take 25 mg by mouth daily.    Marland Kitchen lovastatin (MEVACOR) 20 MG tablet Take 1 tablet (20 mg total) by mouth daily at 6 PM. 30 tablet 10  . Magnesium 400 MG CAPS Take 400 mg by mouth daily.     . Omega-3 Fatty Acids (FISH OIL) 1200 MG CAPS Take 1,200 mg by mouth daily.     . vitamin B-12 (CYANOCOBALAMIN) 1000 MCG tablet Take 1,000 mcg by mouth daily.    Marland Kitchen amLODipine (NORVASC) 5 MG  tablet Take 1 tablet (5 mg total) by mouth daily. 180 tablet 3   No current facility-administered medications for this visit.     Allergies  Allergen Reactions  . Penicillins Hives    Has patient had a PCN reaction causing immediate rash, facial/tongue/throat swelling, SOB or lightheadedness with hypotension: unknown Has patient had a PCN reaction causing severe rash involving mucus membranes or skin necrosis: unknown Has patient had a PCN reaction that required hospitalization: no Has patient had a PCN reaction occurring within the last 10 years: no If all of the above answers are "NO", then may proceed with Cephalosporin use.   . Prednisone Other (See Comments)    DIZZINESS and swelling    Social History   Social History  . Marital status: Married    Spouse name: N/A  . Number of children: N/A  . Years of education: N/A   Occupational History  . Not on file.   Social History Main Topics  . Smoking status: Never Smoker  . Smokeless tobacco: Never Used  . Alcohol use 0.0 oz/week     Comment: RARELY  . Drug use: No  . Sexual activity: Yes    Birth control/ protection: Post-menopausal     Comment: 1st intercourse 43 yo-1 partner   Other Topics Concern  . Not on file   Social History Narrative  . No narrative on file     Review of Systems: General: negative  for chills, fever, night sweats or weight changes.  Cardiovascular: negative for chest pain, dyspnea on exertion, edema, orthopnea, palpitations, paroxysmal nocturnal dyspnea or shortness of breath Dermatological: negative for rash Respiratory: negative for cough or wheezing Urologic: negative for hematuria Abdominal: negative for nausea, vomiting, diarrhea, bright red blood per rectum, melena, or hematemesis Neurologic: negative for visual changes, syncope, or dizziness All other systems reviewed and are otherwise negative except as noted above.    Blood pressure (!) 166/81, pulse 71, height 5' 6.5" (1.689  m), weight 162 lb (73.5 kg), SpO2 99 %.  General appearance: alert and no distress Neck: no adenopathy, no carotid bruit, no JVD, supple, symmetrical, trachea midline and thyroid not enlarged, symmetric, no tenderness/mass/nodules Lungs: clear to auscultation bilaterally Heart: regular rate and rhythm, S1, S2 normal, no murmur, click, rub or gallop Extremities: extremities normal, atraumatic, no cyanosis or edema  EKG not performed today  ASSESSMENT AND PLAN:   Essential hypertension History of hypertension with blood pressure measured today at 166/81. She was previously on metoprolol which was discontinued during her recent auscultation because of bradycardia. I'm going to start her on amlodipine 5 mg a day will see her back in the office in 3-4 weeks for follow-up  Hyperlipidemia History of hyperlipidemia on statin therapy with recent lipid profile performed 06/17/14 revealing an LDL 67 and HDL 60  Paroxysmal atrial fibrillation (HCC) History of PAF in the past, none documented recently. Oral and hydration was discussed with the patient but she has declined  Chest pain Ms Popke was recently admitted with chest pain rule out myocardial infarction. She was hypertensive and bradycardic. Enzymes were negative. 2-D echo was normal. Her metoprolol was discontinued. I am going to begin her on amlodipine 5 mg a day but for hypertension and chest pain. We will obtain a formal neurologic Myoview stress test to rule out an ischemic etiology. She did have a negative Myoview for 4 years ago. Of note, she is under a lot of stress at home raising 2 grandchildren because of marital issues with her son.      Lorretta Harp MD FACP,FACC,FAHA, [facility] 05/28/2016 2:35 PM

## 2016-05-28 NOTE — Assessment & Plan Note (Signed)
History of PAF in the past, none documented recently. Oral and hydration was discussed with the patient but she has declined

## 2016-05-28 NOTE — Assessment & Plan Note (Signed)
History of hyperlipidemia on statin therapy with recent lipid profile performed 06/17/14 revealing an LDL 67 and HDL 60

## 2016-05-28 NOTE — Patient Instructions (Signed)
Medication Instructions: START Amlodipine 5 mg daily.   Testing/Procedures: Your physician has requested that you have a lexiscan myoview. For further information please visit [email or web address]. Please follow instruction sheet, as given.  Follow-Up: Your physician recommends that you schedule a follow-up appointment with Dr. Gwenlyn Found after testing is completed.   Any Other Special Instructions will be listed below:  Pharmacologic Stress Electrocardiogram A pharmacologic stress electrocardiogram is a heart (cardiac) test that uses nuclear imaging to evaluate the blood supply to your heart. This test may also be called a pharmacologic stress electrocardiography. Pharmacologic means that a medicine is used to increase your heart rate and blood pressure.  This stress test is done to find areas of poor blood flow to the heart by determining the extent of coronary artery disease (CAD). Some people exercise on a treadmill, which naturally increases the blood flow to the heart. For those people unable to exercise on a treadmill, a medicine is used. This medicine stimulates your heart and will cause your heart to beat harder and more quickly, as if you were exercising.  Pharmacologic stress tests can help determine:  The adequacy of blood flow to your heart during increased levels of activity in order to clear you for discharge home.  The extent of coronary artery blockage caused by CAD.  Your prognosis if you have suffered a heart attack.  The effectiveness of cardiac procedures done, such as an angioplasty, which can increase the circulation in your coronary arteries.  Causes of chest pain or pressure. LET [facility] CARE PROVIDER KNOW ABOUT:  Any allergies you have.  All medicines you are taking, including vitamins, herbs, eye drops, creams, and over-the-counter medicines.  Previous problems you or members of your family have had with the use of anesthetics.  Any blood disorders you  have.  Previous surgeries you have had.  Medical conditions you have.  Possibility of pregnancy, if this applies.  If you are currently breastfeeding. RISKS AND COMPLICATIONS Generally, this is a safe procedure. However, as with any procedure, complications can occur. Possible complications include:  You develop pain or pressure in the following areas:  Chest.  Jaw or neck.  Between your shoulder blades.  Radiating down your left arm.  Headache.  Dizziness or light-headedness.  Shortness of breath.  Increased or irregular heartbeat.  Low blood pressure.  Nausea or vomiting.  Flushing.  Redness going up the arm and slight pain during injection of medicine.  Heart attack (rare). BEFORE THE PROCEDURE   Avoid all forms of caffeine for 24 hours before your test or as directed by your health care provider. This includes coffee, tea (even decaffeinated tea), caffeinated sodas, chocolate, cocoa, and certain pain medicines.  Follow your health care provider's instructions regarding eating and drinking before the test.  Take your medicines as directed at regular times with water unless instructed otherwise. Exceptions may include:  If you have diabetes, ask how you are to take your insulin or pills. It is common to adjust insulin dosing the morning of the test.  If you are taking beta-blocker medicines, it is important to talk to your health care provider about these medicines well before the date of your test. Taking beta-blocker medicines may interfere with the test. In some cases, these medicines need to be changed or stopped 24 hours or more before the test.  If you wear a nitroglycerin patch, it may need to be removed prior to the test. Ask your health care provider if the patch should be  removed before the test.  If you use an inhaler for any breathing condition, bring it with you to the test.  If you are an outpatient, bring a snack so you can eat right after the  stress phase of the test.  Do not smoke for 4 hours prior to the test or as directed by your health care provider.  Do not apply lotions, powders, creams, or oils on your chest prior to the test.  Wear comfortable shoes and clothing. Let your health care provider know if you were unable to complete or follow the preparations for your test. PROCEDURE   Multiple patches (electrodes) will be put on your chest. If needed, small areas of your chest may be shaved to get better contact with the electrodes. Once the electrodes are attached to your body, multiple wires will be attached to the electrodes, and your heart rate will be monitored.  An IV access will be started. A nuclear trace (isotope) is given. The isotope may be given intravenously, or it may be swallowed. Nuclear refers to several types of radioactive isotopes, and the nuclear isotope lights up the arteries so that the nuclear images are clear. The isotope is absorbed by your body. This results in low radiation exposure.  A resting nuclear image is taken to show how your heart functions at rest.  A medicine is given through the IV access.  A second scan is done about 1 hour after the medicine injection and determines how your heart functions under stress.  During this stress phase, you will be connected to an electrocardiogram machine. Your blood pressure and oxygen levels will be monitored. AFTER THE PROCEDURE   Your heart rate and blood pressure will be monitored after the test.  You may return to your normal schedule, including diet,activities, and medicines, unless your health care provider tells you otherwise. This information is not intended to replace advice given to you by your health care provider. Make sure you discuss any questions you have with your health care provider. Document Released: 09/01/2008 Document Revised: 04/20/2013 Document Reviewed: 12/21/2012 Elsevier Interactive Patient Education  2017 [employer].   If you need a refill on your cardiac medications before your next appointment, please call your pharmacy.

## 2016-06-04 ENCOUNTER — Telehealth (HOSPITAL_COMMUNITY): Payer: Self-pay

## 2016-06-04 NOTE — Telephone Encounter (Signed)
Encounter complete. 

## 2016-06-06 ENCOUNTER — Ambulatory Visit (HOSPITAL_COMMUNITY)
Admission: RE | Admit: 2016-06-06 | Discharge: 2016-06-06 | Disposition: A | Discharge status: Home / Self Care | Payer: Medicare Other | Source: Ambulatory Visit | Attending: Cardiology | Admitting: Cardiology

## 2016-06-06 DIAGNOSIS — I480 Paroxysmal atrial fibrillation: Secondary | ICD-10-CM | POA: Insufficient documentation

## 2016-06-06 LAB — MYOCARDIAL PERFUSION IMAGING
CHL CUP NUCLEAR SDS: 3
CHL CUP NUCLEAR SRS: 4
CHL CUP RESTING HR STRESS: 62 {beats}/min
CSEPPHR: 103 {beats}/min
LV dias vol: 52 mL (ref 46–106)
LVSYSVOL: 13 mL
SSS: 6
TID: 1.11

## 2016-06-06 MED ORDER — TECHNETIUM TC 99M TETROFOSMIN IV KIT
31.3000 | PACK | Freq: Once | INTRAVENOUS | Status: AC | PRN
  Administered 2016-06-06: 31.3 via INTRAVENOUS
  Filled 2016-06-06: qty 32

## 2016-06-06 MED ORDER — REGADENOSON 0.4 MG/5ML IV SOLN
0.4000 mg | Freq: Once | INTRAVENOUS | Status: AC
  Administered 2016-06-06: 0.4 mg via INTRAVENOUS

## 2016-06-06 MED ORDER — TECHNETIUM TC 99M TETROFOSMIN IV KIT
10.2000 | PACK | Freq: Once | INTRAVENOUS | Status: AC | PRN
  Administered 2016-06-06: 10.2 via INTRAVENOUS
  Filled 2016-06-06: qty 11

## 2016-06-06 MED ORDER — AMINOPHYLLINE 25 MG/ML IV SOLN
75.0000 mg | Freq: Once | INTRAVENOUS | Status: AC
  Administered 2016-06-06: 75 mg via INTRAVENOUS

## 2016-06-12 ENCOUNTER — Ambulatory Visit (INDEPENDENT_AMBULATORY_CARE_PROVIDER_SITE_OTHER): Payer: Medicare Other | Admitting: Cardiovascular Disease

## 2016-06-12 ENCOUNTER — Encounter: Payer: Self-pay | Admitting: Cardiovascular Disease

## 2016-06-12 DIAGNOSIS — E7800 Pure hypercholesterolemia, unspecified: Secondary | ICD-10-CM | POA: Diagnosis not present

## 2016-06-12 DIAGNOSIS — I10 Essential (primary) hypertension: Secondary | ICD-10-CM | POA: Diagnosis not present

## 2016-06-12 DIAGNOSIS — I480 Paroxysmal atrial fibrillation: Secondary | ICD-10-CM

## 2016-06-12 DIAGNOSIS — I208 Other forms of angina pectoris: Secondary | ICD-10-CM

## 2016-06-12 MED ORDER — RIVAROXABAN 20 MG PO TABS: 20.0000 mg | ORAL_TABLET | Freq: Every evening | ORAL | 6 refills | Status: DC

## 2016-06-12 NOTE — Addendum Note (Signed)
Addended by: Therisa Doyne on: 06/12/2016 12:49 PM   Modules accepted: Orders

## 2016-06-12 NOTE — Assessment & Plan Note (Signed)
History of hyperlipidemia on lovastatin followed by her PCP 

## 2016-06-12 NOTE — Assessment & Plan Note (Signed)
History of hypertension with blood pressure measured at 152/74. She is on amlodipine and hydrochlorothiazide. Continue current meds at current dosing.

## 2016-06-12 NOTE — Assessment & Plan Note (Signed)
History of atypical chest pain with a recent negative pharmacologic Myoview stress test performed 06/06/16. Her chest pain has since resolved

## 2016-06-12 NOTE — Patient Instructions (Signed)

## 2016-06-12 NOTE — Assessment & Plan Note (Signed)
History of paroxysmal atrial fibrillation. This patients CHA2DS2-VASc Score and unadjusted Ischemic Stroke Rate (% per year) is equal to 4.8 % stroke rate/year from a score of 4. I'm going to start her on novel oral anticoagulant.  Above score calculated as 1 point each if present [CHF, HTN, DM, Vascular=MI/PAD/Aortic Plaque, Age if 65-74, or Female] Above score calculated as 2 points each if present [Age > 75, or Stroke/TIA/TE]

## 2016-06-12 NOTE — Progress Notes (Signed)
06/12/2016 Brittany Weaver   08/17/1940  [id number]  Primary Physician [place] Primary Cardiologist: Lorretta Harp MD Lupe Carney, [name]  HPI:  The patient is a 76 year old, thin-appearing, married Caucasian female, mother of 3 who I last saw in the office 05/28/16.Marland Kitchen She has a strong family history of heart disease, as well as hypertension, hyperlipidemia and paroxysmal A-fib. She is a retired Loss adjuster, [employer]. Her last Myoview performed 3 years ago was nonischemic. She denies chest pain or shortness of breath. She does have occasional episodes of breakthrough A-fib with some dizziness but no presyncope. We will discuss oral anticoagulation which she preferred not to be on which I can't disagree with given her relative infrequency of breakthrough episodes. She was recently admitted to [facility] with chest pain and hypertension on 05/24/16 at discharge the next day. She ruled out for myocardial infarction. She was bradycardic and her metoprolol was discontinued. 2-D echo was normal. She is under a lot of stress at home raising 2 grandchildren. Her chest pain has since resolved since I saw her in the office 2 weeks ago. A pharmacologic Myoview stress test performed 06/06/16 was nonischemic.   Current Outpatient Prescriptions  Medication Sig Dispense Refill  . amLODipine (NORVASC) 5 MG tablet Take 1 tablet (5 mg total) by mouth daily. 180 tablet 3  . aspirin 81 MG tablet Take 81 mg by mouth daily.     . Calcium Carbonate-Vitamin D (CALCIUM + D PO) Take 1 tablet by mouth daily.     . Estradiol (VAGIFEM) 10 MCG TABS vaginal tablet Place 1 tablet (10 mcg total) vaginally 2 (two) times a week. 8 tablet 11  . hydrochlorothiazide (HYDRODIURIL) 25 MG tablet Take 25 mg by mouth daily.    Marland Kitchen lovastatin (MEVACOR) 20 MG tablet Take 1 tablet (20 mg total) by mouth daily at 6 PM. 30 tablet 10  . Magnesium 400 MG CAPS Take 400 mg by mouth daily.     . Omega-3  Fatty Acids (FISH OIL) 1200 MG CAPS Take 1,200 mg by mouth daily.     . vitamin B-12 (CYANOCOBALAMIN) 1000 MCG tablet Take 1,000 mcg by mouth daily.     No current facility-administered medications for this visit.     Allergies  Allergen Reactions  . Penicillins Hives    Has patient had a PCN reaction causing immediate rash, facial/tongue/throat swelling, SOB or lightheadedness with hypotension: unknown Has patient had a PCN reaction causing severe rash involving mucus membranes or skin necrosis: unknown Has patient had a PCN reaction that required hospitalization: no Has patient had a PCN reaction occurring within the last 10 years: no If all of the above answers are "NO", then may proceed with Cephalosporin use.   . Prednisone Other (See Comments)    DIZZINESS and swelling    Social History   Social History  . Marital status: Married    Spouse name: N/A  . Number of children: N/A  . Years of education: N/A   Occupational History  . Not on file.   Social History Main Topics  . Smoking status: Never Smoker  . Smokeless tobacco: Never Used  . Alcohol use 0.0 oz/week     Comment: RARELY  . Drug use: No  . Sexual activity: Yes    Birth control/ protection: Post-menopausal     Comment: 1st intercourse 89 yo-1 partner   Other Topics Concern  . Not on file   Social History Narrative  .  No narrative on file     Review of Systems: General: negative for chills, fever, night sweats or weight changes.  Cardiovascular: negative for chest pain, dyspnea on exertion, edema, orthopnea, palpitations, paroxysmal nocturnal dyspnea or shortness of breath Dermatological: negative for rash Respiratory: negative for cough or wheezing Urologic: negative for hematuria Abdominal: negative for nausea, vomiting, diarrhea, bright red blood per rectum, melena, or hematemesis Neurologic: negative for visual changes, syncope, or dizziness All other systems reviewed and are otherwise negative  except as noted above.    Blood pressure (!) 152/74, pulse 86, height 5' 6.5" (1.689 m), weight 162 lb (73.5 kg), SpO2 97 %.  General appearance: alert and no distress Neck: no adenopathy, no carotid bruit, no JVD, supple, symmetrical, trachea midline and thyroid not enlarged, symmetric, no tenderness/mass/nodules Lungs: clear to auscultation bilaterally Heart: regular rate and rhythm, S1, S2 normal, no murmur, click, rub or gallop Extremities: extremities normal, atraumatic, no cyanosis or edema  EKG not performed today  ASSESSMENT AND PLAN:   Essential hypertension History of hypertension with blood pressure measured at 152/74. She is on amlodipine and hydrochlorothiazide. Continue current meds at current dosing.  Hyperlipidemia History of hyperlipidemia on lovastatin followed by her PCP  Paroxysmal atrial fibrillation (HCC) History of paroxysmal atrial fibrillation. This patients CHA2DS2-VASc Score and unadjusted Ischemic Stroke Rate (% per year) is equal to 4.8 % stroke rate/year from a score of 4. I'm going to start her on novel oral anticoagulant.  Above score calculated as 1 point each if present [CHF, HTN, DM, Vascular=MI/PAD/Aortic Plaque, Age if 65-74, or Female] Above score calculated as 2 points each if present [Age > 75, or Stroke/TIA/TE]   Chest pain History of atypical chest pain with a recent negative pharmacologic Myoview stress test performed 06/06/16. Her chest pain has since resolved      Lorretta Harp MD [facility], [facility] 06/12/2016 12:29 PM

## 2016-06-24 ENCOUNTER — Telehealth: Payer: Self-pay | Admitting: Cardiovascular Disease

## 2016-06-24 NOTE — Telephone Encounter (Signed)
Patient states she is having dizzy spells and she thinks it due to Amlodipine.  appointment schedule for tomorrow with extender at 11 am. Patient has not  worn a monitor lately . Patient aware.

## 2016-06-24 NOTE — Telephone Encounter (Signed)
New message   Pt c/o medication issue:  1. Name of Medication: amLODipine (NORVASC) 5 MG tablet  2. How are you currently taking this medication (dosage and times per day)? 5MG , 1 time per day  3. Are you having a reaction (difficulty breathing--STAT)? Dizziness  4. What is your medication issue? Pt states that she is passing out and having dizziness with medication and does not want to take it anymore.

## 2016-06-25 ENCOUNTER — Encounter: Payer: Self-pay | Admitting: Nurse Practitioner

## 2016-06-25 ENCOUNTER — Ambulatory Visit (INDEPENDENT_AMBULATORY_CARE_PROVIDER_SITE_OTHER): Payer: Medicare Other | Admitting: Nurse Practitioner

## 2016-06-25 VITALS — BP 142/83 | HR 78 | Ht 66.5 in | Wt 161.4 lb

## 2016-06-25 DIAGNOSIS — R002 Palpitations: Secondary | ICD-10-CM | POA: Diagnosis not present

## 2016-06-25 DIAGNOSIS — I480 Paroxysmal atrial fibrillation: Secondary | ICD-10-CM | POA: Diagnosis not present

## 2016-06-25 DIAGNOSIS — I10 Essential (primary) hypertension: Secondary | ICD-10-CM | POA: Diagnosis not present

## 2016-06-25 DIAGNOSIS — R42 Dizziness and giddiness: Secondary | ICD-10-CM | POA: Diagnosis not present

## 2016-06-25 MED ORDER — AMLODIPINE BESYLATE 2.5 MG PO TABS: 2.5000 mg | ORAL_TABLET | Freq: Every day | ORAL | 3 refills | Status: DC

## 2016-06-25 NOTE — Progress Notes (Signed)
Office Visit    Patient Name: ANMARIE GHARIBIAN Date of Encounter: 06/25/2016  Primary Care Provider:  [place] Primary Cardiologist:  Adora Fridge, MD   Chief Complaint    76 year old female with history of paroxysmal atrial fibrillation, hypertension, and recent episode of chest pain with negative stress test, who presents for follow-up.  Past Medical History    Past Medical History:  Diagnosis Date  . Chest pain    a. 05/2016 Myoview: EF 74%, no ischemia/infarct.  . History of echocardiogram    a. 04/2016 Echo: EF 60-65%, no rwma.  . Hypercholesterolemia   . Hypertension   . Macular hole of left eye   . Mitral valve prolapse    no issues  . Osteopenia 2013   T score -1.4 stable from prior DEXA 2007 no FRAX calculated  . Paroxysmal atrial fibrillation (HCC)    a. CHA2DS2VASc = 4-->Xarelto.  . Pelvic relaxation    Mild and asymptomatic.  Marland Kitchen PONV (postoperative nausea and vomiting)    "little bit of nausea" after cataract surgery   Past Surgical History:  Procedure Laterality Date  . [place] VITRECTOMY WITH 20 GAUGE MVR PORT FOR MACULAR HOLE Left 11/22/2014   Procedure: 25 GAUGE PARS PLANA VITRECTOMY WITH 20 GAUGE MVR PORT FOR MACULAR HOLE LEFT EYE ;  Surgeon: Hayden Pedro, MD;  Location: [place];  Service: Ophthalmology;  Laterality: Left;  . CATARACT EXTRACTION, BILATERAL     Dr Herbert Deaner  . COLONOSCOPY    . DILATION AND CURETTAGE OF UTERUS     PT. DOES NOT REMEMBER THE YEAR  . DOPPLER ECHOCARDIOGRAPHY  05/13/2007  . EYE SURGERY Bilateral    cataract surgery with lens implant  . GAS/FLUID EXCHANGE Left 11/22/2014   Procedure: GAS/FLUID EXCHANGE;  Surgeon: Hayden Pedro, MD;  Location: [facility];  Service: Ophthalmology;  Laterality: Left;  [id number]  . LASER PHOTO ABLATION Left 11/22/2014   Procedure: LASER PHOTO ABLATION;  Surgeon: Hayden Pedro, MD;  Location: [place];  Service: Ophthalmology;  Laterality: Left;  Headscope laser  . MEMBRANE PEEL  Left 11/22/2014   Procedure: MEMBRANE PEEL;  Surgeon: Hayden Pedro, MD;  Location: [place];  Service: Ophthalmology;  Laterality: Left;  . myoview   06/05/2009  . SERUM PATCH Left 11/22/2014   Procedure: SERUM PATCH;  Surgeon: Hayden Pedro, MD;  Location: [place];  Service: Ophthalmology;  Laterality: Left;    Allergies  Allergies  Allergen Reactions  . Penicillins Hives    Has patient had a PCN reaction causing immediate rash, facial/tongue/throat swelling, SOB or lightheadedness with hypotension: unknown Has patient had a PCN reaction causing severe rash involving mucus membranes or skin necrosis: unknown Has patient had a PCN reaction that required hospitalization: no Has patient had a PCN reaction occurring within the last 10 years: no If all of the above answers are "NO", then may proceed with Cephalosporin use.   . Prednisone Other (See Comments)    DIZZINESS and swelling    History of Present Illness    76 year old female with a prior history of paroxysmal atrial fibrillation, hypertension, and hyperlipidemia who was recently hospitalized in late January in the setting of chest pain and hypertension. She underwent echocardiography which showed normal LV function. She subsequently underwent outpatient stress testing, which was negative for ischemia. In follow-up, she was placed on amlodipine therapy secondary to hypertension. She says that since her hospitalization, she has noted intermittent lightheadedness. This  may occur at rest or with activity. She describes it as the sudden sensation of feeling as though she may lose consciousness. This lasts just a few seconds and resolve spontaneously. It is sometimes associated with palpitations, which she believes to be atrial fibrillation. She is concerned that amlodipine may be causing her symptoms since they seem to be worse since starting it. She denies chest pain, dyspnea, PND, orthopnea, edema, or early satiety.  Home Medications      Prior to Admission medications   Medication Sig Start Date End Date Taking? Authorizing Provider  aspirin 81 MG tablet Take 81 mg by mouth daily.    Yes Historical Provider, MD  Calcium Carbonate-Vitamin D (CALCIUM + D PO) Take 1 tablet by mouth daily.    Yes Historical Provider, MD  Estradiol (VAGIFEM) 10 MCG TABS vaginal tablet Place 1 tablet (10 mcg total) vaginally 2 (two) times a week. 09/28/15  Yes Anastasio Auerbach, MD  hydrochlorothiazide (HYDRODIURIL) 25 MG tablet Take 25 mg by mouth daily.   Yes Historical Provider, MD  lovastatin (MEVACOR) 20 MG tablet Take 1 tablet (20 mg total) by mouth daily at 6 PM. 09/21/15  Yes Lorretta Harp, MD  Magnesium 400 MG CAPS Take 400 mg by mouth daily.    Yes Historical Provider, MD  Omega-3 Fatty Acids (FISH OIL) 1200 MG CAPS Take 1,200 mg by mouth daily.    Yes Historical Provider, MD  rivaroxaban (XARELTO) 20 MG TABS tablet Take 1 tablet (20 mg total) by mouth every evening. 06/12/16  Yes Lorretta Harp, MD  vitamin B-12 (CYANOCOBALAMIN) 1000 MCG tablet Take 1,000 mcg by mouth daily.   Yes Historical Provider, MD  amLODipine (NORVASC) 2.5 MG tablet Take 1 tablet (2.5 mg total) by mouth daily. 06/25/16 09/23/16  Rogelia Mire, NP    Review of Systems    As above, she has been experiencing intermittent lightheadedness. She also has occasional palpitations. She denies chest pain, dyspnea, PND, orthopnea, syncope, edema, or early satiety.  All other systems reviewed and are otherwise negative except as noted above.  Physical Exam    VS:  BP (!) 142/83   Pulse 78   Ht 5' 6.5" (1.689 m)   Wt 161 lb 6.4 oz (73.2 kg)   BMI 25.66 kg/m  , BMI Body mass index is 25.66 kg/m. Blood pressure lying 147/77, heart rate 68 Blood pressure sitting 145/80, heart rate 70 Blood pressure standing 139/76, heart rate 77 Blood pressure standing at 3 minutes 133/80, heart rate 73 GEN: Well nourished, well developed, in no acute distress.  HEENT: normal.   Neck: Supple, no JVD, carotid bruits, or masses. Cardiac: RRR, no murmurs, rubs, or gallops. No clubbing, cyanosis, edema.  Radials/DP/PT 2+ and equal bilaterally.  Respiratory:  Respirations regular and unlabored, clear to auscultation bilaterally. GI: Soft, nontender, nondistended, BS + x 4. MS: no deformity or atrophy. Skin: warm and dry, no rash. Neuro:  Strength and sensation are intact. Psych: Normal affect.  Accessory Clinical Findings    Most recent echo and stress testing reviewed and discussed with patient.  Assessment & Plan    1.  Lightheadedness: Since being placed on amlodipine, patient has had intermittent lightheadedness. This occurs while standing and also while at rest. She says that when it occurs, she is sort of in a fog and feels like she could lose consciousness. This impinges a last a few seconds and resolve spontaneously. Symptoms are sometimes associated with fluttering palpitations. She is very  mildly orthostatic on exam. I will reduce her amlodipine to 2.5 mg daily to see if this makes a difference. She has had palpitations associated with the symptoms, I will also place an event monitor to see if we can link and arrhythmia to her symptoms. I'm also scheduling a carotid ultrasound, as I don't believe that orthostasis explains all of this.  2. Essential hypertension: Blood pressure was elevated at rest, more normal with standing associated with mild lightheadedness. As above, and reducing her amlodipine simply to see if she feels any better at the lower dose.  3. History of chest pain: Status post recent negative Myoview. No recurrence.  4. Paroxysmal atrial fibrillation/palpitations: Occasional fluttering/vibratory type palpitations lasting just a few seconds. These are sometimes associated with lightheadedness. As above, and placing an event monitor. She remains on Xarelto therapy. Beta blocker was recently discontinued in the setting of bradycardia.  5.  Disposition: Follow-up in one month after event monitoring.   Murray Hodgkins, NP 06/25/2016, 12:20 PM

## 2016-06-25 NOTE — Patient Instructions (Addendum)
Medication Instructions:  1. DECREASE AMLODIPINE 2.5 MG DAILY; NEW RX HAS BEEN SENT IN    Labwork: NONE  Testing/Procedures: 1. Your physician has recommended that you wear an event monitor. Event monitors are medical devices that record the heart's electrical activity. Doctors most often [place] these monitors to diagnose arrhythmias. Arrhythmias are problems with the speed or rhythm of the heartbeat. The monitor is a small, portable device. You can wear one while you do your normal daily activities. This is usually used to diagnose what is causing palpitations/syncope (passing out).  2. Your physician has requested that you have a carotid duplex. This test is an ultrasound of the carotid arteries in your neck. It looks at blood flow through these arteries that supply the brain with blood. Allow one hour for this exam. There are no restrictions or special instructions.    Follow-Up: 1 MONTH WITH DR. Gwenlyn Found OR Kerin Ransom, PAC, OR RHONDA BARRETT, PAC; PER CHRIS BERG, NP  Any Other Special Instructions Will Be Listed Below (If Applicable).     If you need a refill on your cardiac medications before your next appointment, please call your pharmacy.

## 2016-07-02 ENCOUNTER — Ambulatory Visit (INDEPENDENT_AMBULATORY_CARE_PROVIDER_SITE_OTHER): Payer: Medicare Other

## 2016-07-02 ENCOUNTER — Other Ambulatory Visit: Payer: Self-pay | Admitting: Nurse Practitioner

## 2016-07-02 DIAGNOSIS — I480 Paroxysmal atrial fibrillation: Secondary | ICD-10-CM

## 2016-07-02 DIAGNOSIS — R002 Palpitations: Secondary | ICD-10-CM

## 2016-07-02 DIAGNOSIS — R42 Dizziness and giddiness: Secondary | ICD-10-CM | POA: Diagnosis not present

## 2016-07-09 ENCOUNTER — Inpatient Hospital Stay (HOSPITAL_COMMUNITY): Admission: RE | Admit: 2016-07-09 | Payer: Medicare Other | Source: Ambulatory Visit

## 2016-07-16 ENCOUNTER — Telehealth: Payer: Self-pay | Admitting: Cardiovascular Disease

## 2016-07-16 NOTE — Telephone Encounter (Signed)
Received a call from [employer] calling to report patient had a 10 beat run of VT rate 167.Monitor report reviewed by DOD Dr.Croitoru he advised to show Dr.Berry.Report was given to Dr.Berry's CMA Lovena Le.

## 2016-07-16 NOTE — Telephone Encounter (Signed)
New message     Critical EKG results

## 2016-07-17 ENCOUNTER — Ambulatory Visit (HOSPITAL_COMMUNITY)
Admission: RE | Admit: 2016-07-17 | Discharge: 2016-07-17 | Disposition: A | Discharge status: Home / Self Care | Payer: Medicare Other | Source: Ambulatory Visit | Attending: Cardiovascular Disease | Admitting: Cardiovascular Disease

## 2016-07-17 DIAGNOSIS — I6523 Occlusion and stenosis of bilateral carotid arteries: Secondary | ICD-10-CM | POA: Insufficient documentation

## 2016-07-17 DIAGNOSIS — R42 Dizziness and giddiness: Secondary | ICD-10-CM | POA: Insufficient documentation

## 2016-07-23 ENCOUNTER — Encounter: Payer: Self-pay | Admitting: Cardiovascular Disease

## 2016-07-23 ENCOUNTER — Ambulatory Visit (INDEPENDENT_AMBULATORY_CARE_PROVIDER_SITE_OTHER): Payer: Medicare Other | Admitting: Cardiovascular Disease

## 2016-07-23 VITALS — BP 146/80 | HR 66 | Ht 66.5 in | Wt 161.0 lb

## 2016-07-23 DIAGNOSIS — I480 Paroxysmal atrial fibrillation: Secondary | ICD-10-CM | POA: Diagnosis not present

## 2016-07-23 NOTE — Progress Notes (Signed)
Brittany Weaver returns for follow-up of her outpatient event monitor she is currently wearing. There is a personal history of PAF in the past although there is no documentation of this. A recent Myoview stress test was low risk and nonischemic and a 2-D echocardiogram was normal. Her monitor shows short bursts of wide-complex tachycardia. It is somewhat irregular and it may be PAF with the aberrancy versus nonsustained ventricular tachycardia. I'm going to refer her to her A. fib clinic to review her strips with our electrophysiologist and the hip determination about the exact arrhythmia demonstrated and whether or not to start an oral anticoagulant. I will see her back in 4 months.

## 2016-07-23 NOTE — Patient Instructions (Addendum)
Medication Instructions: Your physician recommends that you continue on your current medications as directed. Please refer to the Current Medication list given to you today.   Follow-Up: Stopping monitor today.  You have been referred to AFIB Clinic for determination of Anticoagulation therapy (Dx: PAF).  Your physician recommends that you schedule a follow-up appointment in: 4 months with Dr. Gwenlyn Found.  If you need a refill on your cardiac medications before your next appointment, please call your pharmacy.

## 2016-07-23 NOTE — Assessment & Plan Note (Signed)
Brittany Weaver returns for follow-up of her outpatient event monitor she is currently wearing. There is a personal history of PAF in the past although there is no documentation of this. A recent Myoview stress test was low risk and nonischemic and a 2-D echocardiogram was normal. Her monitor shows short bursts of wide-complex tachycardia. It is somewhat irregular and it may be PAF with the aberrancy versus nonsustained ventricular tachycardia. I'm going to refer her to her A. fib clinic to review her strips with our electrophysiologist and the hip determination about the exact arrhythmia demonstrated and whether or not to start an oral anticoagulant. I will see her back in 4 months.

## 2016-07-25 ENCOUNTER — Ambulatory Visit (HOSPITAL_COMMUNITY)
Admission: RE | Admit: 2016-07-25 | Discharge: 2016-07-25 | Disposition: A | Discharge status: Home / Self Care | Payer: Medicare Other | Source: Ambulatory Visit | Attending: Nurse Practitioner | Admitting: Nurse Practitioner

## 2016-07-25 ENCOUNTER — Encounter (HOSPITAL_COMMUNITY): Payer: Self-pay | Admitting: Nurse Practitioner

## 2016-07-25 VITALS — BP 126/74 | HR 81 | Wt 162.1 lb

## 2016-07-25 DIAGNOSIS — R001 Bradycardia, unspecified: Secondary | ICD-10-CM | POA: Diagnosis not present

## 2016-07-25 DIAGNOSIS — Z7901 Long term (current) use of anticoagulants: Secondary | ICD-10-CM | POA: Insufficient documentation

## 2016-07-25 DIAGNOSIS — E7800 Pure hypercholesterolemia, unspecified: Secondary | ICD-10-CM | POA: Diagnosis not present

## 2016-07-25 DIAGNOSIS — M8580 Other specified disorders of bone density and structure, unspecified site: Secondary | ICD-10-CM | POA: Diagnosis not present

## 2016-07-25 DIAGNOSIS — R002 Palpitations: Secondary | ICD-10-CM | POA: Diagnosis not present

## 2016-07-25 DIAGNOSIS — I4891 Unspecified atrial fibrillation: Secondary | ICD-10-CM | POA: Insufficient documentation

## 2016-07-25 DIAGNOSIS — I341 Nonrheumatic mitral (valve) prolapse: Secondary | ICD-10-CM | POA: Insufficient documentation

## 2016-07-25 DIAGNOSIS — Z841 Family history of disorders of kidney and ureter: Secondary | ICD-10-CM | POA: Insufficient documentation

## 2016-07-25 DIAGNOSIS — Z800 Family history of malignant neoplasm of digestive organs: Secondary | ICD-10-CM | POA: Insufficient documentation

## 2016-07-25 DIAGNOSIS — Z7982 Long term (current) use of aspirin: Secondary | ICD-10-CM | POA: Insufficient documentation

## 2016-07-25 DIAGNOSIS — Z880 Allergy status to penicillin: Secondary | ICD-10-CM | POA: Diagnosis not present

## 2016-07-25 DIAGNOSIS — Z823 Family history of stroke: Secondary | ICD-10-CM | POA: Insufficient documentation

## 2016-07-25 DIAGNOSIS — Z79899 Other long term (current) drug therapy: Secondary | ICD-10-CM | POA: Diagnosis not present

## 2016-07-25 DIAGNOSIS — Z8249 Family history of ischemic heart disease and other diseases of the circulatory system: Secondary | ICD-10-CM | POA: Diagnosis not present

## 2016-07-25 DIAGNOSIS — I480 Paroxysmal atrial fibrillation: Secondary | ICD-10-CM | POA: Diagnosis not present

## 2016-07-25 DIAGNOSIS — Z888 Allergy status to other drugs, medicaments and biological substances status: Secondary | ICD-10-CM | POA: Diagnosis not present

## 2016-07-25 DIAGNOSIS — Z9889 Other specified postprocedural states: Secondary | ICD-10-CM | POA: Diagnosis not present

## 2016-07-25 DIAGNOSIS — I10 Essential (primary) hypertension: Secondary | ICD-10-CM | POA: Diagnosis not present

## 2016-07-25 NOTE — Progress Notes (Addendum)
Primary Care Physician: [place] Referring Physician:Dr. Dezirea Mccollister is a 76 y.o. female with a h/o HTN, palpitations that is in the afib clinic for evaluation. Pt states that many years ago she c/o palpitations and was seen by Odette Fraction, MD in [place]. She wore a monitor and Dr. Mathis Bud saw afib and asked her to be on a blood thinner but she deferred. Palpitations become more notable recently and she was seen By Dr. Gwenlyn Found and wore an event monitor. She just took the monitor off and has to be mailed back in. One strip was sent to the afib clinic which shows afib vrs  vtach, nonsustained. She did become lightheaded with the rhythm for a few seconds. She describes her dizzy spells over the years to be just a few minutes possibly up to one minute, never sustained. Chadsvasc score is at least 4. She does not smoke, minimal alcohol, no significant caffeine, no snoring.  Today, she denies symptoms of palpitations, chest pain, shortness of breath, orthopnea, PND, lower extremity edema, dizziness, presyncope, syncope, or neurologic sequela. The patient is tolerating medications without difficulties and is otherwise without complaint today.   Past Medical History:  Diagnosis Date  . Chest pain    a. 05/2016 Myoview: EF 74%, no ischemia/infarct.  . History of echocardiogram    a. 04/2016 Echo: EF 60-65%, no rwma.  . Hypercholesterolemia   . Hypertension   . Macular hole of left eye   . Mitral valve prolapse    no issues  . Osteopenia 2013   T score -1.4 stable from prior DEXA 2007 no FRAX calculated  . Paroxysmal atrial fibrillation (HCC)    a. CHA2DS2VASc = 4-->Xarelto.  . Pelvic relaxation    Mild and asymptomatic.  Marland Kitchen PONV (postoperative nausea and vomiting)    "little bit of nausea" after cataract surgery   Past Surgical History:  Procedure Laterality Date  . [place] VITRECTOMY WITH 20 GAUGE MVR PORT FOR MACULAR HOLE Left 11/22/2014   Procedure: 25 GAUGE PARS PLANA VITRECTOMY WITH 20 GAUGE MVR PORT FOR MACULAR HOLE LEFT EYE ;  Surgeon: Hayden Pedro, MD;  Location: [facility];  Service: Ophthalmology;  Laterality: Left;  . CATARACT EXTRACTION, BILATERAL     Dr Herbert Deaner  . COLONOSCOPY    . DILATION AND CURETTAGE OF UTERUS     PT. DOES NOT REMEMBER THE YEAR  . DOPPLER ECHOCARDIOGRAPHY  05/13/2007  . EYE SURGERY Bilateral    cataract surgery with lens implant  . GAS/FLUID EXCHANGE Left 11/22/2014   Procedure: GAS/FLUID EXCHANGE;  Surgeon: Hayden Pedro, MD;  Location: [facility];  Service: Ophthalmology;  Laterality: Left;  [id number]  . LASER PHOTO ABLATION Left 11/22/2014   Procedure: LASER PHOTO ABLATION;  Surgeon: Hayden Pedro, MD;  Location: [facility];  Service: Ophthalmology;  Laterality: Left;  Headscope laser  . MEMBRANE PEEL Left 11/22/2014   Procedure: MEMBRANE PEEL;  Surgeon: Hayden Pedro, MD;  Location: [facility];  Service: Ophthalmology;  Laterality: Left;  . myoview   06/05/2009  . SERUM PATCH Left 11/22/2014   Procedure: SERUM PATCH;  Surgeon: Hayden Pedro, MD;  Location: [facility];  Service: Ophthalmology;  Laterality: Left;    Current Outpatient Prescriptions  Medication Sig Dispense Refill  . amLODipine (NORVASC) 2.5 MG tablet Take 1 tablet (2.5 mg total) by mouth daily. 90 tablet 3  . aspirin 81 MG tablet Take 81 mg by mouth daily.     Marland Kitchen  Calcium Carbonate-Vitamin D (CALCIUM + D PO) Take 1 tablet by mouth daily.     . Estradiol (VAGIFEM) 10 MCG TABS vaginal tablet Place 1 tablet (10 mcg total) vaginally 2 (two) times a week. 8 tablet 11  . hydrochlorothiazide (HYDRODIURIL) 25 MG tablet Take 25 mg by mouth daily.    Marland Kitchen lovastatin (MEVACOR) 20 MG tablet Take 1 tablet (20 mg total) by mouth daily at 6 PM. 30 tablet 10  . Magnesium 400 MG CAPS Take 400 mg by mouth daily.     . Omega-3 Fatty Acids (FISH OIL) 1200 MG CAPS Take 1,200 mg by mouth daily.     . vitamin B-12 (CYANOCOBALAMIN) 1000 MCG tablet Take 1,000 mcg by mouth daily.     . rivaroxaban (XARELTO) 20 MG TABS tablet Take 1 tablet (20 mg total) by mouth every evening. (Patient not taking: Reported on 07/25/2016) 30 tablet 6   No current facility-administered medications for this encounter.     Allergies  Allergen Reactions  . Penicillins Hives    Has patient had a PCN reaction causing immediate rash, facial/tongue/throat swelling, SOB or lightheadedness with hypotension: unknown Has patient had a PCN reaction causing severe rash involving mucus membranes or skin necrosis: unknown Has patient had a PCN reaction that required hospitalization: no Has patient had a PCN reaction occurring within the last 10 years: no If all of the above answers are "NO", then may proceed with Cephalosporin use.   . Prednisone Other (See Comments)    DIZZINESS and swelling    Social History   Social History  . Marital status: Married    Spouse name: N/A  . Number of children: N/A  . Years of education: N/A   Occupational History  . Not on file.   Social History Main Topics  . Smoking status: Never Smoker  . Smokeless tobacco: Never Used  . Alcohol use 0.0 oz/week     Comment: RARELY  . Drug use: No  . Sexual activity: Yes    Birth control/ protection: Post-menopausal     Comment: 1st intercourse 59 yo-1 partner   Other Topics Concern  . Not on file   Social History Narrative  . No narrative on file    Family History  Problem Relation Age of Onset  . Stroke Mother   . Heart disease Mother   . Kidney disease Mother   . Hypertension Mother   . Hyperlipidemia Mother   . Stroke Brother   . Heart disease Brother   . Stroke Brother   . Heart disease Brother   . Heart failure Sister   . Cancer Sister     Esophagus/stomach  . Stroke Sister   . Heart disease Sister   . Heart disease Father   . Colon cancer Neg Hx   . Rectal cancer Neg Hx     ROS- All systems are reviewed and negative except as per the HPI above  Physical Exam: Vitals:   07/25/16  1059  BP: 126/74  Pulse: 81  SpO2: 98%  Weight: 162 lb 2 oz (73.5 kg)   Wt Readings from Last 3 Encounters:  07/25/16 162 lb 2 oz (73.5 kg)  07/23/16 161 lb (73 kg)  06/25/16 161 lb 6.4 oz (73.2 kg)    Labs: Lab Results  Component Value Date   NA 136 05/24/2016   K 3.7 05/24/2016   CL 102 05/24/2016   CO2 24 05/24/2016   GLUCOSE 95 05/24/2016   BUN 19 05/24/2016  CREATININE 0.90 05/24/2016   CALCIUM 10.1 05/24/2016   No results found for: INR Lab Results  Component Value Date   CHOL 149 06/17/2014   HDL 60 06/17/2014   LDLCALC 67 06/17/2014   TRIG 111 06/17/2014     GEN- The patient is well appearing, alert and oriented x 3 today.   Head- normocephalic, atraumatic Eyes-  Sclera clear, conjunctiva pink Ears- hearing intact Oropharynx- clear Neck- supple, no JVP Lymph- no cervical lymphadenopathy Lungs- Clear to ausculation bilaterally, normal work of breathing Heart- Regular rate and rhythm, no murmurs, rubs or gallops, PMI not laterally displaced GI- soft, NT, ND, + BS Extremities- no clubbing, cyanosis, or edema MS- no significant deformity or atrophy Skin- no rash or lesion Psych- euthymic mood, full affect Neuro- strength and sensation are intact  EKG- NSR at 67 bpm, pr int 198 ms, qrs int 68 ms, qtc 437 ms One strip reviewed from event monitor, afib with aberrancy vrs vtach, nonsustained Echo-Study Conclusions  - Procedure narrative: Transthoracic echocardiography. Image   quality was poor. The study was technically difficult, as a   result of poor acoustic windows. - Left ventricle: The cavity size was normal. Wall thickness was   increased in a pattern of mild LVH. Systolic function was normal.   The estimated ejection fraction was in the range of 60% to 65%.   Wall motion was normal; there were no regional wall motion   abnormalities. Diastolic dysfunction, grade indeterminate. - Mitral valve: Mildly calcified annulus. - Inferior vena cava: The  vessel was dilated. The respirophasic   diameter changes were blunted (< 50%), consistent with elevated   central venous pressure. Estimated CVP 15 mmHg.  ------------------------------------------------------------------- Study data:  Comparison was made to the study of 05/13/2007.  Study status:  Routine.    Assessment and Plan: 1. Remote dx of afib by Odette Fraction, MD Based on this and chadsvasc score of 4, I believe tht pt should be on anticoagulant  She does have xarelto 20 mg a day but has not started  I am not convinced that her current rhythm strip reflects afib, will review with Dr. Rayann Heman I would also like to review the monitor results in whole as pt states that she had many dizzy spells while wearing and would like to see if all correlate with an arrhythmia. If arrhythmia is short bursts of vtach, she may need further EP f/u  Will review monitor when available and let pt know f/u.  Geroge Baseman Sterlin Knightly, [facility] [address] [place], [place] [address] [phone]    Addendum 4/11- I reviewed the monitor strip with Dr. Rayann Heman and he did not believe this to represent afib but a non sustained episode of wide complex tachycardia. Will await complete holter to review.

## 2016-08-07 NOTE — Addendum Note (Signed)
Encounter addended by: Sherran Needs, NP on: 08/07/2016  9:14 AM<BR>    Actions taken: Sign clinical note

## 2016-09-09 ENCOUNTER — Other Ambulatory Visit (HOSPITAL_COMMUNITY): Payer: Self-pay | Admitting: Family Medicine

## 2016-09-09 DIAGNOSIS — Z1231 Encounter for screening mammogram for malignant neoplasm of breast: Secondary | ICD-10-CM

## 2016-09-10 ENCOUNTER — Other Ambulatory Visit (HOSPITAL_COMMUNITY): Payer: Self-pay | Admitting: Family Medicine

## 2016-09-16 ENCOUNTER — Other Ambulatory Visit: Payer: Self-pay | Admitting: Family Medicine

## 2016-09-16 DIAGNOSIS — E2839 Other primary ovarian failure: Secondary | ICD-10-CM

## 2016-09-16 DIAGNOSIS — Z1231 Encounter for screening mammogram for malignant neoplasm of breast: Secondary | ICD-10-CM

## 2016-09-16 DIAGNOSIS — R5381 Other malaise: Secondary | ICD-10-CM

## 2016-09-23 ENCOUNTER — Other Ambulatory Visit: Payer: Self-pay | Admitting: Cardiovascular Disease

## 2016-09-30 ENCOUNTER — Encounter: Payer: Medicare Other | Admitting: Gynecology

## 2016-11-28 ENCOUNTER — Other Ambulatory Visit (HOSPITAL_COMMUNITY): Payer: Self-pay | Admitting: Family Medicine

## 2016-11-28 DIAGNOSIS — E2839 Other primary ovarian failure: Secondary | ICD-10-CM

## 2017-01-08 ENCOUNTER — Ambulatory Visit (HOSPITAL_COMMUNITY): Payer: Medicare Other

## 2017-01-08 ENCOUNTER — Other Ambulatory Visit (HOSPITAL_COMMUNITY): Payer: Medicare Other

## 2017-01-16 ENCOUNTER — Ambulatory Visit (HOSPITAL_COMMUNITY)
Admission: RE | Admit: 2017-01-16 | Discharge: 2017-01-16 | Disposition: A | Discharge status: Home / Self Care | Payer: Medicare Other | Source: Ambulatory Visit | Attending: Family Medicine | Admitting: Family Medicine

## 2017-01-16 DIAGNOSIS — Z1389 Encounter for screening for other disorder: Secondary | ICD-10-CM | POA: Diagnosis not present

## 2017-01-16 DIAGNOSIS — Z1231 Encounter for screening mammogram for malignant neoplasm of breast: Secondary | ICD-10-CM | POA: Diagnosis not present

## 2017-01-16 DIAGNOSIS — E2839 Other primary ovarian failure: Secondary | ICD-10-CM

## 2017-01-16 DIAGNOSIS — M8588 Other specified disorders of bone density and structure, other site: Secondary | ICD-10-CM | POA: Insufficient documentation

## 2017-02-11 IMAGING — DX DG CHEST 2V
2 series · 2 of 2 positions shown · non-contrast
Comparison: 07/05/2005

CLINICAL DATA: Chest tightness for 1 month

EXAM:
CHEST  2 VIEW

[chest pa]
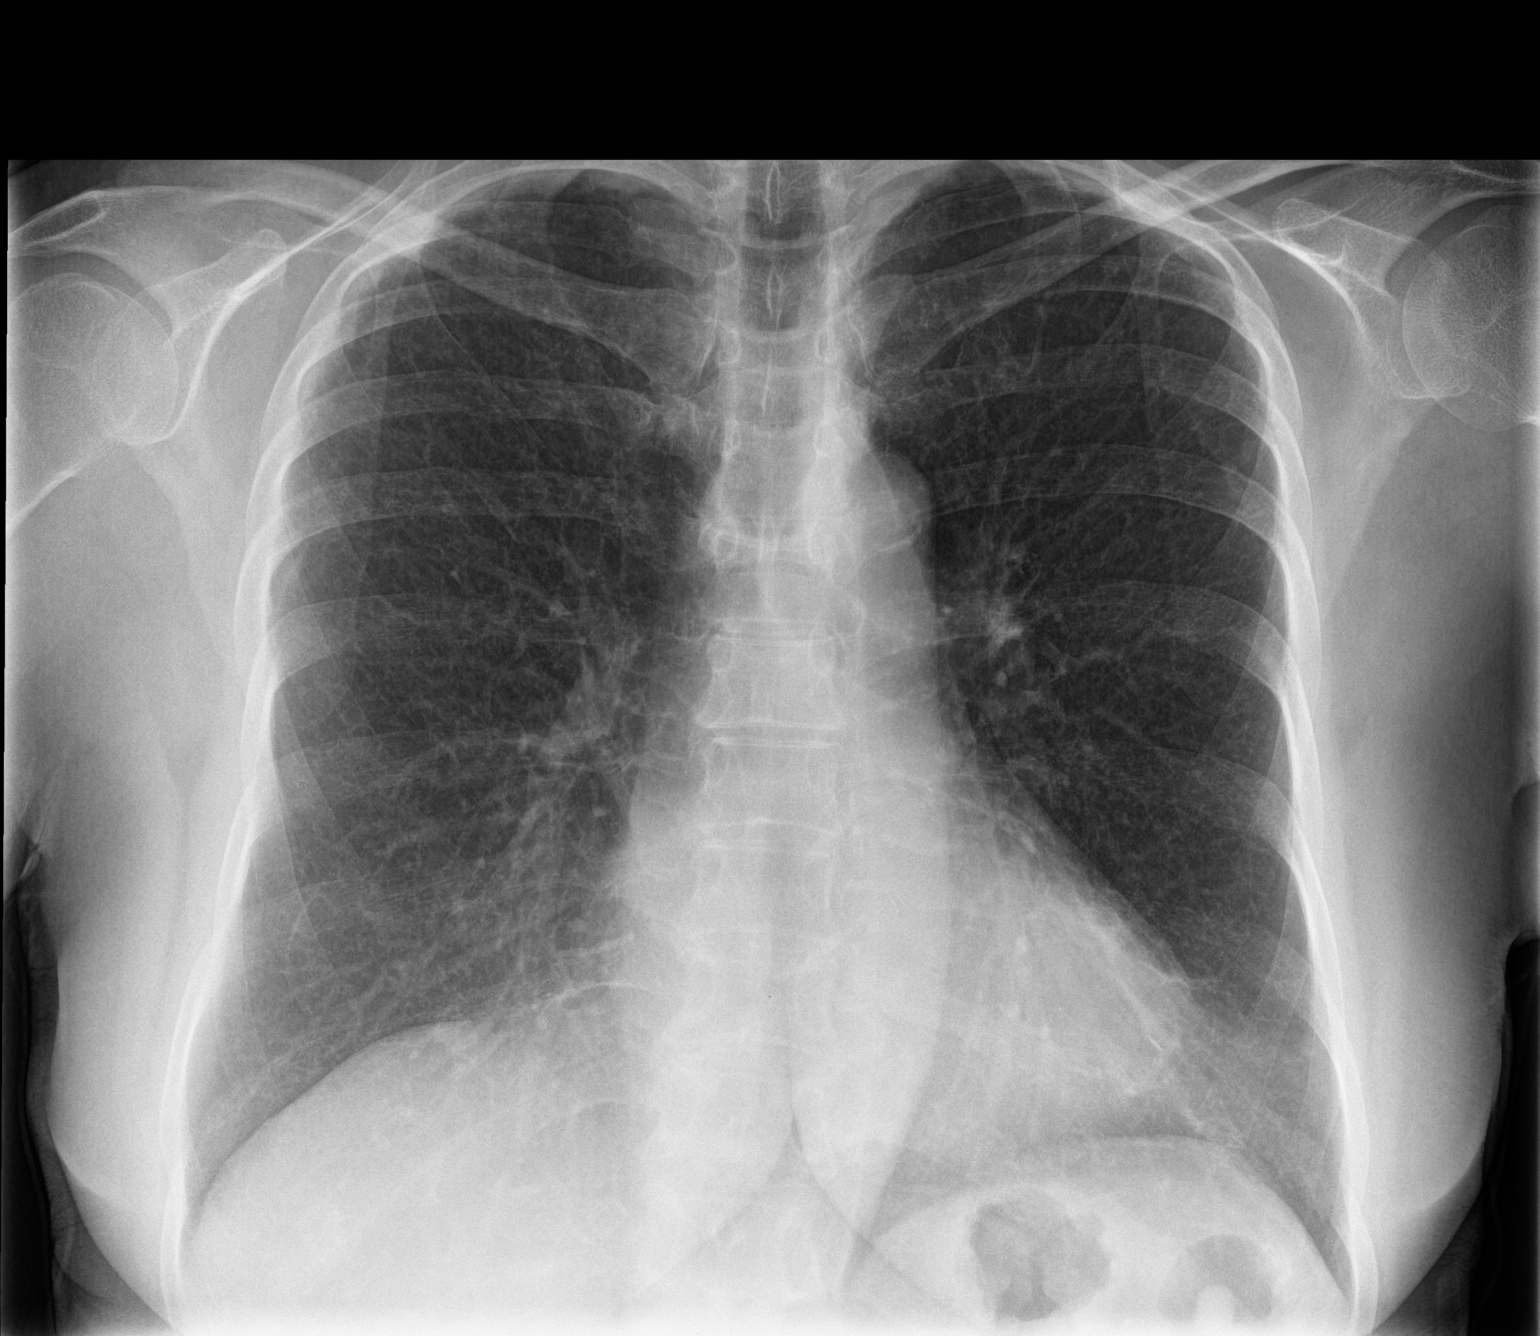

[chest lat]
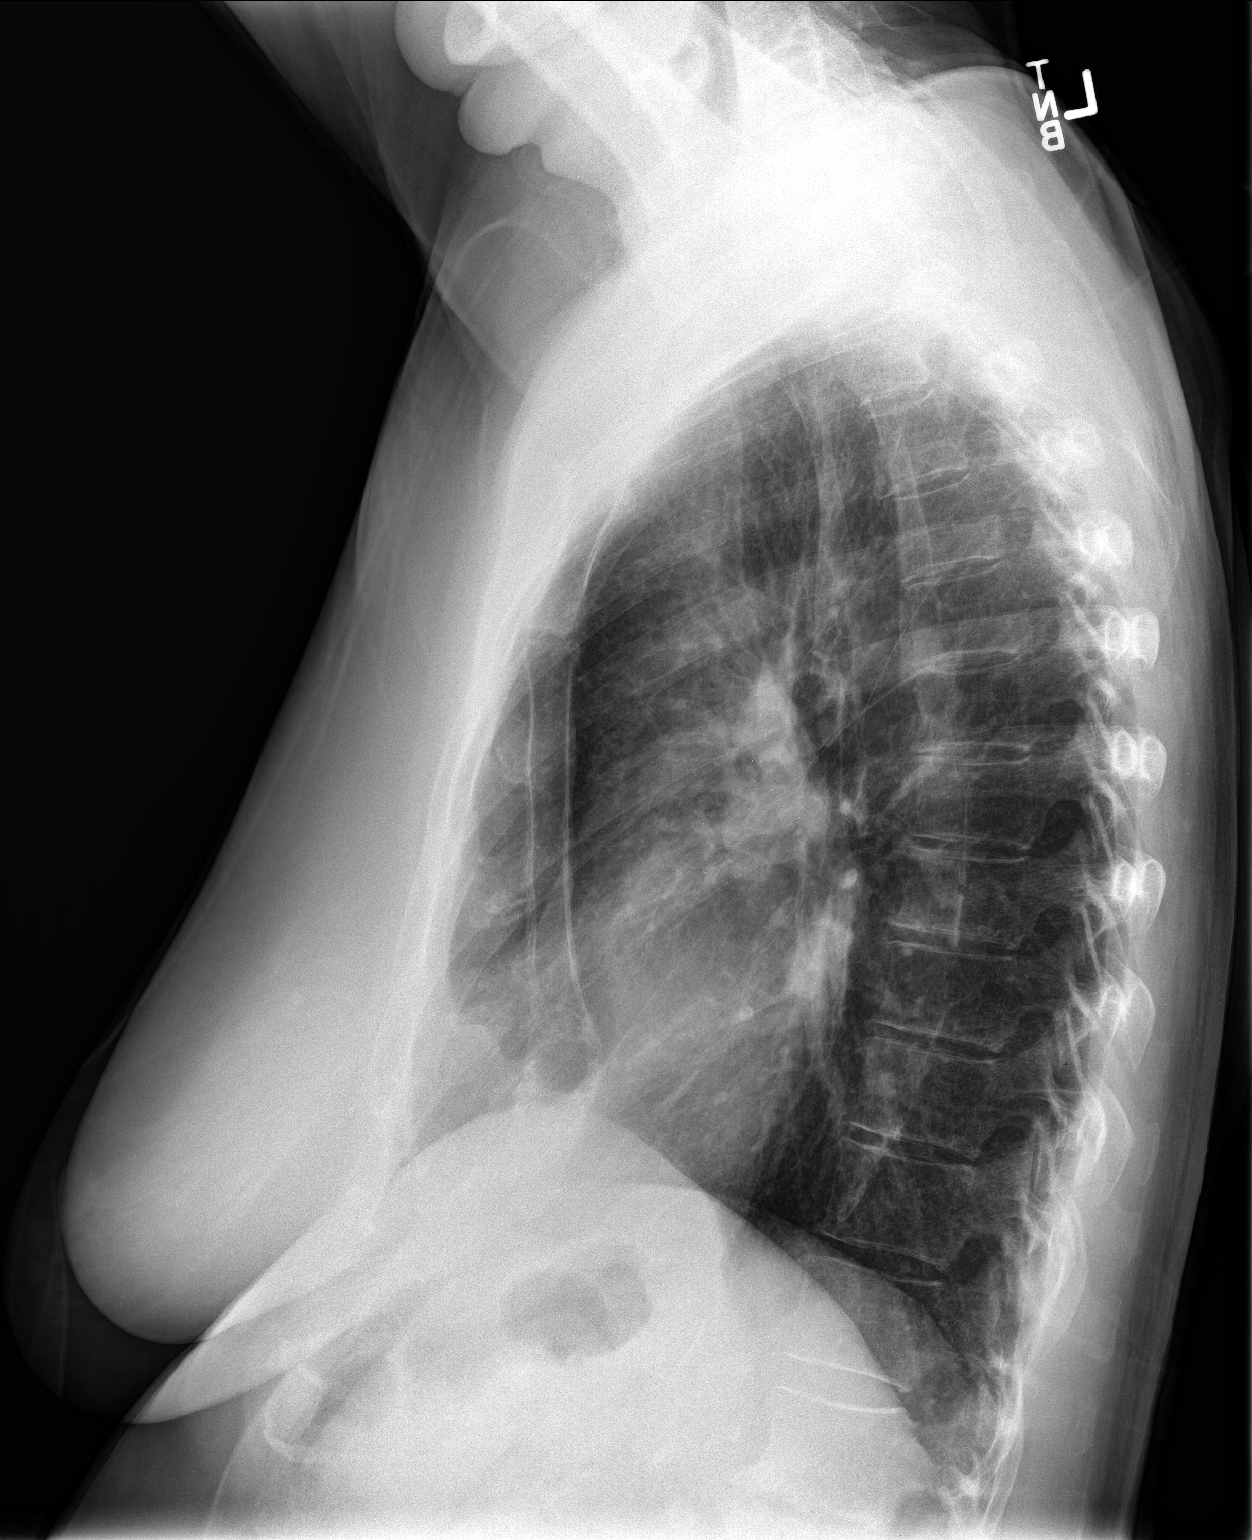

[2 of 2 positions shown; findings below may reference images not displayed]

FINDINGS: Upper normal heart size. Interstitial prominence is stay
pneumothorax or pleural effusion. No lung mass or consolidation.
IMPRESSION: Chronic changes.  No active cardiopulmonary disease.

## 2017-04-10 ENCOUNTER — Ambulatory Visit (INDEPENDENT_AMBULATORY_CARE_PROVIDER_SITE_OTHER): Payer: Medicare Other | Admitting: Ophthalmology

## 2017-04-10 DIAGNOSIS — I10 Essential (primary) hypertension: Secondary | ICD-10-CM | POA: Diagnosis not present

## 2017-04-10 DIAGNOSIS — H35342 Macular cyst, hole, or pseudohole, left eye: Secondary | ICD-10-CM

## 2017-04-10 DIAGNOSIS — H35033 Hypertensive retinopathy, bilateral: Secondary | ICD-10-CM

## 2017-04-10 DIAGNOSIS — D3132 Benign neoplasm of left choroid: Secondary | ICD-10-CM | POA: Diagnosis not present

## 2017-04-10 DIAGNOSIS — H43813 Vitreous degeneration, bilateral: Secondary | ICD-10-CM

## 2017-06-26 ENCOUNTER — Other Ambulatory Visit: Payer: Self-pay | Admitting: Nurse Practitioner

## 2017-06-30 NOTE — Telephone Encounter (Signed)
Rx(s) sent to pharmacy electronically.  

## 2017-07-23 ENCOUNTER — Encounter: Payer: Self-pay | Admitting: Cardiovascular Disease

## 2017-07-23 ENCOUNTER — Ambulatory Visit: Payer: Medicare Other | Admitting: Cardiovascular Disease

## 2017-07-23 VITALS — BP 132/72 | HR 74 | Ht 66.5 in | Wt 156.6 lb

## 2017-07-23 DIAGNOSIS — I10 Essential (primary) hypertension: Secondary | ICD-10-CM

## 2017-07-23 DIAGNOSIS — I480 Paroxysmal atrial fibrillation: Secondary | ICD-10-CM | POA: Diagnosis not present

## 2017-07-23 NOTE — Patient Instructions (Addendum)
Medication Instructions: Your physician recommends that you continue on your current medications as directed. Please refer to the Current Medication list given to you today.    Follow-Up:  Schedule follow-up appointment Next Wednesday with Dr. Nelle Don, CMA.   Your physician wants you to follow-up in: 1 year with Dr. Gwenlyn Found. You will receive a reminder letter in the mail two months in advance. If you don't receive a letter, please call our office to schedule the follow-up appointment.  If you need a refill on your cardiac medications before your next appointment, please call your pharmacy.

## 2017-07-23 NOTE — Assessment & Plan Note (Signed)
History of essential hypertension blood pressures measured at 132/72. She is on hydrochlorothiazide and amlodipine. Continue current meds at current dosing.

## 2017-07-23 NOTE — Progress Notes (Signed)
07/23/2017 Brittany Weaver   09-27-1940  [id number]  Primary Physician [facility] Primary Cardiologist: Brittany Harp MD Brittany Weaver, [place], [name]  HPI:  Brittany Weaver is a 77 y.o.  thin-appearing, married Caucasian female, mother of 3 who I last saw in the office 06/12/16.Marland Kitchen She has a strong family history of heart disease, as well as hypertension, hyperlipidemia and paroxysmal A-fib. She is a retired Loss adjuster, chart[employer]. Her last Myoview performed 3 years ago was nonischemic. She denies chest pain or shortness of breath. She does have occasional episodes of breakthrough A-fib with some dizziness but no presyncope. We will discuss oral anticoagulation which she preferred not to be on which I can't disagree with given her relative infrequency of breakthrough episodes. She was recently admitted to [facility] with chest pain and hypertension on 05/24/16 at discharge the next day. She ruled out for myocardial infarction. She was bradycardic and her metoprolol was discontinued. 2-D echo was normal. She is under a lot of stress at home raising 2 grandchildren. Her chest pain has since resolved since I saw her in the office 2 weeks ago. A pharmacologic Myoview stress test performed 06/06/16 was nonischemic.  Since I saw her a year ago she's done well. Recently she's had some episodes of presyncope. She feels like she is going to pass out but catches herself. She denies chest pain or shortness of breath.     Current Meds  Medication Sig  . amLODipine (NORVASC) 2.5 MG tablet Take 1 tablet (2.5 mg total) by mouth daily. PLEASE CONTACT OFFICE FOR ADDITIONAL REFILLS  . aspirin 81 MG tablet Take 81 mg by mouth daily.   . hydrochlorothiazide (HYDRODIURIL) 25 MG tablet Take 25 mg by mouth daily.  Marland Kitchen lovastatin (MEVACOR) 20 MG tablet TAKE ONE TABLET BY MOUTH ONCE DAILY AT  6  PM  . Magnesium 400 MG CAPS Take 400 mg by mouth daily.   . rivaroxaban (XARELTO) 20 MG TABS tablet  Take 1 tablet (20 mg total) by mouth every evening.  . vitamin B-12 (CYANOCOBALAMIN) 1000 MCG tablet Take 1,000 mcg by mouth daily.     Allergies  Allergen Reactions  . Penicillins Hives    Has patient had a PCN reaction causing immediate rash, facial/tongue/throat swelling, SOB or lightheadedness with hypotension: unknown Has patient had a PCN reaction causing severe rash involving mucus membranes or skin necrosis: unknown Has patient had a PCN reaction that required hospitalization: no Has patient had a PCN reaction occurring within the last 10 years: no If all of the above answers are "NO", then may proceed with Cephalosporin use.   . Prednisone Other (See Comments)    DIZZINESS and swelling    Social History   Socioeconomic History  . Marital status: Married    Spouse name: Not on file  . Number of children: Not on file  . Years of education: Not on file  . Highest education level: Not on file  Occupational History  . Not on file  Social Needs  . Financial resource strain: Not on file  . Food insecurity:    Worry: Not on file    Inability: Not on file  . Transportation needs:    Medical: Not on file    Non-medical: Not on file  Tobacco Use  . Smoking status: Never Smoker  . Smokeless tobacco: Never Used  Substance and Sexual Activity  . Alcohol use: Yes    Alcohol/week: 0.0 oz  Comment: RARELY  . Drug use: No  . Sexual activity: Yes    Birth control/protection: Post-menopausal    Comment: 1st intercourse 44 yo-1 partner  Lifestyle  . Physical activity:    Days per week: Not on file    Minutes per session: Not on file  . Stress: Not on file  Relationships  . Social connections:    Talks on phone: Not on file    Gets together: Not on file    Attends religious service: Not on file    Active member of club or organization: Not on file    Attends meetings of clubs or organizations: Not on file    Relationship status: Not on file  . Intimate partner  violence:    Fear of current or ex partner: Not on file    Emotionally abused: Not on file    Physically abused: Not on file    Forced sexual activity: Not on file  Other Topics Concern  . Not on file  Social History Narrative  . Not on file     Review of Systems: General: negative for chills, fever, night sweats or weight changes.  Cardiovascular: negative for chest pain, dyspnea on exertion, edema, orthopnea, palpitations, paroxysmal nocturnal dyspnea or shortness of breath Dermatological: negative for rash Respiratory: negative for cough or wheezing Urologic: negative for hematuria Abdominal: negative for nausea, vomiting, diarrhea, bright red blood per rectum, melena, or hematemesis Neurologic: negative for visual changes, syncope, or dizziness All other systems reviewed and are otherwise negative except as noted above.    Blood pressure 132/72, pulse 74, height 5' 6.5" (1.689 m), weight 156 lb 9.6 oz (71 kg).  General appearance: alert and no distress Neck: no adenopathy, no carotid bruit, no JVD, supple, symmetrical, trachea midline and thyroid not enlarged, symmetric, no tenderness/mass/nodules Lungs: clear to auscultation bilaterally Heart: regular rate and rhythm, S1, S2 normal, no murmur, click, rub or gallop Extremities: extremities normal, atraumatic, no cyanosis or edema Pulses: 2+ and symmetric Skin: Skin color, texture, turgor normal. No rashes or lesions Neurologic: Alert and oriented X 3, normal strength and tone. Normal symmetric reflexes. Normal coordination and gait  EKG sinus rhythm at 74 without ST or T-wave changes. I personally reviewed this EKG.  ASSESSMENT AND PLAN:   Essential hypertension History of essential hypertension blood pressures measured at 132/72. She is on hydrochlorothiazide and amlodipine. Continue current meds at current dosing.  Hyperlipidemia History of hyperlipidemia on statin therapy followed by her PCP  Paroxysmal atrial  fibrillation ([place]) Questionable history of PAF not reliably demonstrated on her recent event monitor. There is a question of whether she had this in 2007 although we do not have strips or records. At this point, I'm hesitant to begin her on an oral anticoagulant.  Chest pain History of atypical chest pain in the past with a negative minus the stress test with no recurrence over the past year      Brittany Harp MD [facility], [facility] 07/23/2017 11:49 AM

## 2017-07-23 NOTE — Assessment & Plan Note (Signed)
Questionable history of PAF not reliably demonstrated on her recent event monitor. There is a question of whether she had this in 2007 although we do not have strips or records. At this point, I'm hesitant to begin her on an oral anticoagulant.

## 2017-07-23 NOTE — Assessment & Plan Note (Signed)
History of atypical chest pain in the past with a negative minus the stress test with no recurrence over the past year

## 2017-07-23 NOTE — Assessment & Plan Note (Signed)
History of hyperlipidemia on statin therapy followed by her PCP. 

## 2017-07-29 ENCOUNTER — Other Ambulatory Visit: Payer: Self-pay | Admitting: Cardiovascular Disease

## 2017-12-29 ENCOUNTER — Other Ambulatory Visit: Payer: Self-pay | Admitting: Cardiovascular Disease

## 2017-12-30 NOTE — Telephone Encounter (Signed)
Rx sent to pharmacy   

## 2018-04-13 ENCOUNTER — Encounter (INDEPENDENT_AMBULATORY_CARE_PROVIDER_SITE_OTHER): Payer: Medicare Other | Admitting: Ophthalmology

## 2018-04-13 DIAGNOSIS — H43813 Vitreous degeneration, bilateral: Secondary | ICD-10-CM

## 2018-04-13 DIAGNOSIS — H35033 Hypertensive retinopathy, bilateral: Secondary | ICD-10-CM | POA: Diagnosis not present

## 2018-04-13 DIAGNOSIS — D3132 Benign neoplasm of left choroid: Secondary | ICD-10-CM

## 2018-04-13 DIAGNOSIS — I10 Essential (primary) hypertension: Secondary | ICD-10-CM

## 2018-04-13 DIAGNOSIS — H35342 Macular cyst, hole, or pseudohole, left eye: Secondary | ICD-10-CM | POA: Diagnosis not present

## 2018-05-12 ENCOUNTER — Other Ambulatory Visit: Payer: Self-pay | Admitting: Gynecology

## 2018-06-03 ENCOUNTER — Other Ambulatory Visit (HOSPITAL_COMMUNITY): Payer: Self-pay | Admitting: Internal Medicine

## 2018-06-03 ENCOUNTER — Ambulatory Visit (HOSPITAL_COMMUNITY)
Admission: RE | Admit: 2018-06-03 | Discharge: 2018-06-03 | Disposition: A | Discharge status: Home / Self Care | Payer: Medicare Other | Source: Ambulatory Visit | Attending: Internal Medicine | Admitting: Internal Medicine

## 2018-06-03 DIAGNOSIS — M549 Dorsalgia, unspecified: Secondary | ICD-10-CM | POA: Insufficient documentation

## 2018-06-03 DIAGNOSIS — R079 Chest pain, unspecified: Secondary | ICD-10-CM

## 2018-06-04 ENCOUNTER — Other Ambulatory Visit: Payer: Self-pay | Admitting: Family Medicine

## 2018-06-04 DIAGNOSIS — Z1231 Encounter for screening mammogram for malignant neoplasm of breast: Secondary | ICD-10-CM

## 2018-06-05 ENCOUNTER — Other Ambulatory Visit: Payer: Self-pay | Admitting: Family Medicine

## 2018-06-05 DIAGNOSIS — N644 Mastodynia: Secondary | ICD-10-CM

## 2018-06-12 ENCOUNTER — Ambulatory Visit
Admission: RE | Admit: 2018-06-12 | Discharge: 2018-06-12 | Disposition: A | Discharge status: Home / Self Care | Payer: Medicare Other | Source: Ambulatory Visit | Attending: Family Medicine | Admitting: Family Medicine

## 2018-06-12 DIAGNOSIS — N644 Mastodynia: Secondary | ICD-10-CM

## 2018-06-18 ENCOUNTER — Encounter: Payer: Medicare Other | Admitting: Gynecology

## 2018-06-24 ENCOUNTER — Ambulatory Visit: Payer: Medicare Other | Admitting: Gynecology

## 2018-06-24 ENCOUNTER — Encounter: Payer: Self-pay | Admitting: Gynecology

## 2018-06-24 VITALS — BP 118/78 | Ht 66.0 in | Wt 161.0 lb

## 2018-06-24 DIAGNOSIS — Z01419 Encounter for gynecological examination (general) (routine) without abnormal findings: Secondary | ICD-10-CM

## 2018-06-24 DIAGNOSIS — N8189 Other female genital prolapse: Secondary | ICD-10-CM

## 2018-06-24 DIAGNOSIS — N9489 Other specified conditions associated with female genital organs and menstrual cycle: Secondary | ICD-10-CM

## 2018-06-24 DIAGNOSIS — N949 Unspecified condition associated with female genital organs and menstrual cycle: Secondary | ICD-10-CM

## 2018-06-24 DIAGNOSIS — N952 Postmenopausal atrophic vaginitis: Secondary | ICD-10-CM

## 2018-06-24 DIAGNOSIS — M8580 Other specified disorders of bone density and structure, unspecified site: Secondary | ICD-10-CM

## 2018-06-24 LAB — WET PREP FOR TRICH, YEAST, CLUE

## 2018-06-24 MED ORDER — METRONIDAZOLE 0.75 % VA GEL: 1.0000 | Freq: Every day | VAGINAL | 0 refills | Status: DC

## 2018-06-24 MED ORDER — ESTRADIOL 10 MCG VA TABS: 1.0000 | ORAL_TABLET | VAGINAL | 11 refills | Status: DC

## 2018-06-24 NOTE — Patient Instructions (Addendum)
Start back on the Vagifem vaginal tablets twice weekly.  Call if any issues or bleeding. Use the MetroGel vaginal antibiotic nightly x7 nights.  Follow-up if the vaginal discharge continues.  Follow-up in 1 year for annual exam, sooner as needed.

## 2018-06-24 NOTE — Progress Notes (Signed)
Brittany Weaver 16-Nov-1940 [id number]        78 y.o.  G3P3003 for annual gynecologic exam.  Also notes over the last several days a yellow discharge.  No irritation or itching.  A little bit of stinging with urination.  No frequency urgency low back pain fever or chills.  Had been on Vagifem but discontinued and notes vaginal dryness also bothersome.  Past medical history,surgical history, problem list, medications, allergies, family history and social history were all reviewed and documented as reviewed in the EPIC chart.  ROS:  Performed with pertinent positives and negatives included in the history, assessment and plan.   Additional significant findings : None   Exam: [name] assistant Vitals:   06/24/18 1549  BP: 118/78  Weight: 161 lb (73 kg)  Height: 5\' 6"  (1.676 m)   Body mass index is 25.99 kg/m.  General appearance:  Normal affect, orientation and appearance. Skin: Grossly normal HEENT: Without gross lesions.  No cervical or supraclavicular adenopathy. Thyroid normal.  Lungs:  Clear without wheezing, rales or rhonchi Cardiac: RR, without RMG Abdominal:  Soft, nontender, without masses, guarding, rebound, organomegaly or hernia Breasts:  Examined lying and sitting without masses, retractions, discharge or axillary adenopathy. Pelvic:  Ext, BUS, Vagina: With atrophic changes.  Scant discharge noted.  Mild cystocele/rectocele and uterine prolapse.  Cervix: With atrophic changes  Uterus: Anteverted, normal size, shape and contour, midline and mobile nontender   Adnexa: Without masses or tenderness    Anus and perineum: Normal   Rectovaginal: Normal sphincter tone without palpated masses or tenderness.    Assessment/Plan:  78 y.o. G64P3003 female for annual gynecologic exam.   1. Postmenopausal/atrophic genital changes.  Had been using Vagifem but discontinued.  Having unacceptable vaginal dryness and would like to restart.  We discussed the issues of vaginal estrogen  and absorption risks with systemic effects such as increased risk of thrombosis, endometrial stimulation and the breast cancer issue.  She is comfortable with starting and Vagifem 10 mcg twice weekly prescribed x1 year.  Will call if any bleeding or other issues. 2. Vaginal discharge.  Wet prep is negative.  Yellow discharge for several days.  No associated symptoms.  Will treat as a low-grade bacterial vaginosis with MetroGel nightly x5 to 7 days.  Follow-up if symptoms persist. 3. Pelvic relaxation.  Mild cystocele/rectocele/uterine prolapse.  Asymptomatic to the patient.  Stable on serial exams.  We will continue to monitor annually.  She will follow-up sooner if she develops any symptoms. 4. History of left axillary fullness.  Had work-up to include mammogram and ultrasound.  Saw the general surgeon and they plan on expectant management.  Exam today is normal without significant asymmetry between her axilla.  She will continue with self breast exams on a monthly basis.  Just had her mammogram last week which was normal. 5. Osteopenia.  DEXA 2018 T score -1.6.  FRAX 19% / 4%.  Discussed increased FRAX at hip exceeding the 3% recommended to treat.  Options for treatment discussed.  At this point patient's not interested but will plan to repeat her DEXA this year or next year. 6. Colonoscopy 2015.  Repeat at their recommended interval. 7. Pap smear 2016.  No Pap smear done today.  No history of significant abnormal Pap smears.  We both agree to stop screening per current screening guidelines. 8. Health maintenance.  No routine lab work done as patient does this elsewhere.  Follow-up 1 year, sooner as needed.  [name]  Corey Skains MD, 4:55 PM 06/24/2018

## 2018-06-26 LAB — URINALYSIS, COMPLETE W/RFL CULTURE
BILIRUBIN URINE: NEGATIVE
Bacteria, UA: NONE SEEN /HPF
GLUCOSE, UA: NEGATIVE
Hgb urine dipstick: NEGATIVE
Hyaline Cast: NONE SEEN /LPF
Ketones, ur: NEGATIVE
NITRITES URINE, INITIAL: NEGATIVE
SPECIFIC GRAVITY, URINE: 1.016 (ref 1.001–1.03)
pH: 6 (ref 5.0–8.0)

## 2018-06-26 LAB — URINE CULTURE
MICRO NUMBER:: 250608
SPECIMEN QUALITY: ADEQUATE

## 2018-06-26 LAB — CULTURE INDICATED

## 2018-07-17 ENCOUNTER — Telehealth: Payer: Self-pay | Admitting: *Deleted

## 2018-07-17 MED ORDER — METRONIDAZOLE 0.75 % VA GEL: 1.0000 | Freq: Every day | VAGINAL | 0 refills | Status: DC

## 2018-07-17 NOTE — Telephone Encounter (Signed)
Okay to refill? 

## 2018-07-17 NOTE — Telephone Encounter (Signed)
Patient was treated with metrogel 0.75 on OV 06/24/18, called today c/o same greenish discharge again. Asked if Rx could be sent to pharmacy? Slight odor, no itching. Please advise

## 2018-07-17 NOTE — Telephone Encounter (Signed)
Patient aware, Rx sent.  

## 2018-07-30 ENCOUNTER — Other Ambulatory Visit: Payer: Self-pay | Admitting: Cardiovascular Disease

## 2018-07-30 NOTE — Telephone Encounter (Signed)
Refilled amlodipine 2.5 mg

## 2018-12-03 ENCOUNTER — Other Ambulatory Visit: Payer: Self-pay

## 2018-12-03 ENCOUNTER — Telehealth: Payer: Self-pay

## 2018-12-03 NOTE — Telephone Encounter (Signed)
Patient called in voice mail stating she had a "sore place" on her right breast nipple. It swelled to the bottom half of her nippled. She said last night it "broke open".  She questions if she needs to see Dr. Loetta Rough for this. I asked [name] to call her and schedule visit. She has an appointment for in the morning.

## 2018-12-04 ENCOUNTER — Encounter: Payer: Self-pay | Admitting: Gynecology

## 2018-12-04 ENCOUNTER — Ambulatory Visit (INDEPENDENT_AMBULATORY_CARE_PROVIDER_SITE_OTHER): Payer: Medicare Other | Admitting: Gynecology

## 2018-12-04 VITALS — BP 138/84

## 2018-12-04 DIAGNOSIS — N61 Mastitis without abscess: Secondary | ICD-10-CM

## 2018-12-04 MED ORDER — DOXYCYCLINE HYCLATE 100 MG PO CAPS: 100.0000 mg | ORAL_CAPSULE | Freq: Two times a day (BID) | ORAL | 0 refills | Status: DC

## 2018-12-04 NOTE — Patient Instructions (Signed)
Started on the antibiotics twice daily for 7 days.  The breast center should call to arrange for the mammogram and ultrasound in several weeks.  If you do not hear from them call this office.

## 2018-12-04 NOTE — Progress Notes (Signed)
    Brittany Weaver Aug 15, 1940 [id number]        77 y.o.  [id number] presents complaining of right nipple drainage.  Notes over the past week or so soreness of the right nipple with swelling and then had that area rupture with drainage of whitish and bloody fluid.  Has been putting Neosporin on the area.  No palpable masses to self-exam.  Last mammogram 05/2018  Past medical history,surgical history, problem list, medications, allergies, family history and social history were all reviewed and documented in the EPIC chart.  Directed ROS with pertinent positives and negatives documented in the history of present illness/assessment and plan.  Exam: [name] assistant Vitals:   12/04/18 1126  BP: 138/84   General appearance:  Normal Both breasts examined lying and sitting.  Left without masses retractions discharge adenopathy.  Right nipple with slight swelling, erythema and scabbing area.  No surrounding palpable masses or tenderness in the areola her underlying breast tissue.  No other breast masses or axillary adenopathy  Assessment/Plan:  78 y.o. G3P3003 history and exam as above.  Sounds as if the patient had a small inflammatory boil or small cyst that ruptured.  Recommend covering her with antibiotics for now with doxycycline 100 mg twice daily x7 days.  Assuming this area continues to heal and resolve will follow-up in several weeks with diagnostic mammography and ultrasound in the areolar area for underlying pathology.  Assuming studies are negative and she completely heals then will follow.    Anastasio Auerbach MD, 11:50 AM 12/04/2018

## 2018-12-07 ENCOUNTER — Telehealth: Payer: Self-pay | Admitting: *Deleted

## 2018-12-07 DIAGNOSIS — N61 Mastitis without abscess: Secondary | ICD-10-CM

## 2018-12-07 NOTE — Telephone Encounter (Signed)
Patient is already scheduled on 12/24/18 @ 9:40am at breast center.

## 2018-12-07 NOTE — Telephone Encounter (Signed)
-----   Message from Anastasio Auerbach, MD sent at 12/04/2018 11:53 AM EDT ----- Arrange for diagnostic mammogram and ultrasound right breast in several weeks reference nipple tenderness with ultimate drainage of milky bloody fluid.  No palpable abnormalities on physician exam.  Started on antibiotics for 1 week.  Request follow-up studies several weeks from now

## 2018-12-24 ENCOUNTER — Ambulatory Visit
Admission: RE | Admit: 2018-12-24 | Discharge: 2018-12-24 | Disposition: A | Discharge status: Home / Self Care | Payer: Medicare Other | Source: Ambulatory Visit | Attending: Gynecology | Admitting: Gynecology

## 2018-12-24 ENCOUNTER — Other Ambulatory Visit: Payer: Self-pay

## 2018-12-24 DIAGNOSIS — N61 Mastitis without abscess: Secondary | ICD-10-CM

## 2019-01-21 ENCOUNTER — Other Ambulatory Visit: Payer: Self-pay

## 2019-01-22 ENCOUNTER — Encounter: Payer: Self-pay | Admitting: Gynecology

## 2019-01-22 ENCOUNTER — Ambulatory Visit (INDEPENDENT_AMBULATORY_CARE_PROVIDER_SITE_OTHER): Payer: Medicare Other | Admitting: Gynecology

## 2019-01-22 VITALS — BP 122/80

## 2019-01-22 DIAGNOSIS — N61 Mastitis without abscess: Secondary | ICD-10-CM | POA: Diagnosis not present

## 2019-01-22 NOTE — Patient Instructions (Signed)
Follow-up if any recurrence of the nipple inflammation.

## 2019-01-22 NOTE — Progress Notes (Signed)
    Brittany Weaver [birth year] [id number]        78 y.o.  G3P3003 presents in follow-up of nipple inflammation starting last month.  Had drainage of a swollen area around the nipple.  It was felt to be small inflammatory boil or cyst that ruptured.  Was treated with antibiotics.  Had follow-up ultrasound and mammogram which were negative.  Was asked to follow-up regardless in several weeks for reexamination and consider biopsy if any skin changes remain rule out Paget's disease.  The patient notes that all of her symptoms have resolved with no residual inflammation/rash.  Past medical history,surgical history, problem list, medications, allergies, family history and social history were all reviewed and documented in the EPIC chart.  Directed ROS with pertinent positives and negatives documented in the history of present illness/assessment and plan.  Exam: [name] assistant Vitals:   01/22/19 1024  BP: 122/80   General appearance:  Normal Both breasts were examined lying and sitting.  No masses, retractions, discharge, adenopathy.  The right nipple was normal on exam with no residual rash or skin changes.  Assessment/Plan:  78 y.o. G3P3003 with complete resolution of the right nipple inflammation.  She will continue with self breast exams and as long as no recurrence of her symptoms or any findings on physical exam then will follow expectantly.  The patient is comfortable with this approach.    Anastasio Auerbach MD, 10:36 AM 01/22/2019

## 2019-01-29 ENCOUNTER — Other Ambulatory Visit: Payer: Self-pay

## 2019-01-29 ENCOUNTER — Encounter: Payer: Self-pay | Admitting: Cardiovascular Disease

## 2019-01-29 ENCOUNTER — Ambulatory Visit: Payer: Medicare Other | Admitting: Cardiovascular Disease

## 2019-01-29 DIAGNOSIS — I480 Paroxysmal atrial fibrillation: Secondary | ICD-10-CM

## 2019-01-29 DIAGNOSIS — R42 Dizziness and giddiness: Secondary | ICD-10-CM | POA: Diagnosis not present

## 2019-01-29 DIAGNOSIS — E782 Mixed hyperlipidemia: Secondary | ICD-10-CM

## 2019-01-29 DIAGNOSIS — I10 Essential (primary) hypertension: Secondary | ICD-10-CM

## 2019-01-29 NOTE — Patient Instructions (Signed)

## 2019-01-29 NOTE — Assessment & Plan Note (Signed)
History of hyperlipidemia on statin therapy with lipid profile performed 01/15/2018 revealing total cholesterol 149, LDL 64 and HDL 51.

## 2019-01-29 NOTE — Assessment & Plan Note (Signed)
She has had 4-5 episodes of dizziness over the last year and a half lasting seconds at a time without loss of consciousness.  We have investigated this with 2D echocardiography, carotid Doppler studies all which were normal.  She did have an event monitor performed 07/02/2016 revealing occasional short runs of PSVT and nonsustained VT.

## 2019-01-29 NOTE — Assessment & Plan Note (Signed)
History of essential hypertension with blood pressure measured today at 157/83.  This is higher than her usual blood pressure measurements at home with systolics in the Q000111Q.  She is on amlodipine, and hydrochlorothiazide.

## 2019-01-29 NOTE — Assessment & Plan Note (Signed)
History of paroxysmal atrial fibrillation very infrequent episodes maintaining sinus rhythm not on oral anticoagulation.

## 2019-01-29 NOTE — Progress Notes (Signed)
01/29/2019 Brittany Weaver   [birth year]  [id number]  Primary Physician Sharilyn Sites, MD Primary Cardiologist: Lorretta Harp MD [name], [place], [name]  HPI:  Brittany Weaver is a 78 y.o.  thin-appearing, married Caucasian female, mother of 3 who I last saw in the 07/23/2017.Marland Kitchen She has a strong family history of heart disease, as well as hypertension, hyperlipidemia and paroxysmal A-fib. She is a retired Loss adjuster, chartered. Her last Myoview performed 3 years ago was nonischemic. She denies chest pain or shortness of breath. She does have occasional episodes of breakthrough A-fib with some dizziness but no presyncope. We will discuss oral anticoagulation which she preferred not to be on which I can't disagree with given her relative infrequency of breakthrough episodes. She was recently admitted to [facility] with chest pain and hypertension on 05/24/16 at discharge the next day. She ruled out for myocardial infarction. She was bradycardic and her metoprolol was discontinued. 2-D echo was normal. She is under a lot of stress at home raising 2 grandchildren.Her chest pain has since resolved since I saw her in the office 2 weeks ago. A pharmacologic Myoview stress test performed 06/06/16 was nonischemic.  Since I saw her a year and a half ago, she still has occasional episodes of dizziness lasting seconds at a time not resulting in loss of consciousness.  These have become much less frequent and severe since I saw her last.  We were not able to determine an etiology despite doing outpatient testing including event monitoring.  She denies chest pain or shortness of breath.   Current Meds  Medication Sig  . amLODipine (NORVASC) 2.5 MG tablet Take 1 tablet by mouth once daily  . aspirin 81 MG tablet Take 81 mg by mouth daily.   Marland Kitchen BIOTIN PO Take by mouth.  . Calcium Carb-Cholecalciferol (CALCIUM 1000 + D PO) Take by mouth.  . calcium-vitamin D (OSCAL WITH D) 500-200 MG-UNIT tablet Take  1 tablet by mouth.  . hydrochlorothiazide (HYDRODIURIL) 25 MG tablet Take 25 mg by mouth daily.  Marland Kitchen lovastatin (MEVACOR) 20 MG tablet TAKE 1 TABLET BY MOUTH ONCE DAILY AT 6 PM  . Magnesium 400 MG CAPS Take 400 mg by mouth daily.   . vitamin B-12 (CYANOCOBALAMIN) 1000 MCG tablet Take 1,000 mcg by mouth daily.  . vitamin E 100 UNIT capsule Take by mouth daily.  . vitamin E 400 UNIT capsule Take 400 Units by mouth daily.     Allergies  Allergen Reactions  . Penicillins Hives    Has patient had a PCN reaction causing immediate rash, facial/tongue/throat swelling, SOB or lightheadedness with hypotension: unknown Has patient had a PCN reaction causing severe rash involving mucus membranes or skin necrosis: unknown Has patient had a PCN reaction that required hospitalization: no Has patient had a PCN reaction occurring within the last 10 years: no If all of the above answers are "NO", then may proceed with Cephalosporin use.   . Prednisone Other (See Comments)    DIZZINESS and swelling    Social History   Socioeconomic History  . Marital status: Married    Spouse name: Not on file  . Number of children: Not on file  . Years of education: Not on file  . Highest education level: Not on file  Occupational History  . Not on file  Social Needs  . Financial resource strain: Not on file  . Food insecurity    Worry: Not on file  Inability: Not on file  . Transportation needs    Medical: Not on file    Non-medical: Not on file  Tobacco Use  . Smoking status: Never Smoker  . Smokeless tobacco: Never Used  Substance and Sexual Activity  . Alcohol use: Yes    Alcohol/week: 0.0 standard drinks    Comment: RARELY  . Drug use: No  . Sexual activity: Yes    Birth control/protection: Post-menopausal    Comment: 1st intercourse 55 yo-1 partner  Lifestyle  . Physical activity    Days per week: Not on file    Minutes per session: Not on file  . Stress: Not on file  Relationships  .  Social Herbalist on phone: Not on file    Gets together: Not on file    Attends religious service: Not on file    Active member of club or organization: Not on file    Attends meetings of clubs or organizations: Not on file    Relationship status: Not on file  . Intimate partner violence    Fear of current or ex partner: Not on file    Emotionally abused: Not on file    Physically abused: Not on file    Forced sexual activity: Not on file  Other Topics Concern  . Not on file  Social History Narrative  . Not on file     Review of Systems: General: negative for chills, fever, night sweats or weight changes.  Cardiovascular: negative for chest pain, dyspnea on exertion, edema, orthopnea, palpitations, paroxysmal nocturnal dyspnea or shortness of breath Dermatological: negative for rash Respiratory: negative for cough or wheezing Urologic: negative for hematuria Abdominal: negative for nausea, vomiting, diarrhea, bright red blood per rectum, melena, or hematemesis Neurologic: negative for visual changes, syncope, or dizziness All other systems reviewed and are otherwise negative except as noted above.    Blood pressure (!) 157/83, pulse 71, temperature (!) 97.3 F (36.3 C), height 5\' 6"  (1.676 m), weight 165 lb 12.8 oz (75.2 kg), SpO2 98 %.  General appearance: alert and no distress Neck: no adenopathy, no carotid bruit, no JVD, supple, symmetrical, trachea midline and thyroid not enlarged, symmetric, no tenderness/mass/nodules Lungs: clear to auscultation bilaterally Heart: regular rate and rhythm, S1, S2 normal, no murmur, click, rub or gallop Extremities: extremities normal, atraumatic, no cyanosis or edema Pulses: 2+ and symmetric Skin: Skin color, texture, turgor normal. No rashes or lesions Neurologic: Alert and oriented X 3, normal strength and tone. Normal symmetric reflexes. Normal coordination and gait  EKG normal sinus rhythm at 66 without ST or T wave  changes.  I personally reviewed this EKG.  ASSESSMENT AND PLAN:   Essential hypertension History of essential hypertension with blood pressure measured today at 157/83.  This is higher than her usual blood pressure measurements at home with systolics in the Q000111Q.  She is on amlodipine, and hydrochlorothiazide.  Hyperlipidemia History of hyperlipidemia on statin therapy with lipid profile performed 01/15/2018 revealing total cholesterol 149, LDL 64 and HDL 51.  Paroxysmal atrial fibrillation (HCC) History of paroxysmal atrial fibrillation very infrequent episodes maintaining sinus rhythm not on oral anticoagulation.  Dizziness She has had 4-5 episodes of dizziness over the last year and a half lasting seconds at a time without loss of consciousness.  We have investigated this with 2D echocardiography, carotid Doppler studies all which were normal.  She did have an event monitor performed 07/02/2016 revealing occasional short runs of PSVT and nonsustained VT.  Lorretta Harp MD FACP,FACC,FAHA, [facility] 01/29/2019 8:20 AM

## 2019-02-04 ENCOUNTER — Encounter: Payer: Self-pay | Admitting: Gynecology

## 2019-02-07 ENCOUNTER — Other Ambulatory Visit: Payer: Self-pay | Admitting: Cardiovascular Disease

## 2019-02-15 ENCOUNTER — Other Ambulatory Visit (HOSPITAL_COMMUNITY): Payer: Self-pay | Admitting: Internal Medicine

## 2019-02-15 DIAGNOSIS — E2839 Other primary ovarian failure: Secondary | ICD-10-CM

## 2019-02-22 ENCOUNTER — Other Ambulatory Visit: Payer: Self-pay

## 2019-02-22 ENCOUNTER — Ambulatory Visit (HOSPITAL_COMMUNITY)
Admission: RE | Admit: 2019-02-22 | Discharge: 2019-02-22 | Disposition: A | Discharge status: Home / Self Care | Payer: Medicare Other | Source: Ambulatory Visit | Attending: Internal Medicine | Admitting: Internal Medicine

## 2019-02-22 DIAGNOSIS — E2839 Other primary ovarian failure: Secondary | ICD-10-CM | POA: Insufficient documentation

## 2019-03-16 ENCOUNTER — Other Ambulatory Visit: Payer: Self-pay | Admitting: *Deleted

## 2019-03-16 DIAGNOSIS — Z20822 Contact with and (suspected) exposure to covid-19: Secondary | ICD-10-CM

## 2019-03-18 LAB — NOVEL CORONAVIRUS, NAA: SARS-CoV-2, NAA: NOT DETECTED

## 2019-03-19 ENCOUNTER — Telehealth: Payer: Self-pay | Admitting: Family Medicine

## 2019-03-19 NOTE — Telephone Encounter (Signed)
° °  Pt given neg COVID  Results

## 2019-03-30 ENCOUNTER — Other Ambulatory Visit: Payer: Self-pay

## 2019-03-30 DIAGNOSIS — Z20822 Contact with and (suspected) exposure to covid-19: Secondary | ICD-10-CM

## 2019-04-01 LAB — NOVEL CORONAVIRUS, NAA: SARS-CoV-2, NAA: NOT DETECTED

## 2019-04-05 ENCOUNTER — Encounter (INDEPENDENT_AMBULATORY_CARE_PROVIDER_SITE_OTHER): Payer: Medicare Other | Admitting: Ophthalmology

## 2019-04-05 ENCOUNTER — Other Ambulatory Visit: Payer: Self-pay

## 2019-04-05 DIAGNOSIS — H35342 Macular cyst, hole, or pseudohole, left eye: Secondary | ICD-10-CM

## 2019-04-05 DIAGNOSIS — D3132 Benign neoplasm of left choroid: Secondary | ICD-10-CM | POA: Diagnosis not present

## 2019-04-05 DIAGNOSIS — H35033 Hypertensive retinopathy, bilateral: Secondary | ICD-10-CM

## 2019-04-05 DIAGNOSIS — I10 Essential (primary) hypertension: Secondary | ICD-10-CM | POA: Diagnosis not present

## 2019-04-05 DIAGNOSIS — H43813 Vitreous degeneration, bilateral: Secondary | ICD-10-CM

## 2019-04-29 ENCOUNTER — Other Ambulatory Visit: Payer: Self-pay

## 2019-04-29 MED ORDER — LOVASTATIN 20 MG PO TABS: ORAL_TABLET | ORAL | 1 refills | Status: DC

## 2019-05-10 ENCOUNTER — Other Ambulatory Visit: Payer: Self-pay

## 2019-05-10 ENCOUNTER — Ambulatory Visit: Payer: Medicare Other | Attending: Internal Medicine

## 2019-05-10 DIAGNOSIS — Z20822 Contact with and (suspected) exposure to covid-19: Secondary | ICD-10-CM | POA: Diagnosis not present

## 2019-05-11 DIAGNOSIS — Z03818 Encounter for observation for suspected exposure to other biological agents ruled out: Secondary | ICD-10-CM | POA: Diagnosis not present

## 2019-05-11 DIAGNOSIS — J0100 Acute maxillary sinusitis, unspecified: Secondary | ICD-10-CM | POA: Diagnosis not present

## 2019-05-11 LAB — NOVEL CORONAVIRUS, NAA: SARS-CoV-2, NAA: NOT DETECTED

## 2019-05-19 ENCOUNTER — Ambulatory Visit: Payer: Medicare Other | Attending: Internal Medicine

## 2019-05-19 DIAGNOSIS — Z23 Encounter for immunization: Secondary | ICD-10-CM | POA: Insufficient documentation

## 2019-05-19 NOTE — Progress Notes (Signed)
   Covid-19 Vaccination Clinic  Name:  Brittany Weaver    MRN: [id number] DOB: 03-Jul-1940  05/19/2019  [name] was observed post Covid-19 immunization for 30 minutes based on pre-vaccination screening without incidence. She was provided with Vaccine Information Sheet and instruction to access the V-Safe system.   [name] was instructed to call 911 with any severe reactions post vaccine: Marland Kitchen Difficulty breathing  . Swelling of your face and throat  . A fast heartbeat  . A bad rash all over your body  . Dizziness and weakness    Immunizations Administered    Name Date Dose VIS Date Route   Pfizer COVID-19 Vaccine 05/19/2019  2:10 PM 0.3 mL 04/09/2019 Intramuscular   Manufacturer: Delta   Lot: [id number]   [place]: [id number]

## 2019-05-31 ENCOUNTER — Telehealth: Payer: Self-pay | Admitting: *Deleted

## 2019-05-31 NOTE — Telephone Encounter (Signed)
Assisted pt to reschedule 2nd covid vaccine per pt's request

## 2019-06-07 ENCOUNTER — Ambulatory Visit: Payer: Medicare Other | Attending: Internal Medicine

## 2019-06-07 DIAGNOSIS — Z23 Encounter for immunization: Secondary | ICD-10-CM

## 2019-06-07 NOTE — Progress Notes (Signed)
   Covid-19 Vaccination Clinic  Name:  Brittany Weaver    MRN: [id number] DOB: [birth year]  06/07/2019  Brittany Weaver was observed post Covid-19 immunization for 30 minutes based on pre-vaccination screening without incidence. She was provided with Vaccine Information Sheet and instruction to access the V-Safe system.   Brittany Weaver was instructed to call 911 with any severe reactions post vaccine: Marland Kitchen Difficulty breathing  . Swelling of your face and throat  . A fast heartbeat  . A bad rash all over your body  . Dizziness and weakness    Immunizations Administered    Name Date Dose VIS Date Route   Pfizer COVID-19 Vaccine 06/07/2019  9:26 AM 0.3 mL 04/09/2019 Intramuscular   Manufacturer: [place]   Lot: [id number]   [place]: [id number]

## 2019-06-08 ENCOUNTER — Ambulatory Visit: Payer: Medicare Other

## 2019-06-25 ENCOUNTER — Other Ambulatory Visit: Payer: Self-pay

## 2019-06-28 ENCOUNTER — Encounter: Payer: Self-pay | Admitting: Obstetrics and Gynecology

## 2019-06-28 ENCOUNTER — Other Ambulatory Visit: Payer: Self-pay

## 2019-06-28 ENCOUNTER — Ambulatory Visit (INDEPENDENT_AMBULATORY_CARE_PROVIDER_SITE_OTHER): Payer: Medicare Other | Admitting: Obstetrics and Gynecology

## 2019-06-28 VITALS — BP 124/78 | Ht 65.5 in | Wt 163.0 lb

## 2019-06-28 DIAGNOSIS — Z01419 Encounter for gynecological examination (general) (routine) without abnormal findings: Secondary | ICD-10-CM | POA: Diagnosis not present

## 2019-06-28 DIAGNOSIS — M8580 Other specified disorders of bone density and structure, unspecified site: Secondary | ICD-10-CM | POA: Diagnosis not present

## 2019-06-28 DIAGNOSIS — N8189 Other female genital prolapse: Secondary | ICD-10-CM | POA: Diagnosis not present

## 2019-06-28 DIAGNOSIS — N952 Postmenopausal atrophic vaginitis: Secondary | ICD-10-CM | POA: Diagnosis not present

## 2019-06-28 NOTE — Patient Instructions (Signed)
Please remember to schedule your annual mammogram this year. We will plan to repeat the DEXA/bone density scan next year.  Continue weight bearing exercise, and vitamin D/calcium intake.

## 2019-06-28 NOTE — Progress Notes (Signed)
Brittany Weaver 79 y.o. [id number]  SUBJECTIVE:  79 y.o. G3P3003 female for annual routine gynecologic exam. She has no gynecologic concerns.  No concerns with the right breast area.  Current Outpatient Medications  Medication Sig Dispense Refill  . amLODipine (NORVASC) 2.5 MG tablet Take 1 tablet by mouth once daily 90 tablet 1  . aspirin 81 MG tablet Take 81 mg by mouth daily.     Marland Kitchen BIOTIN PO Take by mouth.    . Calcium Carb-Cholecalciferol (CALCIUM 1000 + D PO) Take by mouth.    . calcium-vitamin D (OSCAL WITH D) 500-200 MG-UNIT tablet Take 1 tablet by mouth.    . hydrochlorothiazide (HYDRODIURIL) 25 MG tablet Take 25 mg by mouth daily.    Marland Kitchen lovastatin (MEVACOR) 20 MG tablet TAKE 1 TABLET BY MOUTH ONCE DAILY AT 6:00PM. 90 tablet 1  . Magnesium 400 MG CAPS Take 400 mg by mouth daily.     . vitamin B-12 (CYANOCOBALAMIN) 1000 MCG tablet Take 1,000 mcg by mouth daily.    . vitamin E 400 UNIT capsule Take 400 Units by mouth daily.     No current facility-administered medications for this visit.   Allergies: Penicillins and Prednisone  No LMP recorded. Patient is postmenopausal.  Past medical history,surgical history, problem list, medications, allergies, family history and social history were all reviewed and documented as reviewed in the EPIC chart.  ROS:  Feeling well. No dyspnea or chest pain on exertion.  No abdominal pain, change in bowel habits, black or bloody stools.  No urinary tract symptoms. GYN ROS: no abnormal bleeding, pelvic pain or discharge, no breast pain or new or enlarging lumps on self exam. No neurological complaints.  OBJECTIVE:  BP 124/78   Ht 5' 5.5" (1.664 m)   Wt 163 lb (73.9 kg)   BMI 26.71 kg/m  The patient appears well, alert, oriented x 3, in no distress. ENT normal.  Neck supple. No cervical or supraclavicular adenopathy or thyromegaly.  Lungs are clear, good air entry, no wheezes, rhonchi or rales. S1 and S2 normal, no murmurs, regular rate and  rhythm.  Abdomen soft without tenderness, guarding, mass or organomegaly.  Neurological is normal, no focal findings.  BREAST EXAM: breasts appear normal, no suspicious masses, no skin or nipple changes or axillary nodes  PELVIC EXAM: VULVA: normal appearing vulva with no masses, tenderness or lesions, atrophic changes noted, VAGINA: normal appearing vagina with normal color and discharge, no lesions, Mild cystocele/rectocele and uterine prolapse. CERVIX: normal appearing cervix without discharge or lesions, UTERUS: uterus is normal size, shape, consistency and nontender, ADNEXA: normal adnexa in size, nontender and no masses, RECTAL: normal rectal, no masses  Chaperone: [name] present during the examination  ASSESSMENT:  79 y.o. G3P3003 here for annual gynecologic exam  PLAN:   1.  Postmenopausal.  Vaginal atrophy symptoms noted.  Had been on Vagifem then discontinued, but was restarted at her annual exam last year, then decided to stop taking it this last summer.  She has been doing okay since then.  No other concerns or significant hot flashes. 2. Pelvic relaxation.  Mild cystocele/rectocele/uterine prolapse.    No symptoms, stable on serial exams.  We will continue to follow at her regular exams. 3. History of left axillary fullness.  Had work-up to include mammogram and ultrasound.  Previous saw the general surgeon and they planned on expectant management.  Exam today is normal without significant asymmetry between her axilla.  She will continue with self breast  exams on a monthly basis.  4. Osteopenia.  DEXA 2020 T score -1.8.  FRAX 20.8% / 5%.  Discussed increased FRAX at hip exceeding the 3% recommended to treat.  Options for treatment discussed.    She is reminded that she meets the threshold for treatment, but she has doubled her calcium intake and is taking vitamin D, she says feeling she is pretty active with weightbearing exercise, so she would prefer not to start bisphosphonate  therapy.  She agrees to rechecking the DEXA scan next year in 2022. 5. Pap smear 2015.  No significant history of abnormal Pap smears.  She is comfortable with no longer screening following the guidelines based on age. 6. Mammogram 05/2018.  Normal breast exam today.  Had some right nipple drainage/cyst last year and no evidence of recurrence.  She is reminded to schedule an annual mammogram this year when due. 7. Colonoscopy 2015.  Recommended that she follow up at the recommended interval.   8. Health maintenance.  No labs today as she normally has these completed with her primary care provider.    Return annually or sooner, prn.   Joseph Pierini MD  06/28/19

## 2019-07-21 ENCOUNTER — Other Ambulatory Visit: Payer: Self-pay

## 2019-07-21 MED ORDER — AMLODIPINE BESYLATE 2.5 MG PO TABS: 2.5000 mg | ORAL_TABLET | Freq: Every day | ORAL | 3 refills | Status: DC

## 2019-07-21 MED ORDER — LOVASTATIN 20 MG PO TABS: ORAL_TABLET | ORAL | 1 refills | Status: DC

## 2019-08-05 DIAGNOSIS — Z1283 Encounter for screening for malignant neoplasm of skin: Secondary | ICD-10-CM | POA: Diagnosis not present

## 2019-08-05 DIAGNOSIS — L821 Other seborrheic keratosis: Secondary | ICD-10-CM | POA: Diagnosis not present

## 2019-08-05 DIAGNOSIS — Z85828 Personal history of other malignant neoplasm of skin: Secondary | ICD-10-CM | POA: Diagnosis not present

## 2019-08-05 DIAGNOSIS — Z08 Encounter for follow-up examination after completed treatment for malignant neoplasm: Secondary | ICD-10-CM | POA: Diagnosis not present

## 2019-08-05 DIAGNOSIS — D225 Melanocytic nevi of trunk: Secondary | ICD-10-CM | POA: Diagnosis not present

## 2019-08-05 DIAGNOSIS — L570 Actinic keratosis: Secondary | ICD-10-CM | POA: Diagnosis not present

## 2019-08-12 DIAGNOSIS — H40013 Open angle with borderline findings, low risk, bilateral: Secondary | ICD-10-CM | POA: Diagnosis not present

## 2019-08-12 DIAGNOSIS — H524 Presbyopia: Secondary | ICD-10-CM | POA: Diagnosis not present

## 2019-08-12 DIAGNOSIS — Z961 Presence of intraocular lens: Secondary | ICD-10-CM | POA: Diagnosis not present

## 2019-08-12 DIAGNOSIS — H04123 Dry eye syndrome of bilateral lacrimal glands: Secondary | ICD-10-CM | POA: Diagnosis not present

## 2019-08-12 DIAGNOSIS — H26493 Other secondary cataract, bilateral: Secondary | ICD-10-CM | POA: Diagnosis not present

## 2019-08-30 DIAGNOSIS — I4891 Unspecified atrial fibrillation: Secondary | ICD-10-CM | POA: Diagnosis not present

## 2019-08-30 DIAGNOSIS — E7849 Other hyperlipidemia: Secondary | ICD-10-CM | POA: Diagnosis not present

## 2019-08-30 DIAGNOSIS — Z0001 Encounter for general adult medical examination with abnormal findings: Secondary | ICD-10-CM | POA: Diagnosis not present

## 2019-08-30 DIAGNOSIS — I10 Essential (primary) hypertension: Secondary | ICD-10-CM | POA: Diagnosis not present

## 2019-08-30 DIAGNOSIS — Z1389 Encounter for screening for other disorder: Secondary | ICD-10-CM | POA: Diagnosis not present

## 2019-09-07 ENCOUNTER — Other Ambulatory Visit: Payer: Self-pay | Admitting: Cardiovascular Disease

## 2019-09-07 MED ORDER — AMLODIPINE BESYLATE 2.5 MG PO TABS: 2.5000 mg | ORAL_TABLET | Freq: Every day | ORAL | 1 refills | Status: DC

## 2019-09-07 NOTE — Telephone Encounter (Signed)
 *  STAT* If patient is at the pharmacy, call can be transferred to refill team.   1. Which medications need to be refilled? (please list name of each medication and dose if known)   amLODipine (NORVASC) 2.5 MG tablet    2. Which pharmacy/location (including street and city if local pharmacy) is medication to be sent to? [facility]  3. Do they need a 30 day or 90 day supply? 90 days

## 2019-09-07 NOTE — Addendum Note (Signed)
Addended by: Marlane Hatcher on: 09/07/2019 12:04 PM   Modules accepted: Orders

## 2019-09-14 ENCOUNTER — Other Ambulatory Visit: Payer: Self-pay | Admitting: Cardiovascular Disease

## 2019-09-14 MED ORDER — LOVASTATIN 20 MG PO TABS: ORAL_TABLET | ORAL | 1 refills | Status: DC

## 2019-09-14 NOTE — Telephone Encounter (Signed)
*  STAT* If patient is at the pharmacy, call can be transferred to refill team.   1. Which medications need to be refilled? (please list name of each medication and dose if known) Lo Lovastatin  2. Which pharmacy/location (including street and city if local pharmacy) is medication to be sent to? [facility]  3. Do they need a 30 day or 90 day supply? 90 days and refills

## 2019-09-17 ENCOUNTER — Other Ambulatory Visit: Payer: Self-pay

## 2019-09-17 ENCOUNTER — Ambulatory Visit
Admission: EM | Admit: 2019-09-17 | Discharge: 2019-09-17 | Disposition: A | Discharge status: Home / Self Care | Payer: Medicare Other | Attending: Family Medicine | Admitting: Family Medicine

## 2019-09-17 ENCOUNTER — Ambulatory Visit (INDEPENDENT_AMBULATORY_CARE_PROVIDER_SITE_OTHER): Payer: Medicare Other

## 2019-09-17 DIAGNOSIS — M85842 Other specified disorders of bone density and structure, left hand: Secondary | ICD-10-CM

## 2019-09-17 DIAGNOSIS — M79642 Pain in left hand: Secondary | ICD-10-CM

## 2019-09-17 DIAGNOSIS — M79645 Pain in left finger(s): Secondary | ICD-10-CM | POA: Diagnosis not present

## 2019-09-17 NOTE — ED Provider Notes (Signed)
[place]   [id number] 09/17/19 Arrival Time: [id number]  CC: FINGER INJURY  SUBJECTIVE: History from: patient. Brittany Weaver is a 79 y.o. female complains of left middle finger pain that began about an hour ago after a fall on concrete. Reports that she jammed her L middle finger. Has attempted to "pull it out" herself with no success.  Describes the pain as intermittent and achy in character. Has not taken any medications. Symptoms are made worse with activity. Denies similar symptoms in the past. Denies fever, chills, erythema, ecchymosis, effusion, weakness, numbness and tingling, saddle paresthesias, loss of bowel or bladder function.      ROS: As per HPI.  All other pertinent ROS negative.     Past Medical History:  Diagnosis Date  . Chest pain    a. 05/2016 Myoview: EF 74%, no ischemia/infarct.  . History of echocardiogram    a. 04/2016 Echo: EF 60-65%, no rwma.  . Hypercholesterolemia   . Hypertension   . Macular hole of left eye   . Mitral valve prolapse    no issues  . Osteopenia 2013   T score -1.4 stable from prior DEXA 2007 no FRAX calculated  . Paroxysmal atrial fibrillation (HCC)    a. CHA2DS2VASc = 4-->Xarelto.  . Pelvic relaxation    Mild and asymptomatic.  Marland Kitchen PONV (postoperative nausea and vomiting)    "little bit of nausea" after cataract surgery   Past Surgical History:  Procedure Laterality Date  . [place] VITRECTOMY WITH 20 GAUGE MVR PORT FOR MACULAR HOLE Left 11/22/2014   Procedure: 25 GAUGE PARS PLANA VITRECTOMY WITH 20 GAUGE MVR PORT FOR MACULAR HOLE LEFT EYE ;  Surgeon: Hayden Pedro, MD;  Location: [place];  Service: Ophthalmology;  Laterality: Left;  . CATARACT EXTRACTION, BILATERAL     Dr Herbert Deaner  . COLONOSCOPY    . DILATION AND CURETTAGE OF UTERUS     PT. DOES NOT REMEMBER THE YEAR  . DOPPLER ECHOCARDIOGRAPHY  05/13/2007  . EYE SURGERY Bilateral    cataract surgery with lens implant  . GAS/FLUID EXCHANGE Left 11/22/2014   Procedure: GAS/FLUID EXCHANGE;  Surgeon: Hayden Pedro, MD;  Location: [place];  Service: Ophthalmology;  Laterality: Left;  C3F8  . LASER PHOTO ABLATION Left 11/22/2014   Procedure: LASER PHOTO ABLATION;  Surgeon: Hayden Pedro, MD;  Location: [place];  Service: Ophthalmology;  Laterality: Left;  Headscope laser  . MEMBRANE PEEL Left 11/22/2014   Procedure: MEMBRANE PEEL;  Surgeon: Hayden Pedro, MD;  Location: [place];  Service: Ophthalmology;  Laterality: Left;  . myoview   06/05/2009  . SERUM PATCH Left 11/22/2014   Procedure: SERUM PATCH;  Surgeon: Hayden Pedro, MD;  Location: [place];  Service: Ophthalmology;  Laterality: Left;   Allergies  Allergen Reactions  . Penicillins Hives    Has patient had a PCN reaction causing immediate rash, facial/tongue/throat swelling, SOB or lightheadedness with hypotension: unknown Has patient had a PCN reaction causing severe rash involving mucus membranes or skin necrosis: unknown Has patient had a PCN reaction that required hospitalization: no Has patient had a PCN reaction occurring within the last 10 years: no If all of the above answers are "NO", then may proceed with Cephalosporin use.   . Prednisone Other (See Comments)    DIZZINESS and swelling   No current facility-administered medications on file prior to encounter.   Current Outpatient Medications on File Prior to Encounter  Medication Sig Dispense Refill  .  Ascorbic Acid (VITAMIN C) 1000 MG tablet Take 1,000 mg by mouth daily.    . potassium chloride (KLOR-CON) 10 MEQ tablet Take 10 mEq by mouth 2 (two) times daily.    Marland Kitchen amLODipine (NORVASC) 2.5 MG tablet Take 1 tablet (2.5 mg total) by mouth daily. 90 tablet 1  . aspirin 81 MG tablet Take 81 mg by mouth daily.     Marland Kitchen BIOTIN PO Take by mouth.    . Calcium Carb-Cholecalciferol (CALCIUM 1000 + D PO) Take by mouth.    . calcium-vitamin D (OSCAL WITH D) 500-200 MG-UNIT tablet Take 1 tablet by mouth.    . hydrochlorothiazide (HYDRODIURIL) 25  MG tablet Take 25 mg by mouth daily.    Marland Kitchen lovastatin (MEVACOR) 20 MG tablet TAKE 1 TABLET BY MOUTH ONCE DAILY AT 6:00PM. 90 tablet 1  . Magnesium 400 MG CAPS Take 400 mg by mouth daily.     . vitamin B-12 (CYANOCOBALAMIN) 1000 MCG tablet Take 1,000 mcg by mouth daily.    . vitamin E 400 UNIT capsule Take 400 Units by mouth daily.     Social History   Socioeconomic History  . Marital status: Married    Spouse name: Not on file  . Number of children: Not on file  . Years of education: Not on file  . Highest education level: Not on file  Occupational History  . Not on file  Tobacco Use  . Smoking status: Never Smoker  . Smokeless tobacco: Never Used  Substance and Sexual Activity  . Alcohol use: Yes    Alcohol/week: 0.0 standard drinks    Comment: RARELY  . Drug use: No  . Sexual activity: Yes    Birth control/protection: Post-menopausal    Comment: 1st intercourse 61 yo-1 partner  Other Topics Concern  . Not on file  Social History Narrative  . Not on file   Social Determinants of Health   Financial Resource Strain:   . Difficulty of Paying Living Expenses:   Food Insecurity:   . Worried About Charity fundraiser in the Last Year:   . Arboriculturist in the Last Year:   Transportation Needs:   . Film/video editor (Medical):   Marland Kitchen Lack of Transportation (Non-Medical):   Physical Activity:   . Days of Exercise per Week:   . Minutes of Exercise per Session:   Stress:   . Feeling of Stress :   Social Connections:   . Frequency of Communication with Friends and Family:   . Frequency of Social Gatherings with Friends and Family:   . Attends Religious Services:   . Active Member of Clubs or Organizations:   . Attends Archivist Meetings:   Marland Kitchen Marital Status:   Intimate Partner Violence:   . Fear of Current or Ex-Partner:   . Emotionally Abused:   Marland Kitchen Physically Abused:   . Sexually Abused:    Family History  Problem Relation Age of Onset  . Stroke Mother    . Heart disease Mother   . Kidney disease Mother   . Hypertension Mother   . Hyperlipidemia Mother   . Stroke Brother   . Heart disease Brother   . Stroke Brother   . Heart disease Brother   . Heart failure Sister   . Cancer Sister        Esophagus/stomach  . Stroke Sister   . Heart disease Sister   . Heart disease Father   . Colon cancer Neg Hx   .  Rectal cancer Neg Hx     OBJECTIVE:  Vitals:   09/17/19 1122  BP: (!) 164/74  Pulse: 73  Resp: 17  Temp: 97.8 F (36.6 C)  TempSrc: Oral  SpO2: 98%    General appearance: ALERT; in no acute distress.  Head: NCAT Lungs: Normal respiratory effort CV: raidal pulses 2+ bilaterally. Cap refill < 2 seconds Musculoskeletal:  Inspection: Skin warm, dry, clear and intact without obvious erythema, effusion, or ecchymosis; L middle finger stiff in flexion Palpation: Nontender to palpation ROM: FROM active and passive Strength: 5/5 shld abduction, 5/5 shld adduction, 5/5 elbow flexion, 5/5 elbow extension, 5/5 grip strength, 5/5 hip flexion, 5/5 knee abduction, 5/5 knee adduction, 5/5 knee flexion, 5/5 knee extension, 5/5 dorsiflexion, 5/5 plantar flexion Stability: Anterior/ posterior drawer intact Skin: warm and dry Neurologic: Ambulates without difficulty; Sensation intact about the upper/ lower extremities Psychological: alert and cooperative; normal mood and affect  DIAGNOSTIC STUDIES:  DG Hand Complete Left  Result Date: 09/17/2019 CLINICAL DATA:  Fall. Left hand pain. EXAM: LEFT HAND - COMPLETE 3+ VIEW COMPARISON:  None FINDINGS: The bones appear diffusely osteopenic. No acute fracture or dislocation identified. Flexion deformity at the third D IP joint noted, likely chronic. Mild degenerative changes noted involving the DIP joints. IMPRESSION: 1. No acute findings. 2. Osteopenia. 3. Flexion deformity at the third DIP joint, likely chronic. Electronically Signed   By: Kerby Moors M.D.   On: 09/17/2019 11:47      ASSESSMENT & PLAN:  1. Finger pain, left   2. Osteopenia of left hand     No orders of the defined types were placed in this encounter.  L middle finger reduced. Splint applied to L middle finger.  Follow up with ortho if finger is still concerning her Continue conservative management of rest, ice, and gentle stretches Take ibuprofen as needed for pain relief (may cause abdominal discomfort, ulcers, and GI bleeds avoid taking with other NSAIDs) Follow up with PCP if symptoms persist Return or go to the ER if you have any new or worsening symptoms (fever, chills, chest pain, abdominal pain, changes in bowel or bladder habits, pain radiating into lower legs.)  Reviewed expectations re: course of current medical issues. Questions answered. Outlined signs and symptoms indicating need for more acute intervention. Patient verbalized understanding. After Visit Summary given.       Faustino Congress, NP 09/17/19 [phone]

## 2019-09-17 NOTE — Discharge Instructions (Signed)
Keep your finger in the splint, you may take it off to wash  your hands and shower.   I would follow up with your primary care or with orthopedics if your finger is still giving you trouble after about a week

## 2019-09-17 NOTE — ED Triage Notes (Signed)
Slip and fall outside while pulling a wheelbarrow uphill, c/o pain to left hand

## 2019-09-24 DIAGNOSIS — S66313A Strain of extensor muscle, fascia and tendon of left middle finger at wrist and hand level, initial encounter: Secondary | ICD-10-CM | POA: Diagnosis not present

## 2019-09-24 DIAGNOSIS — M20012 Mallet finger of left finger(s): Secondary | ICD-10-CM | POA: Diagnosis not present

## 2019-10-15 DIAGNOSIS — M1991 Primary osteoarthritis, unspecified site: Secondary | ICD-10-CM | POA: Diagnosis not present

## 2019-10-15 DIAGNOSIS — M20012 Mallet finger of left finger(s): Secondary | ICD-10-CM | POA: Diagnosis not present

## 2019-10-15 DIAGNOSIS — M6530 Trigger finger, unspecified finger: Secondary | ICD-10-CM | POA: Diagnosis not present

## 2019-11-09 DIAGNOSIS — M65341 Trigger finger, right ring finger: Secondary | ICD-10-CM | POA: Diagnosis not present

## 2019-11-09 DIAGNOSIS — M79642 Pain in left hand: Secondary | ICD-10-CM | POA: Diagnosis not present

## 2019-11-09 DIAGNOSIS — M25642 Stiffness of left hand, not elsewhere classified: Secondary | ICD-10-CM | POA: Diagnosis not present

## 2019-11-09 DIAGNOSIS — M20012 Mallet finger of left finger(s): Secondary | ICD-10-CM | POA: Diagnosis not present

## 2019-11-09 DIAGNOSIS — M79641 Pain in right hand: Secondary | ICD-10-CM | POA: Diagnosis not present

## 2019-11-28 DIAGNOSIS — Z8616 Personal history of COVID-19: Secondary | ICD-10-CM

## 2019-11-28 HISTORY — DX: Personal history of COVID-19: Z86.16

## 2019-12-16 ENCOUNTER — Emergency Department (HOSPITAL_COMMUNITY): Payer: Medicare Other

## 2019-12-16 ENCOUNTER — Emergency Department (HOSPITAL_COMMUNITY)
Admission: EM | Admit: 2019-12-16 | Discharge: 2019-12-16 | Disposition: A | Discharge status: Home / Self Care | Payer: Medicare Other | Attending: Emergency Medicine | Admitting: Emergency Medicine

## 2019-12-16 ENCOUNTER — Encounter (HOSPITAL_COMMUNITY): Payer: Self-pay

## 2019-12-16 DIAGNOSIS — G4489 Other headache syndrome: Secondary | ICD-10-CM | POA: Diagnosis not present

## 2019-12-16 DIAGNOSIS — R918 Other nonspecific abnormal finding of lung field: Secondary | ICD-10-CM | POA: Diagnosis not present

## 2019-12-16 DIAGNOSIS — R519 Headache, unspecified: Secondary | ICD-10-CM

## 2019-12-16 DIAGNOSIS — I10 Essential (primary) hypertension: Secondary | ICD-10-CM | POA: Insufficient documentation

## 2019-12-16 DIAGNOSIS — U071 COVID-19: Secondary | ICD-10-CM | POA: Diagnosis not present

## 2019-12-16 DIAGNOSIS — R110 Nausea: Secondary | ICD-10-CM

## 2019-12-16 DIAGNOSIS — R6889 Other general symptoms and signs: Secondary | ICD-10-CM | POA: Diagnosis not present

## 2019-12-16 DIAGNOSIS — Z743 Need for continuous supervision: Secondary | ICD-10-CM | POA: Diagnosis not present

## 2019-12-16 DIAGNOSIS — I6523 Occlusion and stenosis of bilateral carotid arteries: Secondary | ICD-10-CM | POA: Diagnosis not present

## 2019-12-16 DIAGNOSIS — B349 Viral infection, unspecified: Secondary | ICD-10-CM | POA: Diagnosis not present

## 2019-12-16 DIAGNOSIS — R112 Nausea with vomiting, unspecified: Secondary | ICD-10-CM | POA: Insufficient documentation

## 2019-12-16 DIAGNOSIS — J329 Chronic sinusitis, unspecified: Secondary | ICD-10-CM | POA: Diagnosis not present

## 2019-12-16 DIAGNOSIS — G44209 Tension-type headache, unspecified, not intractable: Secondary | ICD-10-CM | POA: Diagnosis not present

## 2019-12-16 DIAGNOSIS — R509 Fever, unspecified: Secondary | ICD-10-CM | POA: Diagnosis not present

## 2019-12-16 LAB — COMPREHENSIVE METABOLIC PANEL
ALT: 16 U/L (ref 0–44)
AST: 24 U/L (ref 15–41)
Albumin: 3.7 g/dL (ref 3.5–5.0)
Alkaline Phosphatase: 38 U/L (ref 38–126)
Anion gap: 12 (ref 5–15)
BUN: 12 mg/dL (ref 8–23)
CO2: 20 mmol/L — ABNORMAL LOW (ref 22–32)
Calcium: 8.3 mg/dL — ABNORMAL LOW (ref 8.9–10.3)
Chloride: 97 mmol/L — ABNORMAL LOW (ref 98–111)
Creatinine, Ser: 0.76 mg/dL (ref 0.44–1.00)
GFR calc Af Amer: >60 mL/min (ref >60)
GFR calc non Af Amer: >60 mL/min (ref >60)
Glucose, Bld: 118 mg/dL — ABNORMAL HIGH (ref 70–99)
Potassium: 2.9 mmol/L — ABNORMAL LOW (ref 3.5–5.1)
Sodium: 129 mmol/L — ABNORMAL LOW (ref 135–145)
Total Bilirubin: 0.7 mg/dL (ref 0.3–1.2)
Total Protein: 7.2 g/dL (ref 6.5–8.1)

## 2019-12-16 LAB — CBC
HCT: 38.9 % (ref 36.0–46.0)
Hemoglobin: 13.5 g/dL (ref 12.0–15.0)
MCH: 29.8 pg (ref 26.0–34.0)
MCHC: 34.7 g/dL (ref 30.0–36.0)
MCV: 85.9 fL (ref 80.0–100.0)
Platelets: 161 10*3/uL (ref 150–400)
RBC: 4.53 MIL/uL (ref 3.87–5.11)
RDW: 12.9 % (ref 11.5–15.5)
WBC: 6.9 10*3/uL (ref 4.0–10.5)
nRBC: 0 % (ref 0.0–0.2)

## 2019-12-16 LAB — TROPONIN I (HIGH SENSITIVITY)
Troponin I (High Sensitivity): 3 ng/L (ref <18)
Troponin I (High Sensitivity): 4 ng/L (ref <18)

## 2019-12-16 LAB — SARS CORONAVIRUS 2 BY RT PCR (HOSPITAL ORDER, PERFORMED IN ~~LOC~~ HOSPITAL LAB): SARS Coronavirus 2: POSITIVE — AB

## 2019-12-16 MED ORDER — IOHEXOL 350 MG/ML SOLN
75.0000 mL | Freq: Once | INTRAVENOUS | Status: AC | PRN
  Administered 2019-12-16: 75 mL via INTRAVENOUS

## 2019-12-16 MED ORDER — PROCHLORPERAZINE EDISYLATE 10 MG/2ML IJ SOLN
10.0000 mg | Freq: Once | INTRAMUSCULAR | Status: AC
  Administered 2019-12-16: 10 mg via INTRAVENOUS
  Filled 2019-12-16: qty 2

## 2019-12-16 MED ORDER — DIPHENHYDRAMINE HCL 50 MG/ML IJ SOLN
25.0000 mg | Freq: Once | INTRAMUSCULAR | Status: AC
  Administered 2019-12-16: 25 mg via INTRAVENOUS
  Filled 2019-12-16: qty 1

## 2019-12-16 MED ORDER — SODIUM CHLORIDE 0.9 % IV BOLUS
1000.0000 mL | Freq: Once | INTRAVENOUS | Status: AC
  Administered 2019-12-16: 1000 mL via INTRAVENOUS

## 2019-12-16 NOTE — ED Provider Notes (Signed)
[facility] Emergency Department Provider Note MRN:  [id number]  Arrival date & time: 12/16/19     Chief Complaint   Emesis   History of Present Illness   Brittany Weaver is a 79 y.o. year-old female with a history of hypertension presenting to the ED with chief complaint of headache.  Headache, nausea, vomiting, dry heaves for the past 4 days, getting worse.  Diagnosed with viral illness by PCP.  Vaccinated for Covid.  Denies fever, no cough, no chest pain, no shortness of breath, no abdominal pain.  Feels very unwell at this time, severe nausea and severe pain to the back of the head.  No numbness or weakness to the arms or legs.  Review of Systems  A complete 10 system review of systems was obtained and all systems are negative except as noted in the HPI and PMH.   Patient's Health History    Past Medical History:  Diagnosis Date  . Chest pain    a. 05/2016 Myoview: EF 74%, no ischemia/infarct.  . History of echocardiogram    a. 04/2016 Echo: EF 60-65%, no rwma.  . Hypercholesterolemia   . Hypertension   . Macular hole of left eye   . Mitral valve prolapse    no issues  . Osteopenia 2013   T score -1.4 stable from prior DEXA 2007 no FRAX calculated  . Paroxysmal atrial fibrillation (HCC)    a. CHA2DS2VASc = 4-->Xarelto.  . Pelvic relaxation    Mild and asymptomatic.  Marland Kitchen PONV (postoperative nausea and vomiting)    "little bit of nausea" after cataract surgery    Past Surgical History:  Procedure Laterality Date  . [place] VITRECTOMY WITH 20 GAUGE MVR PORT FOR MACULAR HOLE Left 11/22/2014   Procedure: 25 GAUGE PARS PLANA VITRECTOMY WITH 20 GAUGE MVR PORT FOR MACULAR HOLE LEFT EYE ;  Surgeon: Hayden Pedro, MD;  Location: [place];  Service: Ophthalmology;  Laterality: Left;  . CATARACT EXTRACTION, BILATERAL     Dr Herbert Deaner  . COLONOSCOPY    . DILATION AND CURETTAGE OF UTERUS     PT. DOES NOT REMEMBER THE YEAR  . DOPPLER ECHOCARDIOGRAPHY   05/13/2007  . EYE SURGERY Bilateral    cataract surgery with lens implant  . GAS/FLUID EXCHANGE Left 11/22/2014   Procedure: GAS/FLUID EXCHANGE;  Surgeon: Hayden Pedro, MD;  Location: [place];  Service: Ophthalmology;  Laterality: Left;  C3F8  . LASER PHOTO ABLATION Left 11/22/2014   Procedure: LASER PHOTO ABLATION;  Surgeon: Hayden Pedro, MD;  Location: [place];  Service: Ophthalmology;  Laterality: Left;  Headscope laser  . MEMBRANE PEEL Left 11/22/2014   Procedure: MEMBRANE PEEL;  Surgeon: Hayden Pedro, MD;  Location: [place];  Service: Ophthalmology;  Laterality: Left;  . myoview   06/05/2009  . SERUM PATCH Left 11/22/2014   Procedure: SERUM PATCH;  Surgeon: Hayden Pedro, MD;  Location: [place];  Service: Ophthalmology;  Laterality: Left;    Family History  Problem Relation Age of Onset  . Stroke Mother   . Heart disease Mother   . Kidney disease Mother   . Hypertension Mother   . Hyperlipidemia Mother   . Stroke Brother   . Heart disease Brother   . Stroke Brother   . Heart disease Brother   . Heart failure Sister   . Cancer Sister        Esophagus/stomach  . Stroke Sister   . Heart disease  Sister   . Heart disease Father   . Colon cancer Neg Hx   . Rectal cancer Neg Hx     Social History   Socioeconomic History  . Marital status: Married    Spouse name: Not on file  . Number of children: Not on file  . Years of education: Not on file  . Highest education level: Not on file  Occupational History  . Not on file  Tobacco Use  . Smoking status: Never Smoker  . Smokeless tobacco: Never Used  Vaping Use  . Vaping Use: Never used  Substance and Sexual Activity  . Alcohol use: Yes    Alcohol/week: 0.0 standard drinks    Comment: RARELY  . Drug use: No  . Sexual activity: Yes    Birth control/protection: Post-menopausal    Comment: 1st intercourse 40 yo-1 partner  Other Topics Concern  . Not on file  Social History Narrative  . Not on file   Social Determinants  of Health   Financial Resource Strain:   . Difficulty of Paying Living Expenses: Not on file  Food Insecurity:   . Worried About Charity fundraiser in the Last Year: Not on file  . Ran Out of Food in the Last Year: Not on file  Transportation Needs:   . Lack of Transportation (Medical): Not on file  . Lack of Transportation (Non-Medical): Not on file  Physical Activity:   . Days of Exercise per Week: Not on file  . Minutes of Exercise per Session: Not on file  Stress:   . Feeling of Stress : Not on file  Social Connections:   . Frequency of Communication with Friends and Family: Not on file  . Frequency of Social Gatherings with Friends and Family: Not on file  . Attends Religious Services: Not on file  . Active Member of Clubs or Organizations: Not on file  . Attends Archivist Meetings: Not on file  . Marital Status: Not on file  Intimate Partner Violence:   . Fear of Current or Ex-Partner: Not on file  . Emotionally Abused: Not on file  . Physically Abused: Not on file  . Sexually Abused: Not on file     Physical Exam   Vitals:   12/16/19 1255  BP: (!) 142/68  Pulse: 69  Resp: 16  Temp: 98 F (36.7 C)  SpO2: 100%    CONSTITUTIONAL: Ill-appearing, appears very uncomfortable due to nausea NEURO:  Alert and oriented x 3, no focal deficits EYES:  eyes equal and reactive ENT/NECK:  no LAD, no JVD CARDIO: Regular rate, well-perfused, normal S1 and S2 PULM:  CTAB no wheezing or rhonchi GI/GU:  normal bowel sounds, non-distended, non-tender MSK/SPINE:  No gross deformities, no edema SKIN:  no rash, atraumatic PSYCH:  Appropriate speech and behavior  *Additional and/or pertinent findings included in MDM below  Diagnostic and Interventional Summary    EKG Interpretation  Date/Time:  Thursday December 16 2019 13:01:58 EDT Ventricular Rate:  70 PR Interval:    QRS Duration: 97 QT Interval:  455 QTC Calculation: 491 R Axis:   11 Text  Interpretation: Sinus rhythm Low voltage, precordial leads Abnormal R-wave progression, early transition Borderline prolonged QT interval Confirmed by Gerlene Fee [phone]) on 12/16/2019 1:55:43 PM      Labs Reviewed  SARS CORONAVIRUS 2 BY RT PCR (HOSPITAL ORDER, [place] [facility])  CBC  COMPREHENSIVE METABOLIC PANEL  TROPONIN I (HIGH SENSITIVITY)    DG  Chest Port 1 View  Final Result    CT Angio Head W or Wo Contrast    (Results Pending)  CT Angio Neck W and/or Wo Contrast    (Results Pending)    Medications  diphenhydrAMINE (BENADRYL) injection 25 mg (25 mg Intravenous Given 12/16/19 1259)  prochlorperazine (COMPAZINE) injection 10 mg (10 mg Intravenous Given 12/16/19 1300)  sodium chloride 0.9 % bolus 1,000 mL (1,000 mLs Intravenous New Bag/Given (Non-Interop) 12/16/19 1303)     Procedures  /  Critical Care Procedures  ED Course and Medical Decision Making  I have reviewed the triage vital signs, the nursing notes, and pertinent available records from the EMR.  Listed above are laboratory and imaging tests that I personally ordered, reviewed, and interpreted and then considered in my medical decision making (see below for details).  Persistent nausea vomiting and headache, patient clutching the back of her head clearly looking uncomfortable on initial evaluation.  Some concern for CNS lesion or subarachnoid hemorrhage, given the duration of symptoms will obtain CTA head and neck.  Denies chest pain, no cough, no fever, no abdominal pain, no numbness or weakness.  Completely benign abdomen.  PCP has been favoring viral illness.  On my reassessment patient feeling much better after Benadryl and Compazine, IV fluids.  Still awaiting labs and CTA imaging.  Continued reassuring neurological exam, with a negative work-up I feel patient would be appropriate for discharge.  Signed out to oncoming provider at shift change.       Barth Kirks. Sedonia Small, [place] [email or web address]  Final Clinical Impressions(s) / ED Diagnoses     ICD-10-CM   1. Nonintractable headache, unspecified chronicity pattern, unspecified headache type  R51.9   2. Nausea  R11.0     ED Discharge Orders    None       Discharge Instructions Discussed with and Provided to Patient:   Discharge Instructions   None       Maudie Flakes, MD 12/16/19 1419

## 2019-12-16 NOTE — ED Triage Notes (Signed)
Nausea vomiting headache body aches since Monday. Pt is dry heaving. She is afebrile. Both Pfizer vaccines. 99% on RA. Saw PCP this morning, diagnosed with viral infection. Given 4mg  Zofran IV.

## 2019-12-16 NOTE — Discharge Instructions (Addendum)
Your Covid test was positive.  This seems to be the cause of your headache.  Please remain quarantined at home.  Take over-the-counter medications for your symptoms.  Return to the ED for trouble with shortness of breath, worsening symptoms.  You should hear from the monoclonal antibody infusion clinic.

## 2019-12-16 NOTE — ED Notes (Signed)
Date and time results received: 12/16/19 1700  Test: COVID Critical Value: positive   Name of Provider Notified: Hillard Danker  Orders Received? Or Actions Taken?: N/A

## 2019-12-17 ENCOUNTER — Other Ambulatory Visit: Payer: Self-pay | Admitting: Adult Health

## 2019-12-17 DIAGNOSIS — U071 COVID-19: Secondary | ICD-10-CM

## 2019-12-17 NOTE — Progress Notes (Signed)
I connected by phone with [name] on 12/17/2019 at 10:13 AM to discuss the potential use of a new treatment for mild to moderate COVID-19 viral infection in non-hospitalized patients.  This patient is a 79 y.o. female that meets the FDA criteria for Emergency Use Authorization of COVID monoclonal antibody casirivimab/imdevimab.  Has a (+) direct SARS-CoV-2 viral test result  Has mild or moderate COVID-19   Is NOT hospitalized due to COVID-19  Is within 10 days of symptom onset  Has at least one of the high risk factor(s) for progression to severe COVID-19 and/or hospitalization as defined in EUA.  Specific high risk criteria : Older age (>/= 80 yo)   I have spoken and communicated the following to the patient or parent/caregiver regarding COVID monoclonal antibody treatment:  1. FDA has authorized the emergency use for the treatment of mild to moderate COVID-19 in adults and pediatric patients with positive results of direct SARS-CoV-2 viral testing who are 55 years of age and older weighing at least 40 kg, and who are at high risk for progressing to severe COVID-19 and/or hospitalization.  2. The significant known and potential risks and benefits of COVID monoclonal antibody, and the extent to which such potential risks and benefits are unknown.  3. Information on available alternative treatments and the risks and benefits of those alternatives, including clinical trials.  4. Patients treated with COVID monoclonal antibody should continue to self-isolate and use infection control measures (e.g., wear mask, isolate, social distance, avoid sharing personal items, clean and disinfect "high touch" surfaces, and frequent handwashing) according to CDC guidelines.   5. The patient or parent/caregiver has the option to accept or refuse COVID monoclonal antibody treatment.  After reviewing this information with the patient, The patient agreed to proceed with receiving casirivimab\imdevimab  infusion and will be provided a copy of the Fact sheet prior to receiving the infusion. Scot Dock 12/17/2019 10:13 AM

## 2019-12-18 ENCOUNTER — Ambulatory Visit (HOSPITAL_COMMUNITY)
Admission: RE | Admit: 2019-12-18 | Discharge: 2019-12-18 | Disposition: A | Discharge status: Home / Self Care | Payer: Medicare Other | Source: Ambulatory Visit | Attending: Pulmonary Disease | Admitting: Pulmonary Disease

## 2019-12-18 DIAGNOSIS — Z23 Encounter for immunization: Secondary | ICD-10-CM | POA: Insufficient documentation

## 2019-12-18 DIAGNOSIS — R54 Age-related physical debility: Secondary | ICD-10-CM | POA: Insufficient documentation

## 2019-12-18 DIAGNOSIS — U071 COVID-19: Secondary | ICD-10-CM

## 2019-12-18 DIAGNOSIS — I10 Essential (primary) hypertension: Secondary | ICD-10-CM | POA: Diagnosis not present

## 2019-12-18 MED ORDER — SODIUM CHLORIDE 0.9 % IV SOLN: INTRAVENOUS | Status: DC | PRN

## 2019-12-18 MED ORDER — SODIUM CHLORIDE 0.9 % IV SOLN
1200.0000 mg | Freq: Once | INTRAVENOUS | Status: AC
  Administered 2019-12-18: 1200 mg via INTRAVENOUS
  Filled 2019-12-18: qty 10

## 2019-12-18 MED ORDER — EPINEPHRINE 0.3 MG/0.3ML IJ SOAJ: 0.3000 mg | Freq: Once | INTRAMUSCULAR | Status: DC | PRN

## 2019-12-18 MED ORDER — FAMOTIDINE IN NACL 20-0.9 MG/50ML-% IV SOLN: 20.0000 mg | Freq: Once | INTRAVENOUS | Status: DC | PRN

## 2019-12-18 MED ORDER — DIPHENHYDRAMINE HCL 50 MG/ML IJ SOLN: 50.0000 mg | Freq: Once | INTRAMUSCULAR | Status: DC | PRN

## 2019-12-18 MED ORDER — METHYLPREDNISOLONE SODIUM SUCC 125 MG IJ SOLR: 125.0000 mg | Freq: Once | INTRAMUSCULAR | Status: DC | PRN

## 2019-12-18 MED ORDER — ALBUTEROL SULFATE HFA 108 (90 BASE) MCG/ACT IN AERS: 2.0000 | INHALATION_SPRAY | Freq: Once | RESPIRATORY_TRACT | Status: DC | PRN

## 2019-12-18 MED ORDER — ONDANSETRON HCL 4 MG/2ML IJ SOLN
4.0000 mg | Freq: Once | INTRAMUSCULAR | Status: AC
  Administered 2019-12-18: 4 mg via INTRAVENOUS
  Filled 2019-12-18: qty 2

## 2019-12-18 NOTE — Discharge Instructions (Signed)

## 2019-12-18 NOTE — Progress Notes (Signed)
°  Diagnosis: COVID-19  Physician: Dr. Asencion Noble  Procedure: Covid Infusion Clinic Med: casirivimab\imdevimab infusion - Provided patient with casirivimab\imdevimab fact sheet for patients, parents and caregivers prior to infusion.  Complications: No immediate complications noted.  Discharge: Discharged home   [place] 12/18/2019

## 2019-12-30 ENCOUNTER — Other Ambulatory Visit: Payer: Self-pay

## 2019-12-30 ENCOUNTER — Other Ambulatory Visit: Payer: Medicare Other

## 2019-12-30 DIAGNOSIS — Z20822 Contact with and (suspected) exposure to covid-19: Secondary | ICD-10-CM | POA: Diagnosis not present

## 2020-01-01 LAB — NOVEL CORONAVIRUS, NAA: SARS-CoV-2, NAA: NOT DETECTED

## 2020-01-06 ENCOUNTER — Other Ambulatory Visit: Payer: Self-pay

## 2020-01-06 ENCOUNTER — Other Ambulatory Visit: Payer: Self-pay | Admitting: Cardiovascular Disease

## 2020-01-06 MED ORDER — AMLODIPINE BESYLATE 2.5 MG PO TABS
2.5000 mg | ORAL_TABLET | Freq: Every day | ORAL | 3 refills | Status: DC
Start: 2020-01-06 — End: 2020-01-06

## 2020-01-06 MED ORDER — AMLODIPINE BESYLATE 2.5 MG PO TABS
2.5000 mg | ORAL_TABLET | Freq: Every day | ORAL | 3 refills | Status: DC
Start: 2020-01-06 — End: 2020-11-17

## 2020-01-06 NOTE — Telephone Encounter (Signed)
*  STAT* If patient is at the pharmacy, call can be transferred to refill team.   1. Which medications need to be refilled? (please list name of each medication and dose if known) amLODipine (NORVASC) 2.5 MG tablet  2. Which pharmacy/location (including street and city if local pharmacy) is medication to be sent to? [facility]   3. Do they need a 30 day or 90 day supply? 90 day supply

## 2020-01-10 ENCOUNTER — Other Ambulatory Visit: Payer: Self-pay | Admitting: Cardiovascular Disease

## 2020-01-25 DIAGNOSIS — M65341 Trigger finger, right ring finger: Secondary | ICD-10-CM | POA: Diagnosis not present

## 2020-01-25 DIAGNOSIS — M79641 Pain in right hand: Secondary | ICD-10-CM | POA: Diagnosis not present

## 2020-01-25 DIAGNOSIS — M79642 Pain in left hand: Secondary | ICD-10-CM | POA: Diagnosis not present

## 2020-01-25 DIAGNOSIS — M20012 Mallet finger of left finger(s): Secondary | ICD-10-CM | POA: Diagnosis not present

## 2020-02-11 ENCOUNTER — Encounter: Payer: Self-pay | Admitting: Cardiovascular Disease

## 2020-02-11 ENCOUNTER — Other Ambulatory Visit: Payer: Self-pay

## 2020-02-11 ENCOUNTER — Ambulatory Visit: Payer: Medicare Other | Admitting: Cardiovascular Disease

## 2020-02-11 DIAGNOSIS — R42 Dizziness and giddiness: Secondary | ICD-10-CM

## 2020-02-11 DIAGNOSIS — I480 Paroxysmal atrial fibrillation: Secondary | ICD-10-CM

## 2020-02-11 DIAGNOSIS — I10 Essential (primary) hypertension: Secondary | ICD-10-CM

## 2020-02-11 DIAGNOSIS — E782 Mixed hyperlipidemia: Secondary | ICD-10-CM | POA: Diagnosis not present

## 2020-02-11 NOTE — Assessment & Plan Note (Signed)
History of essential hypertension a blood pressure measured today at 118/72.  She is on amlodipine and hydrochlorothiazide.

## 2020-02-11 NOTE — Patient Instructions (Signed)
Medication Instructions:  No changes *If you need a refill on your cardiac medications before your next appointment, please call your pharmacy*   Lab Work: None ordered If you have labs (blood work) drawn today and your tests are completely normal, you will receive your results only by: . MyChart Message (if you have MyChart) OR . A paper copy in the mail If you have any lab test that is abnormal or we need to change your treatment, we will call you to review the results.   Testing/Procedures: None ordered   Follow-Up: At [facility], you and your health needs are our priority.  As part of our continuing mission to provide you with exceptional heart care, we have created designated Provider Care Teams.  These Care Teams include your primary Cardiologist (physician) and Advanced Practice Providers (APPs -  Physician Assistants and Nurse Practitioners) who all work together to provide you with the care you need, when you need it.  We recommend signing up for the patient portal called "MyChart".  Sign up information is provided on this After Visit Summary.  MyChart is used to connect with patients for Virtual Visits (Telemedicine).  Patients are able to view lab/test results, encounter notes, upcoming appointments, etc.  Non-urgent messages can be sent to your provider as well.   To learn more about what you can do with MyChart, go to [email or web address].    Your next appointment:   12 month(s)  The format for your next appointment:   In Person  Provider:   You may see Dr. Berry or one of the following Advanced Practice Providers on your designated Care Team:    Luke Kilroy, PA-C  Callie Goodrich, PA-C  Jesse Cleaver, FNP   

## 2020-02-11 NOTE — Assessment & Plan Note (Signed)
History of occasional dizziness in the past with bradycardia that improved with discontinuing of her beta-blocker.  Event monitor was not revealing.  Her dizziness has markedly improved over the last 1 to 2 years.

## 2020-02-11 NOTE — Assessment & Plan Note (Signed)
History of PAF with very rare episodes of A. fib not on oral anticoagulation

## 2020-02-11 NOTE — Assessment & Plan Note (Signed)
History of hyperlipidemia on statin therapy with lipid profile performed 08/30/2019 revealing total cholesterol of 166, LDL of 96 and HDL of 47

## 2020-02-11 NOTE — Progress Notes (Signed)
02/11/2020 Brittany Weaver   [birth year]  [id number]  Primary Physician Sharilyn Sites, MD Primary Cardiologist: Lorretta Harp MD [name], [place], [name]  HPI:  Brittany Weaver is a 79 y.o.   thin-appearing, married Caucasian female, mother of 3 who I last saw in the  01/29/2019.Marland Kitchen She has a strong family history of heart disease, as well as hypertension, hyperlipidemia and paroxysmal A-fib. She is a retired Loss adjuster, chart[employer]. Her last Myoview performed 3 years ago was nonischemic. She denies chest pain or shortness of breath. She does have occasional episodes of breakthrough A-fib with some dizziness but no presyncope. We will discuss oral anticoagulation which she preferred not to be on which I can't disagree with given her relative infrequency of breakthrough episodes.  She was recently admitted to [facility] with chest pain and hypertension on 05/24/16 at discharge the next day. She ruled out for myocardial infarction. She was bradycardic and her metoprolol was discontinued. 2-D echo was normal. She is under a lot of stress at home raising 2 grandchildren.Her chest pain has since resolved since I saw her in the office 2 weeks ago. A pharmacologic Myoview stress test performed 06/06/16 was nonischemic.  Since I saw her a year ago she continues to do well.  Her dizzy episodes are still fairly rare and improved since they have been in the past.  She did get Covid vaccine 05/19/2019 and again 06/01/2019.  She had a breakthrough case of COVID-19 12/16/2019 and did have an effusion of antibodies 2 days later.  Presenting symptoms were headache fatigue and cough.  These have resolved.  She has no further symptoms.    Current Meds  Medication Sig  . ALPRAZolam (XANAX) 0.25 MG tablet Take 0.25 mg by mouth 3 (three) times daily as needed.  Marland Kitchen amLODipine (NORVASC) 2.5 MG tablet Take 1 tablet (2.5 mg total) by mouth daily.  . Ascorbic Acid (VITAMIN C) 1000 MG tablet Take 1,000 mg by mouth  daily.  Marland Kitchen aspirin 81 MG tablet Take 81 mg by mouth daily.   Marland Kitchen BIOTIN PO Take by mouth.  . Calcium Carb-Cholecalciferol (CALCIUM 1000 + D PO) Take by mouth.  . calcium-vitamin D (OSCAL WITH D) 500-200 MG-UNIT tablet Take 1 tablet by mouth.  . hydrochlorothiazide (HYDRODIURIL) 25 MG tablet Take 25 mg by mouth daily.  Marland Kitchen lovastatin (MEVACOR) 20 MG tablet TAKE 1 TABLET BY MOUTH ONCE DAILY AT 6:00 PM  . Magnesium 400 MG CAPS Take 400 mg by mouth daily.   . potassium chloride (KLOR-CON) 10 MEQ tablet Take 10 mEq by mouth 2 (two) times daily.  . vitamin E 400 UNIT capsule Take 400 Units by mouth daily.  . [DISCONTINUED] methylPREDNISolone (MEDROL DOSEPAK) 4 MG TBPK tablet Take by mouth.     Allergies  Allergen Reactions  . Penicillins Hives    Has patient had a PCN reaction causing immediate rash, facial/tongue/throat swelling, SOB or lightheadedness with hypotension: unknown Has patient had a PCN reaction causing severe rash involving mucus membranes or skin necrosis: unknown Has patient had a PCN reaction that required hospitalization: no Has patient had a PCN reaction occurring within the last 10 years: no If all of the above answers are "NO", then may proceed with Cephalosporin use.   . Prednisone Other (See Comments)    DIZZINESS and swelling    Social History   Socioeconomic History  . Marital status: Married    Spouse name: Not on file  . Number  of children: Not on file  . Years of education: Not on file  . Highest education level: Not on file  Occupational History  . Not on file  Tobacco Use  . Smoking status: Never Smoker  . Smokeless tobacco: Never Used  Vaping Use  . Vaping Use: Never used  Substance and Sexual Activity  . Alcohol use: Yes    Alcohol/week: 0.0 standard drinks    Comment: RARELY  . Drug use: No  . Sexual activity: Yes    Birth control/protection: Post-menopausal    Comment: 1st intercourse 38 yo-1 partner  Other Topics Concern  . Not on file    Social History Narrative  . Not on file   Social Determinants of Health   Financial Resource Strain:   . Difficulty of Paying Living Expenses: Not on file  Food Insecurity:   . Worried About Charity fundraiser in the Last Year: Not on file  . Ran Out of Food in the Last Year: Not on file  Transportation Needs:   . Lack of Transportation (Medical): Not on file  . Lack of Transportation (Non-Medical): Not on file  Physical Activity:   . Days of Exercise per Week: Not on file  . Minutes of Exercise per Session: Not on file  Stress:   . Feeling of Stress : Not on file  Social Connections:   . Frequency of Communication with Friends and Family: Not on file  . Frequency of Social Gatherings with Friends and Family: Not on file  . Attends Religious Services: Not on file  . Active Member of Clubs or Organizations: Not on file  . Attends Archivist Meetings: Not on file  . Marital Status: Not on file  Intimate Partner Violence:   . Fear of Current or Ex-Partner: Not on file  . Emotionally Abused: Not on file  . Physically Abused: Not on file  . Sexually Abused: Not on file     Review of Systems: General: negative for chills, fever, night sweats or weight changes.  Cardiovascular: negative for chest pain, dyspnea on exertion, edema, orthopnea, palpitations, paroxysmal nocturnal dyspnea or shortness of breath Dermatological: negative for rash Respiratory: negative for cough or wheezing Urologic: negative for hematuria Abdominal: negative for nausea, vomiting, diarrhea, bright red blood per rectum, melena, or hematemesis Neurologic: negative for visual changes, syncope, or dizziness All other systems reviewed and are otherwise negative except as noted above.    Blood pressure 118/72, pulse 71, height 5' 5.5" (1.664 m), weight 160 lb (72.6 kg).  General appearance: alert and no distress Neck: no adenopathy, no carotid bruit, no JVD, supple, symmetrical, trachea midline  and thyroid not enlarged, symmetric, no tenderness/mass/nodules Lungs: clear to auscultation bilaterally Heart: regular rate and rhythm, S1, S2 normal, no murmur, click, rub or gallop Extremities: extremities normal, atraumatic, no cyanosis or edema Pulses: 2+ and symmetric Skin: Skin color, texture, turgor normal. No rashes or lesions Neurologic: Alert and oriented X 3, normal strength and tone. Normal symmetric reflexes. Normal coordination and gait  EKG not performed today  ASSESSMENT AND PLAN:   Essential hypertension History of essential hypertension a blood pressure measured today at 118/72.  She is on amlodipine and hydrochlorothiazide.  Hyperlipidemia History of hyperlipidemia on statin therapy with lipid profile performed 08/30/2019 revealing total cholesterol of 166, LDL of 96 and HDL of 47  Paroxysmal atrial fibrillation (HCC) History of PAF with very rare episodes of A. fib not on oral anticoagulation  Dizziness History of occasional  dizziness in the past with bradycardia that improved with discontinuing of her beta-blocker.  Event monitor was not revealing.  Her dizziness has markedly improved over the last 1 to 2 years.      Lorretta Harp MD FACP,FACC,FAHA, [facility] 02/11/2020 9:35 AM

## 2020-04-04 ENCOUNTER — Other Ambulatory Visit: Payer: Self-pay | Admitting: Cardiovascular Disease

## 2020-04-04 ENCOUNTER — Encounter (INDEPENDENT_AMBULATORY_CARE_PROVIDER_SITE_OTHER): Payer: Medicare Other | Admitting: Ophthalmology

## 2020-04-04 ENCOUNTER — Other Ambulatory Visit: Payer: Self-pay

## 2020-04-04 DIAGNOSIS — H35342 Macular cyst, hole, or pseudohole, left eye: Secondary | ICD-10-CM | POA: Diagnosis not present

## 2020-04-04 DIAGNOSIS — H35033 Hypertensive retinopathy, bilateral: Secondary | ICD-10-CM

## 2020-04-04 DIAGNOSIS — H43811 Vitreous degeneration, right eye: Secondary | ICD-10-CM | POA: Diagnosis not present

## 2020-04-04 DIAGNOSIS — I10 Essential (primary) hypertension: Secondary | ICD-10-CM

## 2020-04-04 DIAGNOSIS — D3132 Benign neoplasm of left choroid: Secondary | ICD-10-CM

## 2020-06-06 IMAGING — DX DG HAND COMPLETE 3+V*L*
3 series · 3 of 3 positions shown · non-contrast
Comparison: None

CLINICAL DATA: Fall. Left hand pain.

EXAM:
LEFT HAND - COMPLETE 3+ VIEW

[hand pa]
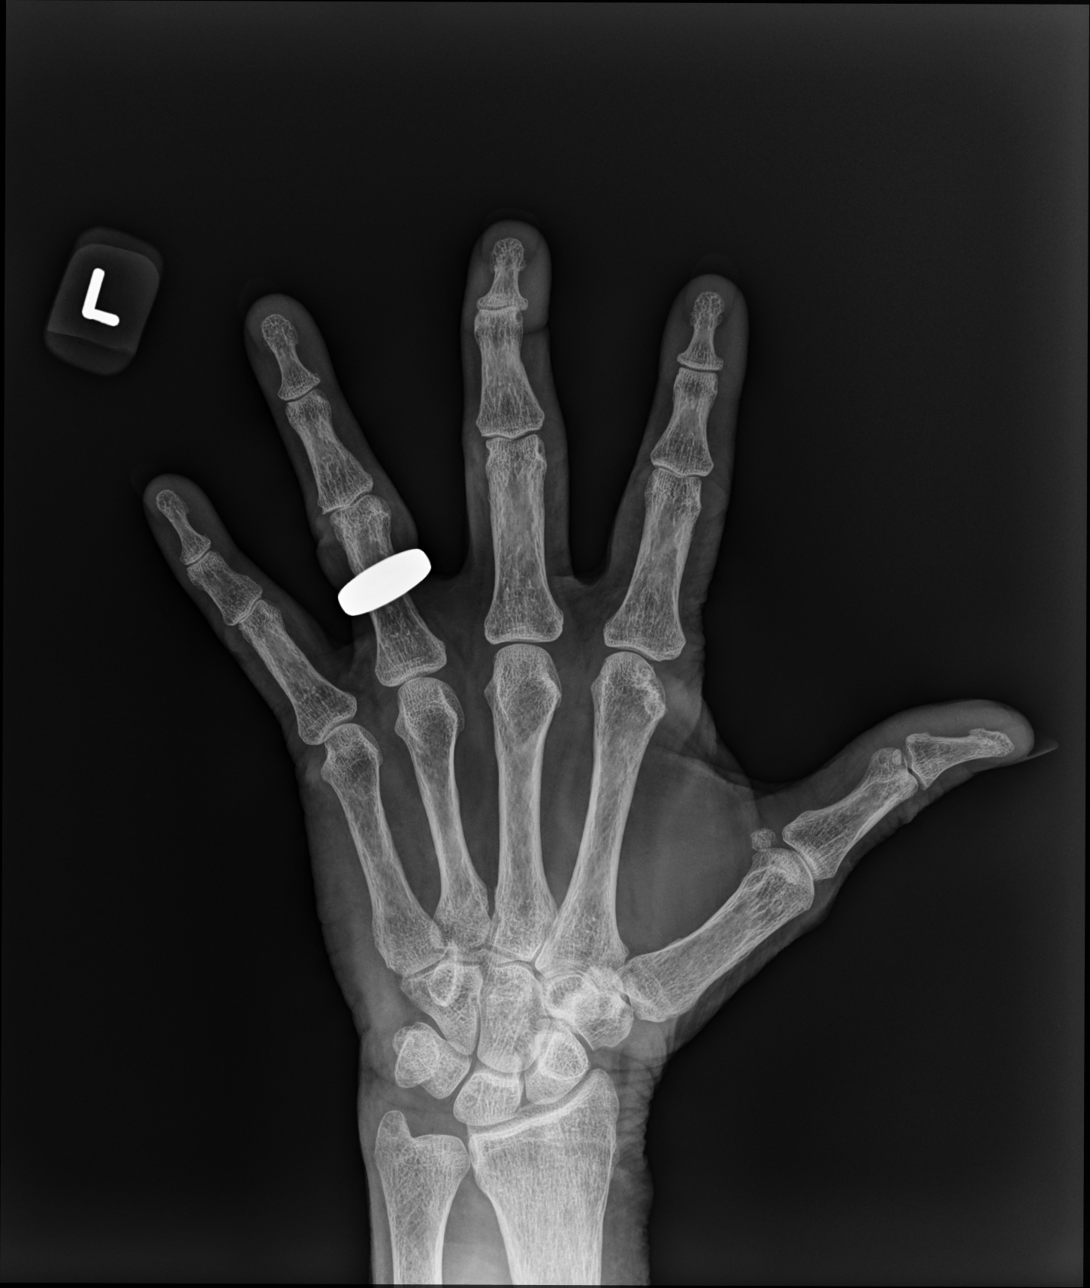

[hand mlo]
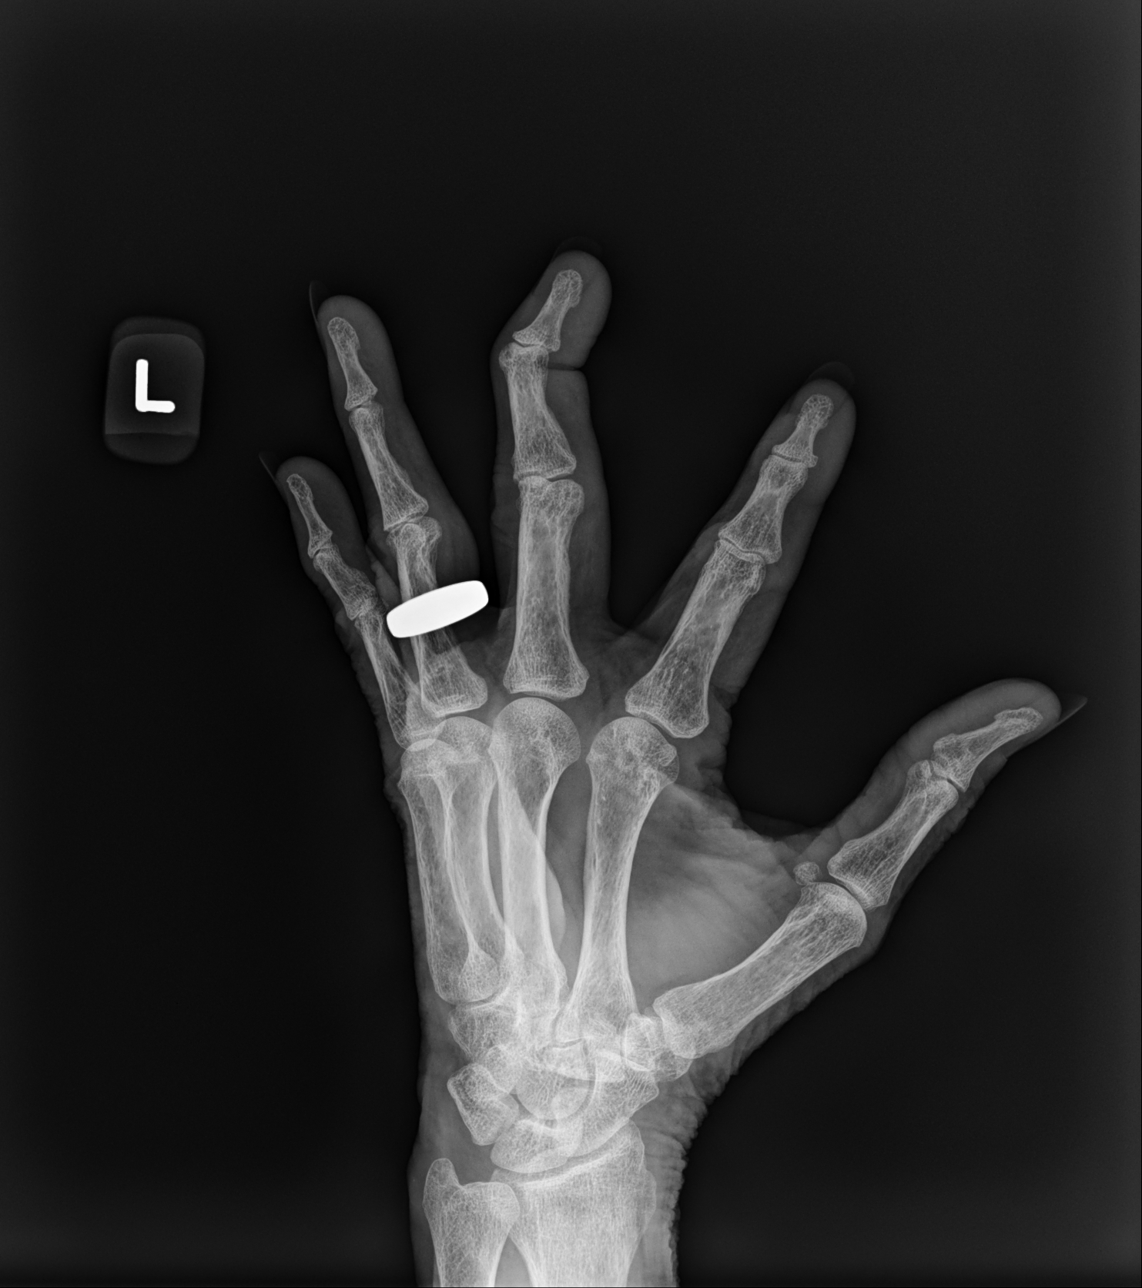

[hand lat]
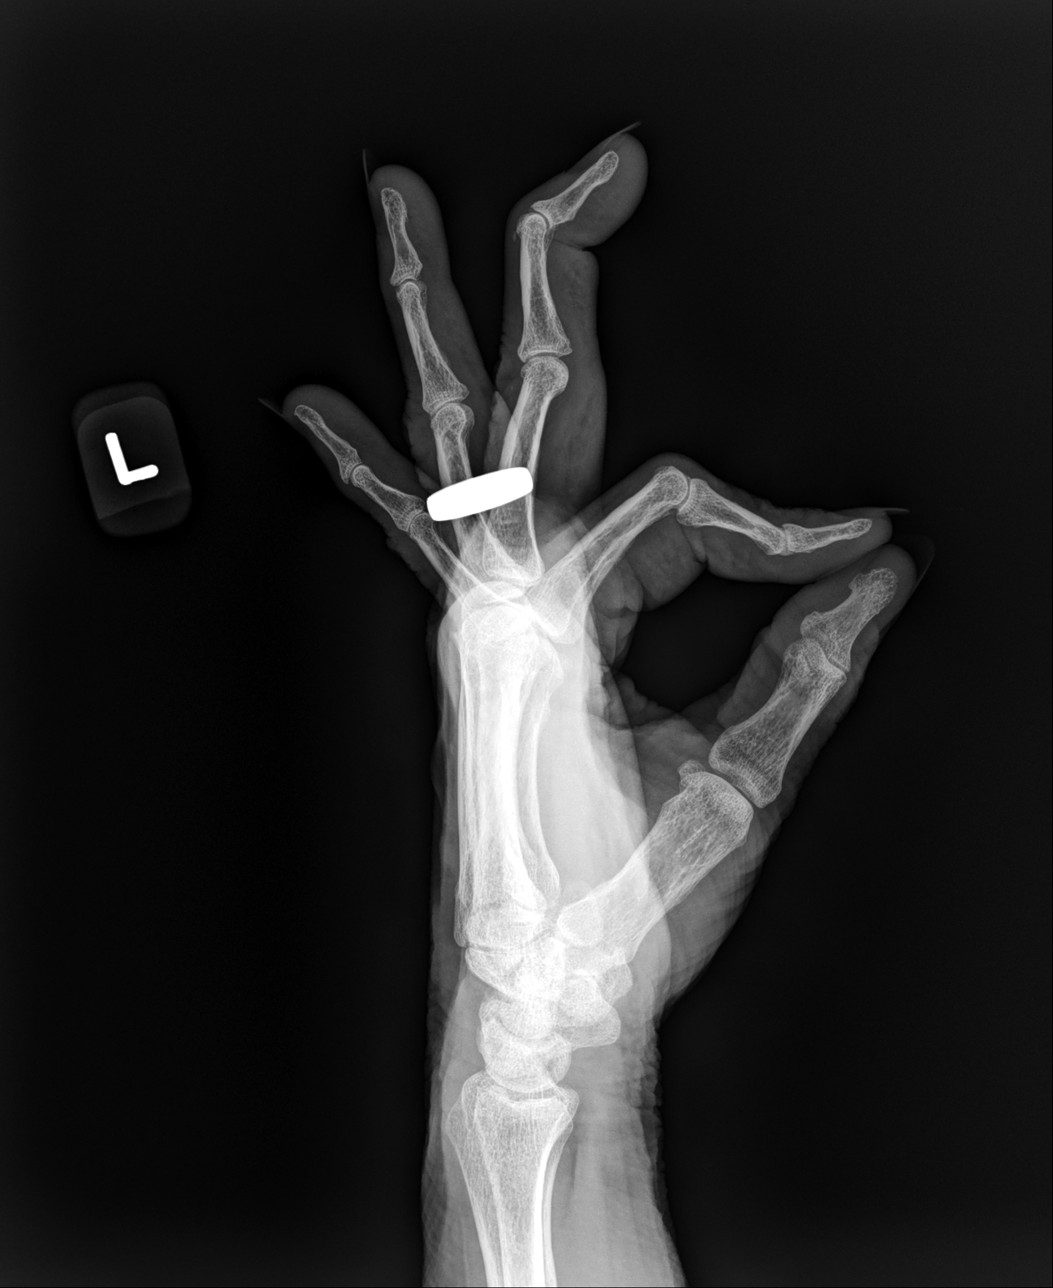

[3 of 3 positions shown; findings below may reference images not displayed]

FINDINGS: The bones appear diffusely osteopenic. No acute fracture or
dislocation identified. Flexion deformity at the third D IP joint
noted, likely chronic. Mild degenerative changes noted involving the
DIP joints.
IMPRESSION: 1. No acute findings.
2. Osteopenia.
3. Flexion deformity at the third DIP joint, likely chronic.

## 2020-07-24 DIAGNOSIS — L508 Other urticaria: Secondary | ICD-10-CM | POA: Diagnosis not present

## 2020-09-13 DIAGNOSIS — R7309 Other abnormal glucose: Secondary | ICD-10-CM | POA: Diagnosis not present

## 2020-09-13 DIAGNOSIS — L508 Other urticaria: Secondary | ICD-10-CM | POA: Diagnosis not present

## 2020-09-13 DIAGNOSIS — E7849 Other hyperlipidemia: Secondary | ICD-10-CM | POA: Diagnosis not present

## 2020-09-13 DIAGNOSIS — M1991 Primary osteoarthritis, unspecified site: Secondary | ICD-10-CM | POA: Diagnosis not present

## 2020-09-13 DIAGNOSIS — I4891 Unspecified atrial fibrillation: Secondary | ICD-10-CM | POA: Diagnosis not present

## 2020-09-13 DIAGNOSIS — Z1389 Encounter for screening for other disorder: Secondary | ICD-10-CM | POA: Diagnosis not present

## 2020-09-13 DIAGNOSIS — E782 Mixed hyperlipidemia: Secondary | ICD-10-CM | POA: Diagnosis not present

## 2020-09-13 DIAGNOSIS — Z0001 Encounter for general adult medical examination with abnormal findings: Secondary | ICD-10-CM | POA: Diagnosis not present

## 2020-09-13 DIAGNOSIS — I10 Essential (primary) hypertension: Secondary | ICD-10-CM | POA: Diagnosis not present

## 2020-11-16 ENCOUNTER — Other Ambulatory Visit: Payer: Self-pay | Admitting: Cardiovascular Disease

## 2020-12-18 ENCOUNTER — Other Ambulatory Visit: Payer: Self-pay | Admitting: Obstetrics and Gynecology

## 2020-12-18 DIAGNOSIS — Z1231 Encounter for screening mammogram for malignant neoplasm of breast: Secondary | ICD-10-CM

## 2021-01-09 ENCOUNTER — Ambulatory Visit
Admission: RE | Admit: 2021-01-09 | Discharge: 2021-01-09 | Disposition: A | Discharge status: Home / Self Care | Payer: Medicare Other | Source: Ambulatory Visit | Attending: Obstetrics and Gynecology | Admitting: Obstetrics and Gynecology

## 2021-01-09 ENCOUNTER — Other Ambulatory Visit: Payer: Self-pay

## 2021-01-09 DIAGNOSIS — Z1231 Encounter for screening mammogram for malignant neoplasm of breast: Secondary | ICD-10-CM

## 2021-01-16 ENCOUNTER — Other Ambulatory Visit: Payer: Self-pay | Admitting: Obstetrics and Gynecology

## 2021-01-16 DIAGNOSIS — R928 Other abnormal and inconclusive findings on diagnostic imaging of breast: Secondary | ICD-10-CM

## 2021-01-27 HISTORY — PX: BREAST BIOPSY: SHX20

## 2021-02-02 ENCOUNTER — Ambulatory Visit
Admission: EM | Admit: 2021-02-02 | Discharge: 2021-02-02 | Disposition: A | Discharge status: Home / Self Care | Payer: Medicare Other | Attending: Family Medicine | Admitting: Family Medicine

## 2021-02-02 ENCOUNTER — Encounter: Payer: Self-pay | Admitting: Emergency Medicine

## 2021-02-02 ENCOUNTER — Other Ambulatory Visit: Payer: Self-pay

## 2021-02-02 DIAGNOSIS — N390 Urinary tract infection, site not specified: Secondary | ICD-10-CM | POA: Diagnosis not present

## 2021-02-02 LAB — POCT URINALYSIS DIP (MANUAL ENTRY)
Bilirubin, UA: NEGATIVE
Blood, UA: NEGATIVE
Glucose, UA: NEGATIVE mg/dL
Ketones, POC UA: NEGATIVE mg/dL
Nitrite, UA: NEGATIVE
Protein Ur, POC: NEGATIVE mg/dL
Spec Grav, UA: 1.015 (ref 1.010–1.025)
Urobilinogen, UA: 0.2 E.U./dL
pH, UA: 7 (ref 5.0–8.0)

## 2021-02-02 MED ORDER — SULFAMETHOXAZOLE-TRIMETHOPRIM 800-160 MG PO TABS: 1.0000 | ORAL_TABLET | Freq: Two times a day (BID) | ORAL | 0 refills | Status: AC

## 2021-02-02 NOTE — ED Provider Notes (Signed)
[facility]    CSN: [id number] Arrival date & time: 02/02/21  0809      History   Chief Complaint No chief complaint on file.   HPI Brittany Weaver is a 80 y.o. female.   Patient presenting today with 2-day history of dysuria, urinary frequency, mild lower back aching bilaterally.  Denies fever, chills, nausea, vomiting, bowel changes, vaginal discharge or bleeding.  Taking Azo as needed with mild relief.  States she has had urinary tract infections before that it felt similar but has not had one in quite some time.  Thinks it may be related to restarting sodas into her diet.   Past Medical History:  Diagnosis Date   Chest pain    a. 05/2016 Myoview: EF 74%, no ischemia/infarct.   History of echocardiogram    a. 04/2016 Echo: EF 60-65%, no rwma.   Hypercholesterolemia    Hypertension    Macular hole of left eye    Mitral valve prolapse    no issues   Osteopenia 2013   T score -1.4 stable from prior DEXA 2007 no FRAX calculated   Paroxysmal atrial fibrillation (HCC)    a. CHA2DS2VASc = 4-->Xarelto.   Pelvic relaxation    Mild and asymptomatic.   PONV (postoperative nausea and vomiting)    "little bit of nausea" after cataract surgery    Patient Active Problem List   Diagnosis Date Noted   Dizziness 01/29/2019   Chest pain 05/24/2016   Sinus bradycardia 05/24/2016   Macular hole, left eye 11/22/2014   Rectocele 09/27/2014   Essential hypertension 06/01/2013   Hyperlipidemia 06/01/2013   Paroxysmal atrial fibrillation ([place]) 06/01/2013    Past Surgical History:  Procedure Laterality Date   25 GAUGE PARS PLANA VITRECTOMY WITH 20 GAUGE MVR PORT FOR MACULAR HOLE Left 11/22/2014   Procedure: 25 GAUGE PARS PLANA VITRECTOMY WITH 20 GAUGE MVR PORT FOR MACULAR HOLE LEFT EYE ;  Surgeon: Hayden Pedro, MD;  Location: [place];  Service: Ophthalmology;  Laterality: Left;   CATARACT EXTRACTION, BILATERAL     Dr Herbert Deaner   COLONOSCOPY     DILATION AND CURETTAGE OF  UTERUS     PT. DOES NOT REMEMBER THE YEAR   DOPPLER ECHOCARDIOGRAPHY  05/13/2007   EYE SURGERY Bilateral    cataract surgery with lens implant   GAS/FLUID EXCHANGE Left 11/22/2014   Procedure: GAS/FLUID EXCHANGE;  Surgeon: Hayden Pedro, MD;  Location: [place];  Service: Ophthalmology;  Laterality: Left;  C3F8   LASER PHOTO ABLATION Left 11/22/2014   Procedure: LASER PHOTO ABLATION;  Surgeon: Hayden Pedro, MD;  Location: [place];  Service: Ophthalmology;  Laterality: Left;  Headscope laser   MEMBRANE PEEL Left 11/22/2014   Procedure: MEMBRANE PEEL;  Surgeon: Hayden Pedro, MD;  Location: [place];  Service: Ophthalmology;  Laterality: Left;   myoview   06/05/2009   SERUM PATCH Left 11/22/2014   Procedure: SERUM PATCH;  Surgeon: Hayden Pedro, MD;  Location: [place];  Service: Ophthalmology;  Laterality: Left;    OB History     Gravida  3   Para  3   Term  3   Preterm      AB      Living  3      SAB      IAB      Ectopic      Multiple      Live Births  Home Medications    Prior to Admission medications   Medication Sig Start Date End Date Taking? Authorizing Provider  sulfamethoxazole-trimethoprim (BACTRIM DS) 800-160 MG tablet Take 1 tablet by mouth 2 (two) times daily for 3 days. 02/02/21 02/05/21 Yes Volney American, PA-C  ALPRAZolam Duanne Moron) 0.25 MG tablet Take 0.25 mg by mouth 3 (three) times daily as needed. 09/29/19   [provider]  amLODipine (NORVASC) 2.5 MG tablet TAKE 1 TABLET BY MOUTH  DAILY 11/17/20   Lorretta Harp, MD  Ascorbic Acid (VITAMIN C) 1000 MG tablet Take 1,000 mg by mouth daily.    [provider]  aspirin 81 MG tablet Take 81 mg by mouth daily.     [provider]  BIOTIN PO Take by mouth.    [provider]  Calcium Carb-Cholecalciferol (CALCIUM 1000 + D PO) Take by mouth.    [provider]  calcium-vitamin D (OSCAL WITH D) 500-200 MG-UNIT tablet Take 1 tablet by mouth.     [provider]  hydrochlorothiazide (HYDRODIURIL) 25 MG tablet Take 25 mg by mouth daily.    [provider]  lovastatin (MEVACOR) 20 MG tablet TAKE 1 TABLET BY MOUTH ONCE DAILY AT 6:00 PM 04/06/20   Lorretta Harp, MD  Magnesium 400 MG CAPS Take 400 mg by mouth daily.     [provider]  potassium chloride (KLOR-CON) 10 MEQ tablet Take 10 mEq by mouth 2 (two) times daily.    [provider]  vitamin E 400 UNIT capsule Take 400 Units by mouth daily.    [provider]    Family History Family History  Problem Relation Age of Onset   Stroke Mother    Heart disease Mother    Kidney disease Mother    Hypertension Mother    Hyperlipidemia Mother    Stroke Brother    Heart disease Brother    Stroke Brother    Heart disease Brother    Heart failure Sister    Cancer Sister        Esophagus/stomach   Stroke Sister    Heart disease Sister    Heart disease Father    Colon cancer Neg Hx    Rectal cancer Neg Hx     Social History Social History   Tobacco Use   Smoking status: Never   Smokeless tobacco: Never  Vaping Use   Vaping Use: Never used  Substance Use Topics   Alcohol use: Yes    Alcohol/week: 0.0 standard drinks    Comment: RARELY   Drug use: No     Allergies   Penicillins and Prednisone   Review of Systems Review of Systems Per HPI  Physical Exam Triage Vital Signs ED Triage Vitals  Enc Vitals Group     BP 02/02/21 0819 (!) 176/79     Pulse Rate 02/02/21 0819 76     Resp 02/02/21 0819 18     Temp 02/02/21 0819 (!) 97.5 F (36.4 C)     Temp Source 02/02/21 0819 Oral     SpO2 02/02/21 0819 96 %     Weight --      Height --      Head Circumference --      Peak Flow --      Pain Score 02/02/21 0821 2     Pain Loc --      Pain Edu? --      Excl. in [place]? --    No data  found.  Updated Vital Signs BP (!) 176/79 (BP Location: Right Arm)   Pulse 76   Temp (!) 97.5 F (36.4 C) (Oral)   Resp 18   SpO2  96%   Visual Acuity Right Eye Distance:   Left Eye Distance:   Bilateral Distance:    Right Eye Near:   Left Eye Near:    Bilateral Near:     Physical Exam Vitals and nursing note reviewed.  Constitutional:      Appearance: Normal appearance. She is not ill-appearing.  HENT:     Head: Atraumatic.     Mouth/Throat:     Mouth: Mucous membranes are moist.  Eyes:     Extraocular Movements: Extraocular movements intact.     Conjunctiva/sclera: Conjunctivae normal.  Cardiovascular:     Rate and Rhythm: Normal rate and regular rhythm.     Heart sounds: Normal heart sounds.  Pulmonary:     Effort: Pulmonary effort is normal.     Breath sounds: Normal breath sounds.  Abdominal:     General: Bowel sounds are normal. There is no distension.     Palpations: Abdomen is soft.     Tenderness: There is no abdominal tenderness. There is no right CVA tenderness, left CVA tenderness or guarding.  Musculoskeletal:        General: Normal range of motion.     Cervical back: Normal range of motion and neck supple.  Skin:    General: Skin is warm and dry.  Neurological:     Mental Status: She is alert and oriented to person, place, and time.  Psychiatric:        Mood and Affect: Mood normal.        Thought Content: Thought content normal.        Judgment: Judgment normal.     UC Treatments / Results  Labs (all labs ordered are listed, but only abnormal results are displayed) Labs Reviewed  POCT URINALYSIS DIP (MANUAL ENTRY) - Abnormal; Notable for the following components:      Result Value   Leukocytes, UA Small (1+) (*)    All other components within normal limits  URINE CULTURE    EKG   Radiology No results found.  Procedures Procedures (including critical care time)  Medications Ordered in UC Medications - No data to display  Initial Impression / Assessment and Plan / UC Course  I have reviewed the triage vital signs and the nursing notes.  Pertinent labs & imaging  results that were available during my care of the patient were reviewed by me and considered in my medical decision making (see chart for details).     Mildly hypertensive in triage but otherwise vital signs reassuring, exam benign today.  UA showing leukocytes, urine culture pending but given her symptoms we will treat with Bactrim while awaiting urine culture results.  Adjust as needed based on results.  Discussed supportive home care and return precautions.   Final Clinical Impressions(s) / UC Diagnoses   Final diagnoses:  Acute lower UTI   Discharge Instructions   None    ED Prescriptions     Medication Sig Dispense Auth. Provider   sulfamethoxazole-trimethoprim (BACTRIM DS) 800-160 MG tablet Take 1 tablet by mouth 2 (two) times daily for 3 days. 6 tablet Volney American, [place]      PDMP not reviewed this encounter.   Merrie Ro[place], [place] 02/02/21 [id number]

## 2021-02-02 NOTE — ED Triage Notes (Signed)
Lower back pain, some burning on urination since Saturday has been taking AZO

## 2021-02-04 LAB — URINE CULTURE: Culture: NO GROWTH

## 2021-02-07 DIAGNOSIS — M1991 Primary osteoarthritis, unspecified site: Secondary | ICD-10-CM | POA: Diagnosis not present

## 2021-02-07 DIAGNOSIS — M545 Low back pain, unspecified: Secondary | ICD-10-CM | POA: Diagnosis not present

## 2021-02-07 DIAGNOSIS — Z23 Encounter for immunization: Secondary | ICD-10-CM | POA: Diagnosis not present

## 2021-02-10 ENCOUNTER — Other Ambulatory Visit: Payer: Self-pay | Admitting: Obstetrics and Gynecology

## 2021-02-10 ENCOUNTER — Ambulatory Visit
Admission: RE | Admit: 2021-02-10 | Discharge: 2021-02-10 | Disposition: A | Discharge status: Home / Self Care | Payer: Medicare Other | Source: Ambulatory Visit | Attending: Obstetrics and Gynecology | Admitting: Obstetrics and Gynecology

## 2021-02-10 DIAGNOSIS — R921 Mammographic calcification found on diagnostic imaging of breast: Secondary | ICD-10-CM

## 2021-02-10 DIAGNOSIS — R928 Other abnormal and inconclusive findings on diagnostic imaging of breast: Secondary | ICD-10-CM

## 2021-02-10 DIAGNOSIS — R922 Inconclusive mammogram: Secondary | ICD-10-CM | POA: Diagnosis not present

## 2021-02-21 ENCOUNTER — Other Ambulatory Visit: Payer: Self-pay

## 2021-02-21 ENCOUNTER — Other Ambulatory Visit: Payer: Self-pay | Admitting: Obstetrics and Gynecology

## 2021-02-21 ENCOUNTER — Ambulatory Visit
Admission: RE | Admit: 2021-02-21 | Discharge: 2021-02-21 | Disposition: A | Discharge status: Home / Self Care | Payer: Medicare Other | Source: Ambulatory Visit | Attending: Obstetrics and Gynecology | Admitting: Obstetrics and Gynecology

## 2021-02-21 DIAGNOSIS — R921 Mammographic calcification found on diagnostic imaging of breast: Secondary | ICD-10-CM

## 2021-02-21 DIAGNOSIS — N6011 Diffuse cystic mastopathy of right breast: Secondary | ICD-10-CM | POA: Diagnosis not present

## 2021-03-07 ENCOUNTER — Other Ambulatory Visit: Payer: Self-pay

## 2021-03-07 ENCOUNTER — Other Ambulatory Visit: Payer: Self-pay | Admitting: Cardiovascular Disease

## 2021-03-07 ENCOUNTER — Encounter: Payer: Self-pay | Admitting: Obstetrics and Gynecology

## 2021-03-07 ENCOUNTER — Ambulatory Visit (INDEPENDENT_AMBULATORY_CARE_PROVIDER_SITE_OTHER): Payer: Medicare Other | Admitting: Obstetrics and Gynecology

## 2021-03-07 VITALS — BP 140/68 | HR 76 | Ht 64.5 in | Wt 166.0 lb

## 2021-03-07 DIAGNOSIS — M8580 Other specified disorders of bone density and structure, unspecified site: Secondary | ICD-10-CM

## 2021-03-07 DIAGNOSIS — Z01419 Encounter for gynecological examination (general) (routine) without abnormal findings: Secondary | ICD-10-CM | POA: Diagnosis not present

## 2021-03-07 DIAGNOSIS — Z780 Asymptomatic menopausal state: Secondary | ICD-10-CM

## 2021-03-07 NOTE — Progress Notes (Signed)
80 y.o. G55P3003 Married Caucasian female here for annual exam.    She is followed for pelvic organ prolapse.  She has urinary urgency.  No urinary incontinence unless she has urgency and is far from bathroom.   No bowel concerns.  She had some lower abdominal discomfort with an increase in activity.  Symptoms are now resolved.  She has osteopenia. Increased risk of fracture by FRAX.  PCP: Sharilyn Sites, MD  No LMP recorded. Patient is postmenopausal.           Sexually active: Yes.    The current method of family planning is post menopausal status.    Exercising: Yes.     housework Smoker:  no  Health Maintenance: Pap: 09-27-14 Neg History of abnormal Pap:  no MMG:  01-09-21 SEE Epic.  Biopsy right breast showing fibroadenomatoid nodule with calcifications and hyperplasia.  Next mammogram in Sept, 2023.  Colonoscopy:  06-04-13 NORMAL;no further procedure needed BMD:   02-22-19  Result  :Osteopenia of hip and forearm. Increased risk of fracture by FRAX.  TDaP:  PCP Gardasil:   no HIV:no Hep C:no Screening Labs:  PCP Flu vaccine:  completed.  Covid bivalent:  completed.    reports that she has never smoked. She has never used smokeless tobacco. She reports current alcohol use. She reports that she does not use drugs.  Past Medical History:  Diagnosis Date   Chest pain    a. 05/2016 Myoview: EF 74%, no ischemia/infarct.   History of COVID-19 11/28/2019   History of echocardiogram    a. 04/2016 Echo: EF 60-65%, no rwma.   Hypercholesterolemia    Hypertension    Macular hole of left eye    Mitral valve prolapse    no issues   Osteopenia 2013   T score -1.4 stable from prior DEXA 2007 no FRAX calculated   Paroxysmal atrial fibrillation (HCC)    a. CHA2DS2VASc = 4-->Xarelto.   Pelvic relaxation    Mild and asymptomatic.   PONV (postoperative nausea and vomiting)    "little bit of nausea" after cataract surgery    Past Surgical History:  Procedure Laterality Date   25  GAUGE PARS PLANA VITRECTOMY WITH 20 GAUGE MVR PORT FOR MACULAR HOLE Left 11/22/2014   Procedure: 25 GAUGE PARS PLANA VITRECTOMY WITH 20 GAUGE MVR PORT FOR MACULAR HOLE LEFT EYE ;  Surgeon: Hayden Pedro, MD;  Location: [place];  Service: Ophthalmology;  Laterality: Left;   CATARACT EXTRACTION, BILATERAL     Dr Herbert Deaner   COLONOSCOPY     DILATION AND CURETTAGE OF UTERUS     PT. DOES NOT REMEMBER THE YEAR   DOPPLER ECHOCARDIOGRAPHY  05/13/2007   EYE SURGERY Bilateral    cataract surgery with lens implant   GAS/FLUID EXCHANGE Left 11/22/2014   Procedure: GAS/FLUID EXCHANGE;  Surgeon: Hayden Pedro, MD;  Location: [place];  Service: Ophthalmology;  Laterality: Left;  C3F8   LASER PHOTO ABLATION Left 11/22/2014   Procedure: LASER PHOTO ABLATION;  Surgeon: Hayden Pedro, MD;  Location: [place];  Service: Ophthalmology;  Laterality: Left;  Headscope laser   MEMBRANE PEEL Left 11/22/2014   Procedure: MEMBRANE PEEL;  Surgeon: Hayden Pedro, MD;  Location: [place];  Service: Ophthalmology;  Laterality: Left;   myoview   06/05/2009   SERUM PATCH Left 11/22/2014   Procedure: SERUM PATCH;  Surgeon: Hayden Pedro, MD;  Location: [place];  Service: Ophthalmology;  Laterality: Left;    Current Outpatient Medications  Medication Sig Dispense Refill   ALPRAZolam (XANAX) 0.25 MG tablet Take 0.25 mg by mouth 3 (three) times daily as needed.     amLODipine (NORVASC) 2.5 MG tablet TAKE 1 TABLET BY MOUTH  DAILY 90 tablet 3   Ascorbic Acid (VITAMIN C) 1000 MG tablet Take 1,000 mg by mouth daily.     aspirin 81 MG tablet Take 81 mg by mouth daily.      BIOTIN PO Take by mouth.     Calcium Carb-Cholecalciferol (CALCIUM 1000 + D PO) Take by mouth.     celecoxib (CELEBREX) 200 MG capsule Take 200 mg by mouth 2 (two) times daily as needed.     co-enzyme Q-10 30 MG capsule Take 30 mg by mouth daily.     cyclobenzaprine (FLEXERIL) 10 MG tablet Take 10 mg by mouth daily as needed.     hydrochlorothiazide (HYDRODIURIL) 25 MG  tablet Take 25 mg by mouth daily.     lovastatin (MEVACOR) 20 MG tablet TAKE 1 TABLET BY MOUTH ONCE DAILY AT 6:00 PM 90 tablet 3   Magnesium 400 MG CAPS Take 400 mg by mouth daily.      potassium chloride (KLOR-CON) 10 MEQ tablet Take 10 mEq by mouth 2 (two) times daily.     vitamin E 400 UNIT capsule Take 400 Units by mouth daily.     No current facility-administered medications for this visit.    Family History  Problem Relation Age of Onset   Stroke Mother    Heart disease Mother    Kidney disease Mother    Hypertension Mother    Hyperlipidemia Mother    Stroke Brother    Heart disease Brother    Stroke Brother    Heart disease Brother    Heart failure Sister    Cancer Sister        Esophagus/stomach   Stroke Sister    Heart disease Sister    Heart disease Father    Colon cancer Neg Hx    Rectal cancer Neg Hx     Review of Systems  Genitourinary:  Positive for pelvic pain (bilateral low pelvic discomfort).  All other systems reviewed and are negative.  Exam:   BP 140/68   Pulse 76   Ht 5' 4.5" (1.638 m)   Wt 166 lb (75.3 kg)   SpO2 100%   BMI 28.05 kg/m     General appearance: alert, cooperative and appears stated age Head: normocephalic, without obvious abnormality, atraumatic Neck: no adenopathy, supple, symmetrical, trachea midline and thyroid normal to inspection and palpation Lungs: clear to auscultation bilaterally Breasts: normal appearance, no masses or tenderness, No nipple retraction or dimpling, No nipple discharge or bleeding, No axillary adenopathy Heart: regular rate and rhythm Abdomen: soft, non-tender; no masses, no organomegaly Extremities: extremities normal, atraumatic, no cyanosis or edema Skin: skin color, texture, turgor normal. No rashes or lesions Lymph nodes: cervical, supraclavicular, and axillary nodes normal. Neurologic: grossly normal  Pelvic: External genitalia:  no lesions              No abnormal inguinal nodes palpated.               Urethra:  normal appearing urethra with no masses, tenderness or lesions              Bartholins and Skenes: normal                 Vagina: normal appearing vagina with normal color and discharge, no  lesions              Cervix: no lesions              Pap taken: no Bimanual Exam:  Uterus:  normal size, contour, position, consistency, mobility, non-tender              Adnexa: no mass, fullness, tenderness              Rectal exam: yes.  Confirms.              Anus:  normal sphincter tone, no lesions  Chaperone was present for exam:  yes.  Assessment:   Well woman visit with gynecologic exam. Menopausal female. Status post benign right breast biopsy.  Osteopenia.  Urinary urgency.  Hx atrial fibrillation.   Plan: Mammogram screening discussed. Self breast awareness reviewed. Pap declined based on age and current guidelines. Guidelines for Calcium, Vitamin D, regular exercise program including cardiovascular and weight bearing exercise. BMD ordered.  Reduce bladder irritants. Labs with PCP.  Follow up annually and prn.   After visit summary provided.   21 min  total time was spent for this patient encounter, including preparation, face-to-face counseling with the patient, coordination of care, and documentation of the encounter.

## 2021-03-07 NOTE — Patient Instructions (Signed)

## 2021-03-14 ENCOUNTER — Other Ambulatory Visit: Payer: Self-pay

## 2021-03-14 ENCOUNTER — Ambulatory Visit: Payer: Medicare Other | Admitting: Cardiovascular Disease

## 2021-03-14 ENCOUNTER — Encounter: Payer: Self-pay | Admitting: Cardiovascular Disease

## 2021-03-14 VITALS — BP 130/68 | HR 60 | Ht 65.0 in | Wt 166.0 lb

## 2021-03-14 DIAGNOSIS — I10 Essential (primary) hypertension: Secondary | ICD-10-CM

## 2021-03-14 DIAGNOSIS — I480 Paroxysmal atrial fibrillation: Secondary | ICD-10-CM | POA: Diagnosis not present

## 2021-03-14 DIAGNOSIS — R42 Dizziness and giddiness: Secondary | ICD-10-CM | POA: Diagnosis not present

## 2021-03-14 DIAGNOSIS — E782 Mixed hyperlipidemia: Secondary | ICD-10-CM | POA: Diagnosis not present

## 2021-03-14 DIAGNOSIS — I208 Other forms of angina pectoris: Secondary | ICD-10-CM | POA: Diagnosis not present

## 2021-03-14 DIAGNOSIS — I2089 Other forms of angina pectoris: Secondary | ICD-10-CM

## 2021-03-14 NOTE — Assessment & Plan Note (Signed)
History of atypical chest and back pain in the past with a negative Myoview performed in 2018.  She still gets occasional back pain which is relieved with lying down.

## 2021-03-14 NOTE — Progress Notes (Signed)
03/14/2021 Brittany Weaver   [birth year]  [id number]  Primary Physician Sharilyn Sites, MD Primary Cardiologist: Lorretta Harp MD [name], [place], [name]  HPI:  Brittany Weaver is a 80 y.o.    thin-appearing, married Caucasian female, mother of 3 who I last saw in the 02/11/2020.Marland Kitchen She has a strong family history of heart disease, as well as hypertension, hyperlipidemia and paroxysmal A-fib. She is a retired Loss adjuster, chart[employer]. Her last Myoview performed 3 years ago was nonischemic. She denies chest pain or shortness of breath. She does have occasional episodes of breakthrough A-fib with some dizziness but no presyncope. We will discuss oral anticoagulation which she preferred not to be on which I can't disagree with given her relative infrequency of breakthrough episodes.   She was recently admitted to [facility] with chest pain and hypertension on 05/24/16 at discharge the next day. She ruled out for myocardial infarction. She was bradycardic and her metoprolol was discontinued. 2-D echo was normal. She is under a lot of stress at home raising 2 grandchildren. Her chest pain has since resolved since I saw her in the office 2 weeks ago. A pharmacologic Myoview stress test performed 06/06/16 was nonischemic.   Since I saw her a year ago she continues to do well.  She was doing well without dizziness until recently when she had 4 episodes within the last week or 2.  These are very brief and she was not able to see any change in heart rate or rhythm on her "apple watch".  She is also complained of some back pain which resolves when lying down.  She is fairly active and just painted her two-story house and has done yard work without limitation.  Current Meds  Medication Sig   ALPRAZolam (XANAX) 0.25 MG tablet Take 0.25 mg by mouth 3 (three) times daily as needed.   amLODipine (NORVASC) 2.5 MG tablet TAKE 1 TABLET BY MOUTH  DAILY   Ascorbic Acid (VITAMIN C) 1000 MG tablet Take 1,000 mg  by mouth daily.   aspirin 81 MG tablet Take 81 mg by mouth daily.    BIOTIN PO Take by mouth.   Calcium Carb-Cholecalciferol (CALCIUM 1000 + D PO) Take by mouth.   celecoxib (CELEBREX) 200 MG capsule Take 200 mg by mouth 2 (two) times daily as needed.   co-enzyme Q-10 30 MG capsule Take 30 mg by mouth daily.   cyclobenzaprine (FLEXERIL) 10 MG tablet Take 10 mg by mouth daily as needed.   hydrochlorothiazide (HYDRODIURIL) 25 MG tablet Take 25 mg by mouth daily.   lovastatin (MEVACOR) 20 MG tablet TAKE 1 TABLET BY MOUTH ONCE DAILY AT 6:00 PM   Magnesium 400 MG CAPS Take 400 mg by mouth daily.    potassium chloride (KLOR-CON) 10 MEQ tablet Take 10 mEq by mouth 2 (two) times daily.   vitamin E 400 UNIT capsule Take 400 Units by mouth daily.     Allergies  Allergen Reactions   Penicillins Hives    Has patient had a PCN reaction causing immediate rash, facial/tongue/throat swelling, SOB or lightheadedness with hypotension: unknown Has patient had a PCN reaction causing severe rash involving mucus membranes or skin necrosis: unknown Has patient had a PCN reaction that required hospitalization: no Has patient had a PCN reaction occurring within the last 10 years: no If all of the above answers are "NO", then may proceed with Cephalosporin use.    Prednisone Other (See Comments)  DIZZINESS and swelling    Social History   Socioeconomic History   Marital status: Married    Spouse name: Not on file   Number of children: Not on file   Years of education: Not on file   Highest education level: Not on file  Occupational History   Not on file  Tobacco Use   Smoking status: Never   Smokeless tobacco: Never  Vaping Use   Vaping Use: Never used  Substance and Sexual Activity   Alcohol use: Yes    Comment: 2 drinks/month   Drug use: No   Sexual activity: Yes    Birth control/protection: Post-menopausal    Comment: 1st intercourse 54 yo-1 partner  Other Topics Concern   Not on file   Social History Narrative   Not on file   Social Determinants of Health   Financial Resource Strain: Not on file  Food Insecurity: Not on file  Transportation Needs: Not on file  Physical Activity: Not on file  Stress: Not on file  Social Connections: Not on file  Intimate Partner Violence: Not on file     Review of Systems: General: negative for chills, fever, night sweats or weight changes.  Cardiovascular: negative for chest pain, dyspnea on exertion, edema, orthopnea, palpitations, paroxysmal nocturnal dyspnea or shortness of breath Dermatological: negative for rash Respiratory: negative for cough or wheezing Urologic: negative for hematuria Abdominal: negative for nausea, vomiting, diarrhea, bright red blood per rectum, melena, or hematemesis Neurologic: negative for visual changes, syncope, or dizziness All other systems reviewed and are otherwise negative except as noted above.    Blood pressure 130/68, pulse 60, height 5\' 5"  (1.651 m), weight 166 lb (75.3 kg), SpO2 98 %.  General appearance: alert and no distress Neck: no adenopathy, no carotid bruit, no JVD, supple, symmetrical, trachea midline, and thyroid not enlarged, symmetric, no tenderness/mass/nodules Lungs: clear to auscultation bilaterally Heart: regular rate and rhythm, S1, S2 normal, no murmur, click, rub or gallop Extremities: extremities normal, atraumatic, no cyanosis or edema Pulses: 2+ and symmetric Skin: Skin color, texture, turgor normal. No rashes or lesions Neurologic: Grossly normal  EKG sinus rhythm at 60 without ST or T wave changes.  I personally reviewed this EKG.  ASSESSMENT AND PLAN:   Essential hypertension History of essential hypertension a blood pressure measured today 130/68.  She is on amlodipine, and hydrochlorothiazide.  Hyperlipidemia History of hyperlipidemia on statin therapy with lipid profile performed 09/13/2020 revealing total cholesterol 168, LDL 84 and HDL 59.  Chest  pain History of atypical chest and back pain in the past with a negative Myoview performed in 2018.  She still gets occasional back pain which is relieved with lying down.  Dizziness History of occasional dizzy episodes with monitoring performed back in 2018 that did not show any arrhythmias that would be responsible.     Lorretta Harp MD FACP,FACC,FAHA, [facility] 03/14/2021 9:47 AM

## 2021-03-14 NOTE — Patient Instructions (Signed)
Medication Instructions:  ?Your physician recommends that you continue on your current medications as directed. Please refer to the Current Medication list given to you today. ? ?*If you need a refill on your cardiac medications before your next appointment, please call your pharmacy* ? ? ?Follow-Up: ?At [facility], you and your health needs are our priority.  As part of our continuing mission to provide you with exceptional heart care, we have created designated Provider Care Teams.  These Care Teams include your primary Cardiologist (physician) and Advanced Practice Providers (APPs -  Physician Assistants and Nurse Practitioners) who all work together to provide you with the care you need, when you need it. ? ?We recommend signing up for the patient portal called "MyChart".  Sign up information is provided on this After Visit Summary.  MyChart is used to connect with patients for Virtual Visits (Telemedicine).  Patients are able to view lab/test results, encounter notes, upcoming appointments, etc.  Non-urgent messages can be sent to your provider as well.   ?To learn more about what you can do with MyChart, go to [email or web address].   ? ?Your next appointment:   ?6 month(s) ? ?The format for your next appointment:   ?In Person ? ?Provider:   ?Jesse Cleaver, FNP, Angela Duke, PA-C, Callie Goodrich, PA-C, Jennifer, Lambert, PA-C, Kathryn Lawrence, DNP, ANP, or Hao Meng, PA-C     ? ? ?Then, Jonathan Berry, MD will plan to see you again in 12 month(s).  ?

## 2021-03-14 NOTE — Assessment & Plan Note (Signed)
History of occasional dizzy episodes with monitoring performed back in 2018 that did not show any arrhythmias that would be responsible.

## 2021-03-14 NOTE — Assessment & Plan Note (Signed)
History of hyperlipidemia on statin therapy with lipid profile performed 09/13/2020 revealing total cholesterol 168, LDL 84 and HDL 59.

## 2021-03-14 NOTE — Assessment & Plan Note (Signed)
History of essential hypertension a blood pressure measured today 130/68.  She is on amlodipine, and hydrochlorothiazide.

## 2021-03-16 DIAGNOSIS — H524 Presbyopia: Secondary | ICD-10-CM | POA: Diagnosis not present

## 2021-03-16 DIAGNOSIS — H26493 Other secondary cataract, bilateral: Secondary | ICD-10-CM | POA: Diagnosis not present

## 2021-03-16 DIAGNOSIS — D23122 Other benign neoplasm of skin of left lower eyelid, including canthus: Secondary | ICD-10-CM | POA: Diagnosis not present

## 2021-03-16 DIAGNOSIS — H04123 Dry eye syndrome of bilateral lacrimal glands: Secondary | ICD-10-CM | POA: Diagnosis not present

## 2021-03-16 DIAGNOSIS — H40013 Open angle with borderline findings, low risk, bilateral: Secondary | ICD-10-CM | POA: Diagnosis not present

## 2021-03-30 ENCOUNTER — Other Ambulatory Visit: Payer: Self-pay

## 2021-03-30 ENCOUNTER — Ambulatory Visit (HOSPITAL_COMMUNITY)
Admission: RE | Admit: 2021-03-30 | Discharge: 2021-03-30 | Disposition: A | Discharge status: Home / Self Care | Payer: Medicare Other | Source: Ambulatory Visit | Attending: Obstetrics and Gynecology | Admitting: Obstetrics and Gynecology

## 2021-03-30 DIAGNOSIS — M8580 Other specified disorders of bone density and structure, unspecified site: Secondary | ICD-10-CM | POA: Diagnosis not present

## 2021-03-30 DIAGNOSIS — Z780 Asymptomatic menopausal state: Secondary | ICD-10-CM | POA: Insufficient documentation

## 2021-03-30 DIAGNOSIS — M8589 Other specified disorders of bone density and structure, multiple sites: Secondary | ICD-10-CM | POA: Diagnosis not present

## 2021-04-04 ENCOUNTER — Encounter (INDEPENDENT_AMBULATORY_CARE_PROVIDER_SITE_OTHER): Payer: Medicare Other | Admitting: Ophthalmology

## 2021-04-04 ENCOUNTER — Other Ambulatory Visit: Payer: Self-pay

## 2021-04-04 DIAGNOSIS — I10 Essential (primary) hypertension: Secondary | ICD-10-CM | POA: Diagnosis not present

## 2021-04-04 DIAGNOSIS — H43811 Vitreous degeneration, right eye: Secondary | ICD-10-CM

## 2021-04-04 DIAGNOSIS — H35033 Hypertensive retinopathy, bilateral: Secondary | ICD-10-CM

## 2021-04-04 DIAGNOSIS — D3132 Benign neoplasm of left choroid: Secondary | ICD-10-CM

## 2021-04-04 DIAGNOSIS — H35342 Macular cyst, hole, or pseudohole, left eye: Secondary | ICD-10-CM | POA: Diagnosis not present

## 2021-05-24 NOTE — Progress Notes (Signed)
GYNECOLOGY  VISIT   HPI: 81 y.o.   Married  Caucasian  female   [phone] with No LMP recorded. Patient is postmenopausal.   here for  DEXA consult   BMD cone 03/30/21 at [facility].  T score R femur next -1.9 T score L forearm radius -1.2 FRAX score:  major fracture risk 21.8% and hip fracture risk 5.8%.  No reflux.    Has difficulty swallowing when she is stressed.  No hiatal hernia or esophageal stricture.   Patient cares her husband who has COPD.  GYNECOLOGIC HISTORY: No LMP recorded. Patient is postmenopausal. Contraception:  Postmenopausal Menopausal hormone therapy:  None Last mammogram:  01-09-21 calcifications Last pap smear:   07-28-14 normal        OB History     Gravida  3   Para  3   Term  3   Preterm      AB      Living  3      SAB      IAB      Ectopic      Multiple      Live Births                 Patient Active Problem List   Diagnosis Date Noted   Dizziness 01/29/2019   Chest pain 05/24/2016   Sinus bradycardia 05/24/2016   Macular hole, left eye 11/22/2014   Rectocele 09/27/2014   Essential hypertension 06/01/2013   Hyperlipidemia 06/01/2013   Paroxysmal atrial fibrillation ([place]) 06/01/2013    Past Medical History:  Diagnosis Date   Chest pain    a. 05/2016 Myoview: EF 74%, no ischemia/infarct.   History of COVID-19 11/28/2019   History of echocardiogram    a. 04/2016 Echo: EF 60-65%, no rwma.   Hypercholesterolemia    Hypertension    Macular hole of left eye    Mitral valve prolapse    no issues   Osteopenia 2013   T score -1.4 stable from prior DEXA 2007 no FRAX calculated   Paroxysmal atrial fibrillation ([facility])    a. CHA2DS2VASc = 4-->Xarelto.   Pelvic relaxation    Mild and asymptomatic.   PONV (postoperative nausea and vomiting)    "little bit of nausea" after cataract surgery    Past Surgical History:  Procedure Laterality Date   25 GAUGE PARS PLANA VITRECTOMY WITH 20 GAUGE MVR PORT FOR MACULAR HOLE Left  11/22/2014   Procedure: 25 GAUGE PARS PLANA VITRECTOMY WITH 20 GAUGE MVR PORT FOR MACULAR HOLE LEFT EYE ;  Surgeon: Hayden Pedro, MD;  Location: [place];  Service: Ophthalmology;  Laterality: Left;   CATARACT EXTRACTION, BILATERAL     Dr Herbert Deaner   COLONOSCOPY     DILATION AND CURETTAGE OF UTERUS     PT. DOES NOT REMEMBER THE YEAR   DOPPLER ECHOCARDIOGRAPHY  05/13/2007   EYE SURGERY Bilateral    cataract surgery with lens implant   GAS/FLUID EXCHANGE Left 11/22/2014   Procedure: GAS/FLUID EXCHANGE;  Surgeon: Hayden Pedro, MD;  Location: [place];  Service: Ophthalmology;  Laterality: Left;  C3F8   LASER PHOTO ABLATION Left 11/22/2014   Procedure: LASER PHOTO ABLATION;  Surgeon: Hayden Pedro, MD;  Location: [place];  Service: Ophthalmology;  Laterality: Left;  Headscope laser   MEMBRANE PEEL Left 11/22/2014   Procedure: MEMBRANE PEEL;  Surgeon: Hayden Pedro, MD;  Location: [place];  Service: Ophthalmology;  Laterality: Left;   myoview   06/05/2009  SERUM PATCH Left 11/22/2014   Procedure: SERUM PATCH;  Surgeon: Hayden Pedro, MD;  Location: [place];  Service: Ophthalmology;  Laterality: Left;    Current Outpatient Medications  Medication Sig Dispense Refill   ALPRAZolam (XANAX) 0.25 MG tablet Take 0.25 mg by mouth 3 (three) times daily as needed.     amLODipine (NORVASC) 2.5 MG tablet TAKE 1 TABLET BY MOUTH  DAILY 90 tablet 3   Ascorbic Acid (VITAMIN C) 1000 MG tablet Take 1,000 mg by mouth daily.     aspirin 81 MG tablet Take 81 mg by mouth daily.      BIOTIN PO Take by mouth.     Calcium Carb-Cholecalciferol (CALCIUM 1000 + D PO) Take by mouth.     celecoxib (CELEBREX) 200 MG capsule Take 200 mg by mouth 2 (two) times daily as needed.     co-enzyme Q-10 30 MG capsule Take 30 mg by mouth daily.     cyclobenzaprine (FLEXERIL) 10 MG tablet Take 10 mg by mouth daily as needed.     hydrochlorothiazide (HYDRODIURIL) 25 MG tablet Take 25 mg by mouth daily.     lovastatin (MEVACOR) 20 MG tablet  TAKE 1 TABLET BY MOUTH ONCE DAILY AT 6:00 PM 90 tablet 3   Magnesium 400 MG CAPS Take 400 mg by mouth daily.      potassium chloride (KLOR-CON) 10 MEQ tablet Take 10 mEq by mouth 2 (two) times daily.     vitamin E 400 UNIT capsule Take 400 Units by mouth daily.     No current facility-administered medications for this visit.     ALLERGIES: Penicillins and Prednisone  Family History  Problem Relation Age of Onset   Stroke Mother    Heart disease Mother    Kidney disease Mother    Hypertension Mother    Hyperlipidemia Mother    Stroke Brother    Heart disease Brother    Stroke Brother    Heart disease Brother    Heart failure Sister    Cancer Sister        Esophagus/stomach   Stroke Sister    Heart disease Sister    Heart disease Father    Colon cancer Neg Hx    Rectal cancer Neg Hx     Social History   Socioeconomic History   Marital status: Married    Spouse name: Not on file   Number of children: Not on file   Years of education: Not on file   Highest education level: Not on file  Occupational History   Not on file  Tobacco Use   Smoking status: Never   Smokeless tobacco: Never  Vaping Use   Vaping Use: Never used  Substance and Sexual Activity   Alcohol use: Yes    Comment: 2 drinks/month   Drug use: No   Sexual activity: Yes    Birth control/protection: Post-menopausal    Comment: 1st intercourse 3 yo-1 partner  Other Topics Concern   Not on file  Social History Narrative   Not on file   Social Determinants of Health   Financial Resource Strain: Not on file  Food Insecurity: Not on file  Transportation Needs: Not on file  Physical Activity: Not on file  Stress: Not on file  Social Connections: Not on file  Intimate Partner Violence: Not on file    Review of Systems  See HPI.  PHYSICAL EXAMINATION:    BP 136/80 (BP Location: Right Arm, Patient Position: Sitting, Cuff Size:  Normal)     General appearance: alert, cooperative and appears  stated age  ASSESSMENT  Osteopenia of hip with an increased risk of both major osteoporotic fracture and hip fracture according to FRAX model.   Hx prior atrial fibrillation.   PLAN  We discussed the bone density report and the FRAX calculations.  Calcium, vit D and weight bearing exercise recommended.  Treatment options with bisphosphonates oral and IV reviewed.  I do not recommend Evista.   May be a Prolia candidate if bisphosphonate not tolerated. Will check BMP and vit D level.  She will start Fosamax 70 mg weekly after labs are back and if appropriate.  Instructed in how to take medication and potential side effects.  Next BMD in 2 years.  Fall risk reduction reviewed.  FU in 2 months for med check, sooner if needed. Questions invited and answered.    An After Visit Summary was printed and given to the patient.  36 min  total time was spent for this patient encounter, including preparation, face-to-face counseling with the patient, coordination of care, and documentation of the encounter.

## 2021-05-25 ENCOUNTER — Ambulatory Visit: Payer: Medicare Other | Admitting: Obstetrics and Gynecology

## 2021-05-25 ENCOUNTER — Encounter: Payer: Self-pay | Admitting: Obstetrics and Gynecology

## 2021-05-25 ENCOUNTER — Other Ambulatory Visit: Payer: Self-pay

## 2021-05-25 VITALS — BP 136/80

## 2021-05-25 DIAGNOSIS — M8580 Other specified disorders of bone density and structure, unspecified site: Secondary | ICD-10-CM

## 2021-05-25 DIAGNOSIS — Z780 Asymptomatic menopausal state: Secondary | ICD-10-CM | POA: Diagnosis not present

## 2021-05-25 MED ORDER — ALENDRONATE SODIUM 70 MG PO TABS: 70.0000 mg | ORAL_TABLET | ORAL | 2 refills | Status: DC

## 2021-05-25 NOTE — Patient Instructions (Signed)
Alendronate Tablets What is this medication? ALENDRONATE (a LEN droe nate) prevents and treats osteoporosis. It may also be used to treat Paget disease of the bone. It works by making your bones stronger and less likely to break (fracture). It belongs to a group of medications called bisphosphonates. This medicine may be used for other purposes; ask your health care provider or pharmacist if you have questions. COMMON BRAND NAME(S): Fosamax What should I tell my care team before I take this medication? They need to know if you have any of these conditions: Bleeding disorder Cancer Dental disease Difficulty swallowing Infection (fever, chills, cough, sore throat, pain or trouble passing urine) Kidney disease Low levels of calcium or other minerals in the blood Low red blood cell counts Receiving steroids like dexamethasone or prednisone Stomach or intestine problems Trouble sitting or standing for 30 minutes An unusual or allergic reaction to alendronate, other medications, foods, dyes or preservatives Pregnant or trying to get pregnant Breast-feeding How should I use this medication? Take this medication by mouth with a full glass of water. Take it as directed on the prescription label at the same time every day. Take the dose right after waking up. Do not eat or drink anything before taking it. Do not take it with any other drink except water. Do not chew or crush the tablet. After taking it, do not eat breakfast, drink, or take any other medications or vitamins for at least 30 minutes. Sit or stand up for at least 30 minutes after you take it. Do not lie down. Keep taking it unless your care team tells you to stop. A special MedGuide will be given to you by the pharmacist with each prescription and refill. Be sure to read this information carefully each time. Talk to your care team about the use of this medication in children. Special care may be needed. Overdosage: If you think you have  taken too much of this medicine contact a poison control center or emergency room at once. NOTE: This medicine is only for you. Do not share this medicine with others. What if I miss a dose? If you take your medication once a day, skip it. Take your next dose at the scheduled time the next morning. Do not take two doses on the same day. If you take your medication once a week, take the missed dose on the morning after you remember. Do not take two doses on the same day. What may interact with this medication? Aluminum hydroxide Antacids Aspirin Calcium supplements Medications for inflammation like ibuprofen, naproxen, and others Iron supplements Magnesium supplements Vitamins with minerals This list may not describe all possible interactions. Give your health care provider a list of all the medicines, herbs, non-prescription drugs, or dietary supplements you use. Also tell them if you smoke, drink alcohol, or use illegal drugs. Some items may interact with your medicine. What should I watch for while using this medication? Visit your care team for regular checks on your progress. It may be some time before you see the benefit from this medication. Some people who take this medication have severe bone, joint, or muscle pain. This medication may also increase your risk for jaw problems or a broken thigh bone. Tell your care team right away if you have severe pain in your jaw, bones, joints, or muscles. Tell you care team if you have any pain that does not go away or that gets worse. Tell your dentist and dental surgeon that you are   taking this medication. You should not have major dental surgery while on this medication. See your dentist to have a dental exam and fix any dental problems before starting this medication. Take good care of your teeth while on this medication. Make sure you see your dentist for regular follow-up appointments. You should make sure you get enough calcium and vitamin D  while you are taking this medication. Discuss the foods you eat and the vitamins you take with your care team. You may need blood work done while you are taking this medication. What side effects may I notice from receiving this medication? Side effects that you should report to your care team as soon as possible: Allergic reactions--skin rash, itching, hives, swelling of the face, lips, tongue, or throat Low calcium level--muscle pain or cramps, confusion, tingling, or numbness in the hands or feet Osteonecrosis of the jaw--pain, swelling, or redness in the mouth, numbness of the jaw, poor healing after dental work, unusual discharge from the mouth, visible bones in the mouth Pain or trouble swallowing Severe bone, joint, or muscle pain Stomach bleeding--bloody or black, tar-like stools, vomiting blood or brown material that looks like coffee grounds Side effects that usually do not require medical attention (report to your care team if they continue or are bothersome): Constipation Diarrhea Nausea Stomach pain This list may not describe all possible side effects. Call your doctor for medical advice about side effects. You may report side effects to FDA at 1-800-FDA-1088. Where should I keep my medication? Keep out of the reach of children and pets. Store at room temperature between 15 and 30 degrees C (59 and 86 degrees F). Throw away any unused medication after the expiration date. NOTE: This sheet is a summary. It may not cover all possible information. If you have questions about this medicine, talk to your doctor, pharmacist, or health care provider.  2022 Elsevier/Gold Standard (2020-04-27 00:00:00)  

## 2021-05-26 LAB — BASIC METABOLIC PANEL
BUN/Creatinine Ratio: 17 (calc) (ref 6–22)
BUN: 18 mg/dL (ref 7–25)
CO2: 30 mmol/L (ref 20–32)
Calcium: 10.4 mg/dL (ref 8.6–10.4)
Chloride: 98 mmol/L (ref 98–110)
Creat: 1.09 mg/dL — ABNORMAL HIGH (ref 0.60–0.95)
Glucose, Bld: 104 mg/dL — ABNORMAL HIGH (ref 65–99)
Potassium: 3.9 mmol/L (ref 3.5–5.3)
Sodium: 137 mmol/L (ref 135–146)

## 2021-05-29 DIAGNOSIS — R7989 Other specified abnormal findings of blood chemistry: Secondary | ICD-10-CM

## 2021-05-29 HISTORY — DX: Other specified abnormal findings of blood chemistry: R79.89

## 2021-05-29 LAB — VITAMIN D 25 HYDROXY (VIT D DEFICIENCY, FRACTURES): Vit D, 25-Hydroxy: 50 ng/mL (ref 30–100)

## 2021-05-29 LAB — COMPLETE METABOLIC PANEL WITH GFR
AG Ratio: 1.5 (calc) (ref 1.0–2.5)
ALT: 17 U/L (ref 6–29)
AST: 27 U/L (ref 10–35)
Albumin: 4.8 g/dL (ref 3.6–5.1)
Alkaline phosphatase (APISO): 50 U/L (ref 37–153)
BUN/Creatinine Ratio: 17 (calc) (ref 6–22)
BUN: 18 mg/dL (ref 7–25)
CO2: 30 mmol/L (ref 20–32)
Calcium: 10.4 mg/dL (ref 8.6–10.4)
Chloride: 98 mmol/L (ref 98–110)
Creat: 1.09 mg/dL — ABNORMAL HIGH (ref 0.60–0.95)
Globulin: 3.2 g/dL (calc) (ref 1.9–3.7)
Glucose, Bld: 104 mg/dL — ABNORMAL HIGH (ref 65–99)
Potassium: 3.9 mmol/L (ref 3.5–5.3)
Sodium: 137 mmol/L (ref 135–146)
Total Bilirubin: 0.5 mg/dL (ref 0.2–1.2)
Total Protein: 8 g/dL (ref 6.1–8.1)
eGFR: 51 mL/min/{1.73_m2} — ABNORMAL LOW (ref >=60)

## 2021-05-30 ENCOUNTER — Encounter: Payer: Self-pay | Admitting: Obstetrics and Gynecology

## 2021-06-25 DIAGNOSIS — M25551 Pain in right hip: Secondary | ICD-10-CM | POA: Diagnosis not present

## 2021-06-25 DIAGNOSIS — M5431 Sciatica, right side: Secondary | ICD-10-CM | POA: Diagnosis not present

## 2021-07-11 ENCOUNTER — Encounter: Payer: Self-pay | Admitting: Orthopedic Surgery

## 2021-07-11 ENCOUNTER — Ambulatory Visit (INDEPENDENT_AMBULATORY_CARE_PROVIDER_SITE_OTHER): Payer: Medicare Other | Admitting: Orthopedic Surgery

## 2021-07-11 ENCOUNTER — Other Ambulatory Visit: Payer: Self-pay

## 2021-07-11 ENCOUNTER — Ambulatory Visit (INDEPENDENT_AMBULATORY_CARE_PROVIDER_SITE_OTHER): Payer: Medicare Other

## 2021-07-11 DIAGNOSIS — M5441 Lumbago with sciatica, right side: Secondary | ICD-10-CM

## 2021-07-12 NOTE — Progress Notes (Signed)
New Patient Visit ? ?Assessment: ?Brittany Weaver is a 81 y.o. female with the following: ?1. Acute back pain with sciatica, right ? ?Plan: ?[name] has lower back pain, which started approximately 3 weeks ago.  She did experience some radiculopathy into the right leg.  Over the past 3 weeks her pain has progressively improved.  Radiographs today demonstrates some degenerative changes, without obvious anterolisthesis.  On physical exam, she does not have any signs of nerve compression.  We discussed multiple potential treatment options, as well as warning signs.  She should seek treatment if the pains into her right leg return and are worse.  Otherwise, continue with Celebrex and Flexeril.  I provided her with a gentle back exercises.  If she continues to have issues, we can also consider physical therapy. ? ? ?Follow-up: ?Return if symptoms worsen or fail to improve. ? ?Subjective: ? ?Chief Complaint  ?Patient presents with  ? Back Pain  ?  Right buttock and LBP, was having right radiculopathy at onset 3 weeks ago.  Dr Hilma Favors gave her a shot, that helped some.  Also celebrex and muscle relaxer have helped.  - n/t, just pain with the radiculopathy.  Feels like it may be starting to want to run down her leg again.  NKI.  ? ? ?History of Present Illness: ?[name] is a 81 y.o. female who has been referred to clinic today by Sharilyn Sites, MD for evaluation of low back pain.  She states that she started to have pain in the right side of her lower back approximately 3 weeks ago.  At that point, the pain was radiating into her legs.  Is very uncomfortable.  She presented to her primary care provider, who gave her an intramuscular shot of steroid.  Her pain progressively improved.  She been taking Celebrex and Flexeril, and these are also helping with her symptoms.  She states she has difficulty laying on her left side.  No prior injuries to her lower back. ? ? ?Review of Systems: ?No fevers or chills ?No numbness  or tingling ?No chest pain ?No shortness of breath ?No bowel or bladder dysfunction ?No GI distress ?No headaches ? ? ?Medical History: ? ?Past Medical History:  ?Diagnosis Date  ? Chest pain   ? a. 05/2016 Myoview: EF 74%, no ischemia/infarct.  ? Elevated serum creatinine 05/29/2021  ? decreased GFR  ? History of COVID-19 11/28/2019  ? History of echocardiogram   ? a. 04/2016 Echo: EF 60-65%, no rwma.  ? Hypercholesterolemia   ? Hypertension   ? Macular hole of left eye   ? Mitral valve prolapse   ? no issues  ? Osteopenia 2013  ? T score -1.4 stable from prior DEXA 2007 no FRAX calculated  ? Paroxysmal atrial fibrillation (HCC)   ? a. CHA2DS2VASc = 4-->Xarelto.  ? Pelvic relaxation   ? Mild and asymptomatic.  ? PONV (postoperative nausea and vomiting)   ? "little bit of nausea" after cataract surgery  ? ? ?Past Surgical History:  ?Procedure Laterality Date  ? 25 GAUGE PARS PLANA VITRECTOMY WITH 20 GAUGE MVR PORT FOR MACULAR HOLE Left 11/22/2014  ? Procedure: 25 GAUGE PARS PLANA VITRECTOMY WITH 20 GAUGE MVR PORT FOR MACULAR HOLE LEFT EYE ;  Surgeon: Hayden Pedro, MD;  Location: [facility];  Service: Ophthalmology;  Laterality: Left;  ? CATARACT EXTRACTION, BILATERAL    ? Dr Herbert Deaner  ? COLONOSCOPY    ? DILATION AND CURETTAGE OF UTERUS    ?  PT. DOES NOT REMEMBER THE YEAR  ? DOPPLER ECHOCARDIOGRAPHY  05/13/2007  ? EYE SURGERY Bilateral   ? cataract surgery with lens implant  ? GAS/FLUID EXCHANGE Left 11/22/2014  ? Procedure: GAS/FLUID EXCHANGE;  Surgeon: Hayden Pedro, MD;  Location: [facility];  Service: Ophthalmology;  Laterality: Left;  [id number]  ? LASER PHOTO ABLATION Left 11/22/2014  ? Procedure: LASER PHOTO ABLATION;  Surgeon: Hayden Pedro, MD;  Location: [facility];  Service: Ophthalmology;  Laterality: Left;  Headscope laser  ? MEMBRANE PEEL Left 11/22/2014  ? Procedure: MEMBRANE PEEL;  Surgeon: Hayden Pedro, MD;  Location: [facility];  Service: Ophthalmology;  Laterality: Left;  ? myoview   06/05/2009  ? SERUM PATCH Left 11/22/2014   ? Procedure: SERUM PATCH;  Surgeon: Hayden Pedro, MD;  Location: [facility];  Service: Ophthalmology;  Laterality: Left;  ? ? ?Family History  ?Problem Relation Age of Onset  ? Stroke Mother   ? Heart disease Mother   ? Kidney disease Mother   ? Hypertension Mother   ? Hyperlipidemia Mother   ? Stroke Brother   ? Heart disease Brother   ? Stroke Brother   ? Heart disease Brother   ? Heart failure Sister   ? Cancer Sister   ?     Esophagus/stomach  ? Stroke Sister   ? Heart disease Sister   ? Heart disease Father   ? Colon cancer Neg Hx   ? Rectal cancer Neg Hx   ? ?Social History  ? ?Tobacco Use  ? Smoking status: Never  ? Smokeless tobacco: Never  ?Vaping Use  ? Vaping Use: Never used  ?Substance Use Topics  ? Alcohol use: Yes  ?  Comment: 2 drinks/month  ? Drug use: No  ? ? ?Allergies  ?Allergen Reactions  ? Penicillins Hives  ?  Has patient had a PCN reaction causing immediate rash, facial/tongue/throat swelling, SOB or lightheadedness with hypotension: unknown ?Has patient had a PCN reaction causing severe rash involving mucus membranes or skin necrosis: unknown ?Has patient had a PCN reaction that required hospitalization: no ?Has patient had a PCN reaction occurring within the last 10 years: no ?If all of the above answers are "NO", then may proceed with Cephalosporin use. ?  ? Prednisone Other (See Comments)  ?  DIZZINESS and swelling  ? ? ?Current Meds  ?Medication Sig  ? alendronate (FOSAMAX) 70 MG tablet Take 1 tablet (70 mg total) by mouth every 7 (seven) days. Take with a full glass of water on an empty stomach.  ? ALPRAZolam (XANAX) 0.25 MG tablet Take 0.25 mg by mouth 3 (three) times daily as needed.  ? amLODipine (NORVASC) 2.5 MG tablet TAKE 1 TABLET BY MOUTH  DAILY  ? Ascorbic Acid (VITAMIN C) 1000 MG tablet Take 1,000 mg by mouth daily.  ? aspirin 81 MG tablet Take 81 mg by mouth daily.   ? BIOTIN PO Take by mouth.  ? Calcium Carb-Cholecalciferol (CALCIUM 1000 + D PO) Take by mouth.  ? celecoxib  (CELEBREX) 200 MG capsule Take 200 mg by mouth 2 (two) times daily as needed.  ? co-enzyme Q-10 30 MG capsule Take 30 mg by mouth daily.  ? cyclobenzaprine (FLEXERIL) 10 MG tablet Take 10 mg by mouth daily as needed.  ? hydrochlorothiazide (HYDRODIURIL) 25 MG tablet Take 25 mg by mouth daily.  ? lovastatin (MEVACOR) 20 MG tablet TAKE 1 TABLET BY MOUTH ONCE DAILY AT 6:00 PM  ? Magnesium 400 MG CAPS  Take 400 mg by mouth daily.   ? potassium chloride (KLOR-CON) 10 MEQ tablet Take 10 mEq by mouth 2 (two) times daily.  ? vitamin E 400 UNIT capsule Take 400 Units by mouth daily.  ? ? ?Objective: ?There were no vitals taken for this visit. ? ?Physical Exam: ? ?General: Alert and oriented. and No acute distress. ?Gait: Normal gait. ? ?Low back without deformity.  Tenderness palpation along the lower back, particularly on the right side.  Negative straight leg raise.  Strength in bilateral lower extremities is 5/5.  Sensations intact in all dermatomal distributions.  1+ patellar tendon reflex.  No tenderness palpation over the greater trochanters bilaterally. ? ?IMAGING: ?I personally ordered and reviewed the following images ? ?Standing lumbar x-rays were obtained in clinic today.  No anterolisthesis.  There is thoracolumbar scoliosis, which could be degenerative.  Minimal degenerative changes with exception L4-5 where there is some loss of disc height.  Most severe degenerative changes noted at L5-S1 where there is near complete loss of disc height with some associated osteophytes. ? ?Impression: Lumbar spine x-ray with degenerative changes, most notably at L5-S1.  Thoracolumbar scoliosis. ? ? ?New Medications:  ?No orders of the defined types were placed in this encounter. ? ? ? ? ?Mordecai Rasmussen, MD ? ?07/12/2021 ?12:41 PM ? ? ?

## 2021-08-23 DIAGNOSIS — H6980 Other specified disorders of Eustachian tube, unspecified ear: Secondary | ICD-10-CM | POA: Diagnosis not present

## 2021-10-21 ENCOUNTER — Other Ambulatory Visit: Payer: Self-pay | Admitting: Cardiovascular Disease

## 2021-12-04 ENCOUNTER — Other Ambulatory Visit: Payer: Self-pay | Admitting: Cardiovascular Disease

## 2022-02-25 ENCOUNTER — Other Ambulatory Visit: Payer: Self-pay | Admitting: Obstetrics and Gynecology

## 2022-02-25 DIAGNOSIS — Z1231 Encounter for screening mammogram for malignant neoplasm of breast: Secondary | ICD-10-CM

## 2022-03-15 ENCOUNTER — Ambulatory Visit: Payer: Medicare Other

## 2022-03-27 DIAGNOSIS — R7309 Other abnormal glucose: Secondary | ICD-10-CM | POA: Diagnosis not present

## 2022-03-27 DIAGNOSIS — I4891 Unspecified atrial fibrillation: Secondary | ICD-10-CM | POA: Diagnosis not present

## 2022-03-27 DIAGNOSIS — E7849 Other hyperlipidemia: Secondary | ICD-10-CM | POA: Diagnosis not present

## 2022-03-27 DIAGNOSIS — E782 Mixed hyperlipidemia: Secondary | ICD-10-CM | POA: Diagnosis not present

## 2022-03-27 DIAGNOSIS — Z0001 Encounter for general adult medical examination with abnormal findings: Secondary | ICD-10-CM | POA: Diagnosis not present

## 2022-03-27 DIAGNOSIS — I10 Essential (primary) hypertension: Secondary | ICD-10-CM | POA: Diagnosis not present

## 2022-03-27 DIAGNOSIS — E119 Type 2 diabetes mellitus without complications: Secondary | ICD-10-CM | POA: Diagnosis not present

## 2022-03-29 NOTE — Progress Notes (Unsigned)
Cardiology Office Note:    Date:  04/03/2022   ID:  RUBBY BARBARY, DOB 1941-02-21, MRN [id number]  PCP:  Sharilyn Sites, MD  Cardiologist:  Quay Burow, MD  Electrophysiologist:  None   Referring MD: Sharilyn Sites, MD   Chief Complaint: routine follow-up of atypical chest pain and dizziness  History of Present Illness:    Brittany Weaver is a 81 y.o. female with a history of atypical chest pain with negative Myoview in 2018, self-reported history of paroxysmal atria fibrillation not on anticoagulation (due to patient preference and no clear documentation of this), paroxysmal SVT and NSVT noted on monitor in 2018, chronic intermittent dizziness, hypertension, and hyperlipidemia who is followed by Dr. Gwenlyn Found and presents today for routine follow-up.   Patient was admitted in 04/2016 for further evaluation of atypical chest pain. EKG showed no acute findings and high-sensitivity troponin was negative. Echo showed LVEF of 60-65% with no regional wall motion abnormalities. Outpatient stress test was recommended and this came back low risk with no evidence of ischemia. She reportedly has a history of paroxysmal atrial fibrillation but has not been on anticoagulation given patient preference and no clear documentation of atrial fibrillation. 30-Day Monitor in 2018 showed underlying sinus rhythm with occasional short runs of paroxysmal SVT and NSVT. One of the episodes of NSVT did appear irregular and there was question on whether this could be atrial fibrillation with aberrancy so she was [facility]. She was seen by Roderic Palau, NP in the [facility], who reviewed strip with Dr. Rayann Heman and it was not felt like this represented atrial fibrillation.   Patient was last seen by Dr. Gwenlyn Found in 02/2021 at which time she reported a couple of episodes of very brief episodes dizziness within the last couple of weeks. She was not able to see any change in her heart rate or rhythm on her Apple  Watch at these times. Otherwise, was doing well from a cardiac standpoint. No additional cardiac work-up was felt to be necessary.  Patient presents today for follow-up. She continues to have very brief episodes of dizziness intermittently. These episodes are so brief that she does not even have time to catch them on her Apple Watch (typically last about 10 seconds).. She states she feels like she is going to pass out during these events. They usually occur when she is up moving around but she has had a couple of episodes while driving. She states she had associated right arm pain during one of these episode a couple of weeks ago that felt like three hard thumps like "bump, bump, bump" in her right arm. She had several episodes of dizziness/ near syncope in a one week span several weeks ago but has not had any recurrent episodes in the last 1-2 weeks. This is how this usually happens. She will have a cluster of episodes back to back and then can go several montns without having any problems. These are the same type of dizzy episodes that she has had for years. She denies any falls or syncope with these events.  She also describes some intermittent chest pain that she describes as tightness. Some of it sounds rather atypical and likely musculoskeletal as she states it resolves when she lays down on the ground and stretches out her back. However she also describes some exertional pain that will improve if she sits down and rests. She states this has been happening intermittently for about the past year  but does not occur very frequently. She denies any shortness of breath, orthopnea, PND, or lower extremity edema.   Past Medical History:  Diagnosis Date   Chest pain    a. 05/2016 Myoview: EF 74%, no ischemia/infarct.   Elevated serum creatinine 05/29/2021   decreased GFR   History of COVID-19 11/28/2019   History of echocardiogram    a. 04/2016 Echo: EF 60-65%, no rwma.   Hypercholesterolemia     Hypertension    Macular hole of left eye    Mitral valve prolapse    no issues   Osteopenia 2013   T score -1.4 stable from prior DEXA 2007 no FRAX calculated   Paroxysmal atrial fibrillation (HCC)    a. CHA2DS2VASc = 4-->Xarelto.   Pelvic relaxation    Mild and asymptomatic.   PONV (postoperative nausea and vomiting)    "little bit of nausea" after cataract surgery    Past Surgical History:  Procedure Laterality Date   25 GAUGE PARS PLANA VITRECTOMY WITH 20 GAUGE MVR PORT FOR MACULAR HOLE Left 11/22/2014   Procedure: 25 GAUGE PARS PLANA VITRECTOMY WITH 20 GAUGE MVR PORT FOR MACULAR HOLE LEFT EYE ;  Surgeon: Hayden Pedro, MD;  Location: [facility];  Service: Ophthalmology;  Laterality: Left;   CATARACT EXTRACTION, BILATERAL     Dr Herbert Deaner   COLONOSCOPY     DILATION AND CURETTAGE OF UTERUS     PT. DOES NOT REMEMBER THE YEAR   DOPPLER ECHOCARDIOGRAPHY  05/13/2007   EYE SURGERY Bilateral    cataract surgery with lens implant   GAS/FLUID EXCHANGE Left 11/22/2014   Procedure: GAS/FLUID EXCHANGE;  Surgeon: Hayden Pedro, MD;  Location: [facility];  Service: Ophthalmology;  Laterality: Left;  C3F8   LASER PHOTO ABLATION Left 11/22/2014   Procedure: LASER PHOTO ABLATION;  Surgeon: Hayden Pedro, MD;  Location: [facility];  Service: Ophthalmology;  Laterality: Left;  Headscope laser   MEMBRANE PEEL Left 11/22/2014   Procedure: MEMBRANE PEEL;  Surgeon: Hayden Pedro, MD;  Location: [facility];  Service: Ophthalmology;  Laterality: Left;   myoview   06/05/2009   SERUM PATCH Left 11/22/2014   Procedure: SERUM PATCH;  Surgeon: Hayden Pedro, MD;  Location: [facility];  Service: Ophthalmology;  Laterality: Left;    Current Medications: Current Meds  Medication Sig   alendronate (FOSAMAX) 70 MG tablet Take 1 tablet (70 mg total) by mouth every 7 (seven) days. Take with a full glass of water on an empty stomach.   ALPRAZolam (XANAX) 0.25 MG tablet Take 0.25 mg by mouth 3 (three) times daily as needed.    amLODipine (NORVASC) 2.5 MG tablet TAKE 1 TABLET BY MOUTH  DAILY   Ascorbic Acid (VITAMIN C) 1000 MG tablet Take 1,000 mg by mouth daily.   aspirin 81 MG tablet Take 81 mg by mouth daily.    BIOTIN PO Take by mouth.   Calcium Carb-Cholecalciferol (CALCIUM 1000 + D PO) Take 600 mg by mouth in the morning and at bedtime.   celecoxib (CELEBREX) 200 MG capsule Take 200 mg by mouth 2 (two) times daily as needed.   co-enzyme Q-10 30 MG capsule Take 30 mg by mouth daily.   cyclobenzaprine (FLEXERIL) 10 MG tablet Take 10 mg by mouth daily as needed.   hydrochlorothiazide (HYDRODIURIL) 25 MG tablet Take 25 mg by mouth daily.   lovastatin (MEVACOR) 20 MG tablet TAKE 1 TABLET BY MOUTH ONCE  DAILY AT 6:00 PM   Magnesium 400 MG  CAPS Take 400 mg by mouth daily.    metoprolol tartrate (LOPRESSOR) 100 MG tablet Take 1 tablet (100 mg total) by mouth once for 1 dose. Take 1 1/2 to 2 Hours prior to Scan   potassium chloride (KLOR-CON) 10 MEQ tablet Take 10 mEq by mouth 2 (two) times daily.   vitamin E 400 UNIT capsule Take 400 Units by mouth daily.     Allergies:   Penicillins and Prednisone   Social History   Socioeconomic History   Marital status: Married    Spouse name: Not on file   Number of children: Not on file   Years of education: Not on file   Highest education level: Not on file  Occupational History   Not on file  Tobacco Use   Smoking status: Never   Smokeless tobacco: Never  Vaping Use   Vaping Use: Never used  Substance and Sexual Activity   Alcohol use: Yes    Comment: 2 drinks/month   Drug use: No   Sexual activity: Yes    Birth control/protection: Post-menopausal    Comment: 1st intercourse 6 yo-1 partner  Other Topics Concern   Not on file  Social History Narrative   Not on file   Social Determinants of Health   Financial Resource Strain: Not on file  Food Insecurity: Not on file  Transportation Needs: Not on file  Physical Activity: Not on file  Stress: Not on  file  Social Connections: Not on file     Family History: The patient's family history includes Cancer in her sister; Heart disease in her brother, brother, father, mother, and sister; Heart failure in her sister; Hyperlipidemia in her mother; Hypertension in her mother; Kidney disease in her mother; Stroke in her brother, brother, mother, and sister. There is no history of Colon cancer or Rectal cancer.  ROS:   Please see the history of present illness.     EKGs/Labs/Other Studies Reviewed:    The following studies were reviewed:  Echocardiogram 05/25/2016: Study Conclusions: - Procedure narrative: Transthoracic echocardiography. Image    quality was poor. The study was technically difficult, as a    result of poor acoustic windows.  - Left ventricle: The cavity size was normal. Wall thickness was    increased in a pattern of mild LVH. Systolic function was normal.    The estimated ejection fraction was in the range of 60% to 65%.    Wall motion was normal; there were no regional wall motion    abnormalities. Diastolic dysfunction, grade indeterminate.  - Mitral valve: Mildly calcified annulus.  - Inferior vena cava: The vessel was dilated. The respirophasic    diameter changes were blunted (< 50%), consistent with elevated    central venous pressure. Estimated CVP 15 mmHg.  _______________  Myoview 06/06/2016: The left ventricular ejection fraction is hyperdynamic (>65%). There was no ST segment deviation noted during stress. No T wave inversion was noted during stress. The study is normal. This is a low risk study. Nuclear stress EF: 74%.   Low risk stress nuclear study with normal perfusion and normal left ventricular regional and global systolic function. _______________  Monitor 07/02/2016 to 07/31/2016: 1. SR, ST, PACs, PVCs 2. Occassional short runs of PSVT, atrial runs 3. Occasional short runs of NSVT   EKG:  EKG ordered today. EKG personally reviewed and demonstrates  normal sinus rhythm, rate 78 bpm, with no acute ST/T changes. Left axis deviation. Normal PR and QRS intervals. QTc 453 ms.  Recent Labs: 05/25/2021: ALT 17; BUN 18; BUN 18; Creat 1.09; Creat 1.09; Potassium 3.9; Potassium 3.9; Sodium 137; Sodium 137  Recent Lipid Panel    Component Value Date/Time   CHOL 149 06/17/2014 0930   TRIG 111 06/17/2014 0930   HDL 60 06/17/2014 0930   CHOLHDL 2.7 05/31/2013 1119   VLDL 30 05/31/2013 1119   LDLCALC 67 06/17/2014 0930    Physical Exam:    Vital Signs: BP (!) 122/58 (BP Location: Left Arm, Patient Position: Sitting, Cuff Size: Normal)   Pulse 78   Ht '5\' 6"'$  (1.676 m)   Wt 158 lb 3.2 oz (71.8 kg)   SpO2 96%   BMI 25.53 kg/m     Wt Readings from Last 3 Encounters:  04/03/22 158 lb 3.2 oz (71.8 kg)  03/14/21 166 lb (75.3 kg)  03/07/21 166 lb (75.3 kg)     General: 81 y.o. Caucasian female in no acute distress. HEENT: Normocephalic and atraumatic. Sclera clear.  Neck: Supple. No carotid bruits. No JVD. Heart: RRR. Distinct S1 and S2. No murmurs, gallops, or rubs. Radial and pulses 2+ and equal bilaterally. Lungs: No increased work of breathing. Clear to ausculation bilaterally. No wheezes, rhonchi, or rales.  Abdomen: Soft, non-distended, and non-tender to palpation.  Extremities: Trace lower extremity edema bilaterally.    Skin: Warm and dry. Neuro: Alert and oriented x3. No focal deficits. Psych: Normal affect. Responds appropriately.   Assessment:    1. Dizziness   2. Near syncope   3. Chest pain, unspecified type   4. Essential hypertension   5. Hyperlipidemia, unspecified hyperlipidemia type     Plan:    Dizziness Near Syncope Patient continues to report intermittent episodes of dizziness and near syncope that are very brief (about 10 seconds). These are the same episodes that she has had for years. She will have several episodes back to back and then can go months without having any recurrent episodes. She had several  episodes a couple of weeks ago but nothing in the last 1-2 weeks. Prior monitor in 2018 showed showed short runs of paroxysmal SVT and NSVT. I suspect this is likely what she is having now. It will be difficult to put a monitor on her giving sometimes she goes months without any episodes. However, given near syncope and the fact that she has had a couple episode while driving, may need to consider a referral to EP for a loop recorder at some point. Overall seems stable so will continue to monitor for now and can discuss this again at follow-up visit.  Chest Pain Patient reports chest pain that she describes as tightness. It sounds rather atypical and she states it often improves when she lays down and stretches her back. However, she also describes some exertional chest tightness that improves when she sits down and rests. Prior Myoview in 2018 was negative for ischemia. EKG today shows no acute ischemic changes. She has a strong family history of CAD with a sister having 2 "open heart surgeries." I think she would benefit from another ischemic evaluation. Discussed Myoview vs coronary CTA. After shared decision making, decision was made to proceed with coronary CTA. She recently had labs checked at PCP's office last week so will request these. If renal functio was not checked, she will need a BMET. Will give patient a one time dose of Lopressor '100mg'$  to take prior to CTA.  Questionable Atrial Fibrillation Patient has a self reported history of atrial fibrillation but no clear  documentation of this. Monitor in 2018 showed short runs of paroxysmal SVT and NSVT but no atrial fibrillation.  - Maintaining sinus rhythm today.  - Not on any AV nodal agents. - CHA2DS2-VASc = 4 (HTN, age x2, female). However, she has not been on anticoagulation given patient preference and no clear documentation of this.  Hypertension BP well controlled.  - Continue Amlodipine 2.'5mg'$  daily and HCTZ '25mg'$   daily.  Hyperlipidemia Most recent lipid panel on 03/28/2022: Total Cholesterol 162, Triglycerides 128, HDL 61, LDL 79.  - Continue Lovastatin '20mg'$  daily. - If coronary CTA shows any CAD, will recommend switching to high-intensity statin.   Disposition: Follow up in 2 months after CTA.   Medication Adjustments/Labs and Tests Ordered: Current medicines are reviewed at length with the patient today.  Concerns regarding medicines are outlined above.  Orders Placed This Encounter  Procedures   CT CORONARY MORPH W/CTA COR W/SCORE W/CA W/CM &/OR WO/CM   EKG 12-Lead   Meds ordered this encounter  Medications   metoprolol tartrate (LOPRESSOR) 100 MG tablet    Sig: Take 1 tablet (100 mg total) by mouth once for 1 dose. Take 1 1/2 to 2 Hours prior to Scan    Dispense:  1 tablet    Refill:  0    Patient Instructions  Medication Instructions:  No Changes *If you need a refill on your cardiac medications before your next appointment, please call your pharmacy*   Lab Work: No Labs If you have labs (blood work) drawn today and your tests are completely normal, you will receive your results only by: [place] (if you have MyChart) OR A paper copy in the mail If you have any lab test that is abnormal or we need to change your treatment, we will call you to review the results.   Testing/Procedures:   Your cardiac CT will be scheduled at one of the below locations:   [facility] [address] [place], [place] [address] [phone]   If scheduled at [facility], please arrive at the [facility] and Children's Entrance (Entrance C2) of [facility] 30 minutes prior to test start time. You can use the FREE valet parking offered at entrance C (encouraged to control the heart rate for the test)  Proceed to the [facility] Radiology Department (first floor) to check-in and test prep.  All radiology patients and guests should use entrance C2 at [facility], accessed from [facility], even though the hospital's physical address listed is [address].    If scheduled at [facility] or [facility], please arrive 15 mins early for check-in and test prep.   Please follow these instructions carefully (unless otherwise directed):   On the Night Before the Test: Be sure to Drink plenty of water. Do not consume any caffeinated/decaffeinated beverages or chocolate 12 hours prior to your test. Do not take any antihistamines 12 hours prior to your test. If the patient has contrast allergy: Patient will need a prescription for Prednisone and very clear instructions (as follows): Prednisone 50 mg - take 13 hours prior to test Take another Prednisone 50 mg 7 hours prior to test Take another Prednisone 50 mg 1 hour prior to test Take Benadryl 50 mg 1 hour prior to test Patient must complete all four doses of above prophylactic medications. Patient will need a ride after test due to Benadryl.  On the Day of the Test: Drink plenty  of water until 1 hour prior to the test. Do not eat any food 1 hour prior to test. You may take your regular medications prior to the test.  Take metoprolol (Lopressor) two hours prior to test. HOLD Furosemide/Hydrochlorothiazide morning of the test. FEMALES- please wear underwire-free bra if available, avoid dresses & tight clothing        After the Test: Drink plenty of water. After receiving IV contrast, you may experience a mild flushed feeling. This is normal. On occasion, you may experience a mild rash up to 24 hours after the test. This is not dangerous. If this occurs, you can take Benadryl 25 mg and increase your fluid intake. If you experience trouble breathing, this can be serious. If it is severe call 911 IMMEDIATELY. If it is mild, please call our office. If you take any of these medications: Glipizide/Metformin, Avandament,  Glucavance, please do not take 48 hours after completing test unless otherwise instructed.  We will call to schedule your test 2-4 weeks out understanding that some insurance companies will need an authorization prior to the service being performed.   For non-scheduling related questions, please contact the cardiac imaging nurse navigator should you have any questions/concerns: Marchia Bond, Cardiac Imaging Nurse Navigator Gordy Clement, Cardiac Imaging Nurse Navigator [facility] Direct Office Dial: [phone]   For scheduling needs, including cancellations and rescheduling, please call [place], [phone].    Follow-Up: At [facility], you and your health needs are our priority.  As part of our continuing mission to provide you with exceptional heart care, we have created designated Provider Care Teams.  These Care Teams include your primary Cardiologist (physician) and Advanced Practice Providers (APPs -  Physician Assistants and Nurse Practitioners) who all work together to provide you with the care you need, when you need it.  We recommend signing up for the patient portal called "MyChart".  Sign up information is provided on this After Visit Summary.  MyChart is used to connect with patients for Virtual Visits (Telemedicine).  Patients are able to view lab/test results, encounter notes, upcoming appointments, etc.  Non-urgent messages can be sent to your provider as well.   To learn more about what you can do with MyChart, go to [email or web address].    Your next appointment:   1-2 month(s)  The format for your next appointment:   In Person  Provider:   Sande Rives, PA-C       Signed, Darreld Mclean, PA-C  04/04/2022 4:37 AM    [place]

## 2022-04-03 ENCOUNTER — Encounter: Payer: Self-pay | Admitting: Student

## 2022-04-03 ENCOUNTER — Ambulatory Visit: Payer: Medicare Other | Attending: Student | Admitting: Student

## 2022-04-03 VITALS — BP 122/58 | HR 78 | Ht 66.0 in | Wt 158.2 lb

## 2022-04-03 DIAGNOSIS — R42 Dizziness and giddiness: Secondary | ICD-10-CM

## 2022-04-03 DIAGNOSIS — R55 Syncope and collapse: Secondary | ICD-10-CM | POA: Diagnosis not present

## 2022-04-03 DIAGNOSIS — R079 Chest pain, unspecified: Secondary | ICD-10-CM

## 2022-04-03 DIAGNOSIS — I10 Essential (primary) hypertension: Secondary | ICD-10-CM

## 2022-04-03 DIAGNOSIS — E785 Hyperlipidemia, unspecified: Secondary | ICD-10-CM | POA: Diagnosis not present

## 2022-04-03 MED ORDER — METOPROLOL TARTRATE 100 MG PO TABS: 100.0000 mg | ORAL_TABLET | Freq: Once | ORAL | 0 refills | Status: DC

## 2022-04-03 NOTE — Patient Instructions (Addendum)
Medication Instructions:  No Changes *If you need a refill on your cardiac medications before your next appointment, please call your pharmacy*   Lab Work: No Labs If you have labs (blood work) drawn today and your tests are completely normal, you will receive your results only by: [place] (if you have MyChart) OR A paper copy in the mail If you have any lab test that is abnormal or we need to change your treatment, we will call you to review the results.   Testing/Procedures:   Your cardiac CT will be scheduled at one of the below locations:   [facility] [address] [place], [place] [address] [phone]   If scheduled at [facility], please arrive at the [facility] and Children's Entrance (Entrance C2) of [facility] 30 minutes prior to test start time. You can use the FREE valet parking offered at entrance C (encouraged to control the heart rate for the test)  Proceed to the [facility] Radiology Department (first floor) to check-in and test prep.  All radiology patients and guests should use entrance C2 at [facility], accessed from [facility], even though the hospital's physical address listed is [address]    If scheduled at [facility] or [facility], please arrive 15 mins early for check-in and test prep.   Please follow these instructions carefully (unless otherwise directed):   On the Night Before the Test: Be sure to Drink plenty of water. Do not consume any caffeinated/decaffeinated beverages or chocolate 12 hours prior to your test. Do not take any antihistamines 12 hours prior to your test. If the patient has contrast allergy: Patient will need a prescription for Prednisone and very clear instructions (as follows): Prednisone 50 mg - take 13 hours prior to test Take another Prednisone 50 mg 7 hours prior to test Take another Prednisone 50  mg 1 hour prior to test Take Benadryl 50 mg 1 hour prior to test Patient must complete all four doses of above prophylactic medications. Patient will need a ride after test due to Benadryl.  On the Day of the Test: Drink plenty of water until 1 hour prior to the test. Do not eat any food 1 hour prior to test. You may take your regular medications prior to the test.  Take metoprolol (Lopressor) two hours prior to test. HOLD Furosemide/Hydrochlorothiazide morning of the test. FEMALES- please wear underwire-free bra if available, avoid dresses & tight clothing        After the Test: Drink plenty of water. After receiving IV contrast, you may experience a mild flushed feeling. This is normal. On occasion, you may experience a mild rash up to 24 hours after the test. This is not dangerous. If this occurs, you can take Benadryl 25 mg and increase your fluid intake. If you experience trouble breathing, this can be serious. If it is severe call 911 IMMEDIATELY. If it is mild, please call our office. If you take any of these medications: Glipizide/Metformin, Avandament, Glucavance, please do not take 48 hours after completing test unless otherwise instructed.  We will call to schedule your test 2-4 weeks out understanding that some insurance companies will need an authorization prior to the service being performed.   For non-scheduling related questions, please contact the cardiac imaging nurse navigator should you have any questions/concerns: Marchia Bond, Cardiac Imaging Nurse Navigator Gordy Clement, Cardiac Imaging Nurse Navigator [facility]  Direct Office Dial: [phone]   For scheduling needs, including cancellations and rescheduling, please call [place], [phone].    Follow-Up: At [facility], you and your health needs are our priority.  As part of our continuing mission to provide you with exceptional heart care, we have created  designated Provider Care Teams.  These Care Teams include your primary Cardiologist (physician) and Advanced Practice Providers (APPs -  Physician Assistants and Nurse Practitioners) who all work together to provide you with the care you need, when you need it.  We recommend signing up for the patient portal called "MyChart".  Sign up information is provided on this After Visit Summary.  MyChart is used to connect with patients for Virtual Visits (Telemedicine).  Patients are able to view lab/test results, encounter notes, upcoming appointments, etc.  Non-urgent messages can be sent to your provider as well.   To learn more about what you can do with MyChart, go to [email or web address].    Your next appointment:   1-2 month(s)  The format for your next appointment:   In Person  Provider:   Sande Rives, PA-C

## 2022-04-04 ENCOUNTER — Encounter (INDEPENDENT_AMBULATORY_CARE_PROVIDER_SITE_OTHER): Payer: Medicare Other | Admitting: Ophthalmology

## 2022-04-04 ENCOUNTER — Encounter: Payer: Self-pay | Admitting: Student

## 2022-04-04 DIAGNOSIS — H43811 Vitreous degeneration, right eye: Secondary | ICD-10-CM | POA: Diagnosis not present

## 2022-04-04 DIAGNOSIS — D3132 Benign neoplasm of left choroid: Secondary | ICD-10-CM | POA: Diagnosis not present

## 2022-04-04 DIAGNOSIS — I10 Essential (primary) hypertension: Secondary | ICD-10-CM | POA: Diagnosis not present

## 2022-04-04 DIAGNOSIS — H35033 Hypertensive retinopathy, bilateral: Secondary | ICD-10-CM | POA: Diagnosis not present

## 2022-04-17 ENCOUNTER — Ambulatory Visit
Admission: RE | Admit: 2022-04-17 | Discharge: 2022-04-17 | Disposition: A | Discharge status: Home / Self Care | Payer: Medicare Other | Source: Ambulatory Visit | Attending: Obstetrics and Gynecology | Admitting: Obstetrics and Gynecology

## 2022-04-17 DIAGNOSIS — Z1231 Encounter for screening mammogram for malignant neoplasm of breast: Secondary | ICD-10-CM

## 2022-05-06 ENCOUNTER — Telehealth (HOSPITAL_COMMUNITY): Payer: Self-pay | Admitting: *Deleted

## 2022-05-06 NOTE — Telephone Encounter (Signed)
Reaching out to patient to offer assistance regarding upcoming cardiac imaging study; pt verbalizes understanding of appt date/time, parking situation and where to check in, pre-test NPO status and medications ordered, and verified current allergies; name and call back number provided for further questions should they arise ? ?Corrie Reder RN Navigator Cardiac Imaging ?[facility] ?[phone] office ?[phone] cell ? ?Patient to take 100mg metoprolol tartrate two hours prior to her cardiac CT scan.  She is aware to arrive at 1:30pm. ? ?

## 2022-05-07 ENCOUNTER — Ambulatory Visit (HOSPITAL_COMMUNITY)
Admission: RE | Admit: 2022-05-07 | Discharge: 2022-05-07 | Disposition: A | Discharge status: Home / Self Care | Payer: Medicare Other | Source: Ambulatory Visit | Attending: Student | Admitting: Student

## 2022-05-07 ENCOUNTER — Other Ambulatory Visit: Payer: Self-pay | Admitting: Cardiology

## 2022-05-07 DIAGNOSIS — R931 Abnormal findings on diagnostic imaging of heart and coronary circulation: Secondary | ICD-10-CM | POA: Insufficient documentation

## 2022-05-07 DIAGNOSIS — R079 Chest pain, unspecified: Secondary | ICD-10-CM | POA: Insufficient documentation

## 2022-05-07 DIAGNOSIS — I2510 Atherosclerotic heart disease of native coronary artery without angina pectoris: Secondary | ICD-10-CM | POA: Insufficient documentation

## 2022-05-07 MED ORDER — NITROGLYCERIN 0.4 MG SL SUBL
SUBLINGUAL_TABLET | SUBLINGUAL | Status: AC
  Filled 2022-05-07: qty 2

## 2022-05-07 MED ORDER — NITROGLYCERIN 0.4 MG SL SUBL
0.8000 mg | SUBLINGUAL_TABLET | Freq: Once | SUBLINGUAL | Status: AC
  Administered 2022-05-07: 0.8 mg via SUBLINGUAL

## 2022-05-07 MED ORDER — IOHEXOL 350 MG/ML SOLN
95.0000 mL | Freq: Once | INTRAVENOUS | Status: AC | PRN
  Administered 2022-05-07: 95 mL via INTRAVENOUS

## 2022-05-08 ENCOUNTER — Other Ambulatory Visit (HOSPITAL_COMMUNITY): Payer: Self-pay | Admitting: *Deleted

## 2022-05-08 ENCOUNTER — Other Ambulatory Visit: Payer: Self-pay | Admitting: Cardiology

## 2022-05-08 DIAGNOSIS — R931 Abnormal findings on diagnostic imaging of heart and coronary circulation: Secondary | ICD-10-CM

## 2022-05-09 ENCOUNTER — Ambulatory Visit (HOSPITAL_BASED_OUTPATIENT_CLINIC_OR_DEPARTMENT_OTHER)
Admission: RE | Admit: 2022-05-09 | Discharge: 2022-05-09 | Disposition: A | Discharge status: Home / Self Care | Payer: Medicare Other | Source: Ambulatory Visit | Attending: Cardiology | Admitting: Cardiology

## 2022-05-09 DIAGNOSIS — I2510 Atherosclerotic heart disease of native coronary artery without angina pectoris: Secondary | ICD-10-CM

## 2022-05-09 DIAGNOSIS — R931 Abnormal findings on diagnostic imaging of heart and coronary circulation: Secondary | ICD-10-CM | POA: Diagnosis not present

## 2022-05-09 DIAGNOSIS — R079 Chest pain, unspecified: Secondary | ICD-10-CM | POA: Diagnosis not present

## 2022-05-14 NOTE — Progress Notes (Deleted)
Cardiology Office Note:    Date:  05/14/2022   ID:  Brittany WINDELL, DOB 09-19-1940, MRN [id number]  PCP:  Sharilyn Sites, MD  Cardiologist:  Quay Burow, MD  Electrophysiologist:  None   Referring MD: Sharilyn Sites, MD   Chief Complaint: follow-up of chest pain and dizziness  History of Present Illness:    Brittany Weaver is a 82 y.o. female with a history of history of atypical chest pain with negative Myoview in 2018, self-reported history of paroxysmal atria fibrillation not on anticoagulation (due to patient preference and no clear documentation of this), paroxysmal SVT and NSVT noted on monitor in 2018, chronic intermittent dizziness, hypertension, and hyperlipidemia who is followed by Dr. Gwenlyn Found and presents today for follow-up of chest pain and dizziness.  Patient was admitted in 04/2016 for further evaluation of atypical chest pain. EKG showed no acute findings and high-sensitivity troponin was negative. Echo showed LVEF of 60-65% with no regional wall motion abnormalities. Outpatient stress test was recommended and this came back low risk with no evidence of ischemia. She reportedly has a history of paroxysmal atrial fibrillation but has not been on anticoagulation given patient preference and no clear documentation of atrial fibrillation. 30-Day Monitor in 2018 showed underlying sinus rhythm with occasional short runs of paroxysmal SVT and NSVT. One of the episodes of NSVT did appear irregular and there was question on whether this could be atrial fibrillation with aberrancy so she was referred to the [facility]. She was seen by Roderic Palau, NP in the [facility], who reviewed strip with Dr. Rayann Heman and it was not felt like this represented atrial fibrillation.   Patient was last seen by me in 03/2022 at which time she reported very brief episodes of dizziness and near syncope that typically last about 10 seconds. These episodes usually occurred while up moving around but she stated  she had a couple of episodes while driving. She also reported right arm pain during one of these episodes. In addition, she reported intermittent chest pain that she described as a tightness. It sounded rather atypical and likely musculoskeletal in nature as it would resolved when she laid down on the ground and stretch her back. Given history of paroxysmal SVT and NSVT on prior monitor, there was question whether that is what was causing her dizzy spells. However, repeat monitor was not ordered because patient stated she could go months in between episodes of this. In regards to her chest pain, a coronary CTA was ordered after shared decision making. Coronary CTA showed >50% stenosis of LAD and FFR was positive. ***     Past Medical History:  Diagnosis Date   Chest pain    a. 05/2016 Myoview: EF 74%, no ischemia/infarct.   Elevated serum creatinine 05/29/2021   decreased GFR   History of COVID-19 11/28/2019   History of echocardiogram    a. 04/2016 Echo: EF 60-65%, no rwma.   Hypercholesterolemia    Hypertension    Macular hole of left eye    Mitral valve prolapse    no issues   Osteopenia 2013   T score -1.4 stable from prior DEXA 2007 no FRAX calculated   Paroxysmal atrial fibrillation (HCC)    a. CHA2DS2VASc = 4-->Xarelto.   Pelvic relaxation    Mild and asymptomatic.   PONV (postoperative nausea and vomiting)    "little bit of nausea" after cataract surgery    Past Surgical History:  Procedure Laterality Date   25 GAUGE  PARS PLANA VITRECTOMY WITH 20 GAUGE MVR PORT FOR MACULAR HOLE Left 11/22/2014   Procedure: 25 GAUGE PARS PLANA VITRECTOMY WITH 20 GAUGE MVR PORT FOR MACULAR HOLE LEFT EYE ;  Surgeon: Hayden Pedro, MD;  Location: [place];  Service: Ophthalmology;  Laterality: Left;   BREAST BIOPSY Right 01/2021   CATARACT EXTRACTION, BILATERAL     Dr Herbert Deaner   COLONOSCOPY     DILATION AND CURETTAGE OF UTERUS     PT. DOES NOT REMEMBER THE YEAR   DOPPLER ECHOCARDIOGRAPHY   05/13/2007   EYE SURGERY Bilateral    cataract surgery with lens implant   GAS/FLUID EXCHANGE Left 11/22/2014   Procedure: GAS/FLUID EXCHANGE;  Surgeon: Hayden Pedro, MD;  Location: [place];  Service: Ophthalmology;  Laterality: Left;  C3F8   LASER PHOTO ABLATION Left 11/22/2014   Procedure: LASER PHOTO ABLATION;  Surgeon: Hayden Pedro, MD;  Location: [place];  Service: Ophthalmology;  Laterality: Left;  Headscope laser   MEMBRANE PEEL Left 11/22/2014   Procedure: MEMBRANE PEEL;  Surgeon: Hayden Pedro, MD;  Location: [facility] [facility];  Service: Ophthalmology;  Laterality: Left;   myoview   06/05/2009   SERUM PATCH Left 11/22/2014   Procedure: SERUM PATCH;  Surgeon: Hayden Pedro, MD;  Location: [place];  Service: Ophthalmology;  Laterality: Left;    Current Medications: No outpatient medications have been marked as taking for the 05/28/22 encounter (Appointment) with Darreld Mclean, PA-C.     Allergies:   Penicillins and Prednisone   Social History   Socioeconomic History   Marital status: Married    Spouse name: Not on file   Number of children: Not on file   Years of education: Not on file   Highest education level: Not on file  Occupational History   Not on file  Tobacco Use   Smoking status: Never   Smokeless tobacco: Never  Vaping Use   Vaping Use: Never used  Substance and Sexual Activity   Alcohol use: Yes    Comment: 2 drinks/month   Drug use: No   Sexual activity: Yes    Birth control/protection: Post-menopausal    Comment: 1st intercourse 89 yo-1 partner  Other Topics Concern   Not on file  Social History Narrative   Not on file   Social Determinants of Health   Financial Resource Strain: Not on file  Food Insecurity: Not on file  Transportation Needs: Not on file  Physical Activity: Not on file  Stress: Not on file  Social Connections: Not on file     Family History: The patient's ***family history includes Cancer in her sister; Heart disease in her  brother, brother, father, mother, and sister; Heart failure in her sister; Hyperlipidemia in her mother; Hypertension in her mother; Kidney disease in her mother; Stroke in her brother, brother, mother, and sister. There is no history of Colon cancer or Rectal cancer.  ROS:   Please see the history of present illness.    *** All other systems reviewed and are negative.  EKGs/Labs/Other Studies Reviewed:    The following studies were reviewed today: ***  EKG:  EKG *** ordered today. EKG personally reviewed and demonstrates ***.  Recent Labs: 05/25/2021: ALT 17; BUN 18; BUN 18; Creat 1.09; Creat 1.09; Potassium 3.9; Potassium 3.9; Sodium 137; Sodium 137  Recent Lipid Panel    Component Value Date/Time   CHOL 149 06/17/2014 0930   TRIG 111 06/17/2014 0930   HDL 60 06/17/2014 0930  CHOLHDL 2.7 05/31/2013 1119   VLDL 30 05/31/2013 1119   LDLCALC 67 06/17/2014 0930    Physical Exam:    Vital Signs: There were no vitals taken for this visit.    Wt Readings from Last 3 Encounters:  04/03/22 158 lb 3.2 oz (71.8 kg)  03/14/21 166 lb (75.3 kg)  03/07/21 166 lb (75.3 kg)     General: 82 y.o. female in no acute distress. HEENT: Normocephalic and atraumatic. Sclera clear. EOMs intact. Neck: Supple. No carotid bruits. No JVD. Heart: *** RRR. Distinct S1 and S2. No murmurs, gallops, or rubs. Radial and distal pedal pulses 2+ and equal bilaterally. Lungs: No increased work of breathing. Clear to ausculation bilaterally. No wheezes, rhonchi, or rales.  Abdomen: Soft, non-distended, and non-tender to palpation. Bowel sounds present in all 4 quadrants.  MSK: Normal strength and tone for age. *** Extremities: No lower extremity edema.    Skin: Warm and dry. Neuro: Alert and oriented x3. No focal deficits. Psych: Normal affect. Responds appropriately.   Assessment:    No diagnosis found.  Plan:     Disposition: Follow up in ***   Medication Adjustments/Labs and Tests  Ordered: Current medicines are reviewed at length with the patient today.  Concerns regarding medicines are outlined above.  No orders of the defined types were placed in this encounter.  No orders of the defined types were placed in this encounter.   There are no Patient Instructions on file for this visit.   Signed, Darreld Mclean, PA-C  05/14/2022 10:47 PM    [facility]

## 2022-05-15 ENCOUNTER — Ambulatory Visit: Payer: Medicare Other | Attending: Cardiovascular Disease | Admitting: Cardiovascular Disease

## 2022-05-15 ENCOUNTER — Encounter: Payer: Self-pay | Admitting: Cardiovascular Disease

## 2022-05-15 VITALS — BP 142/72 | HR 52 | Ht 66.0 in | Wt 159.4 lb

## 2022-05-15 DIAGNOSIS — R42 Dizziness and giddiness: Secondary | ICD-10-CM | POA: Diagnosis not present

## 2022-05-15 DIAGNOSIS — E782 Mixed hyperlipidemia: Secondary | ICD-10-CM

## 2022-05-15 DIAGNOSIS — I480 Paroxysmal atrial fibrillation: Secondary | ICD-10-CM

## 2022-05-15 DIAGNOSIS — I10 Essential (primary) hypertension: Secondary | ICD-10-CM

## 2022-05-15 DIAGNOSIS — I2089 Other forms of angina pectoris: Secondary | ICD-10-CM | POA: Diagnosis not present

## 2022-05-15 NOTE — Assessment & Plan Note (Signed)
Recurrent episodes of dizziness without syncope.  I did do an event monitor several years ago that showed short runs of SVT and nonsustained VT.  Should she have recurrent episodes she would be a candidate for a loop recorder implantation.

## 2022-05-15 NOTE — Progress Notes (Signed)
05/15/2022 Brittany Weaver   30-Oct-1940  [id number]  Primary Physician Sharilyn Sites, MD Primary Cardiologist: Lorretta Harp MD [name], [place], [name]  HPI:  Brittany Weaver is a 82 y.o.    thin-appearing, married Caucasian female, mother of 3 who I last saw in the 03/14/2021.Marland Kitchen  She is accompanied by her husband Brittany Weaver and her son Brittany Weaver who is a Barista here in [place].  She has a strong family history of heart disease (father died of an MI at age 35, sister had CABG and brother has had stents)., as well as hypertension, hyperlipidemia and paroxysmal A-fib. She is a retired Loss adjuster, chartered. Her last Myoview performed 3 years ago was nonischemic. She denies chest pain or shortness of breath. She does have occasional episodes of breakthrough A-fib with some dizziness but no presyncope. We will discuss oral anticoagulation which she preferred not to be on which I can't disagree with given her relative infrequency of breakthrough episodes.   She was recently admitted to [facility] with chest pain and hypertension on 05/24/16 at discharge the next day. She ruled out for myocardial infarction. She was bradycardic and her metoprolol was discontinued. 2-D echo was normal. She is under a lot of stress at home raising 2 grandchildren. Her chest pain has since resolved since I saw her in the office 2 weeks ago. A pharmacologic Myoview stress test performed 06/06/16 was nonischemic.   Since I saw her a year ago she continues to do well.  She has had a occasional episodes of dizziness without syncope.  She does also have episodes of left arm pain and back pain but really denies chest pain.  She had a coronary CTA performed 05/09/2022 revealing a coronary calcium score 1737 with what appears to be significant disease in her mid LAD.  Based on this, her family history risk factors we decided to proceed with outpatient diagnostic coronary angiography.  Current Meds  Medication Sig    ALPRAZolam (XANAX) 0.25 MG tablet Take 0.25 mg by mouth 3 (three) times daily as needed.   amLODipine (NORVASC) 2.5 MG tablet TAKE 1 TABLET BY MOUTH  DAILY   Ascorbic Acid (VITAMIN C) 1000 MG tablet Take 1,000 mg by mouth daily.   aspirin 81 MG tablet Take 81 mg by mouth daily.    BIOTIN PO Take by mouth.   Calcium Carb-Cholecalciferol (CALCIUM 1000 + D PO) Take 600 mg by mouth in the morning and at bedtime.   co-enzyme Q-10 30 MG capsule Take 30 mg by mouth daily.   hydrochlorothiazide (HYDRODIURIL) 25 MG tablet Take 25 mg by mouth daily.   lovastatin (MEVACOR) 20 MG tablet TAKE 1 TABLET BY MOUTH ONCE  DAILY AT 6:00 PM   Magnesium 400 MG CAPS Take 400 mg by mouth daily.    potassium chloride (KLOR-CON) 10 MEQ tablet Take 10 mEq by mouth 2 (two) times daily.   vitamin E 400 UNIT capsule Take 400 Units by mouth daily.     Allergies  Allergen Reactions   Penicillins Hives    Has patient had a PCN reaction causing immediate rash, facial/tongue/throat swelling, SOB or lightheadedness with hypotension: unknown Has patient had a PCN reaction causing severe rash involving mucus membranes or skin necrosis: unknown Has patient had a PCN reaction that required hospitalization: no Has patient had a PCN reaction occurring within the last 10 years: no If all of the above answers are "NO", then may proceed with Cephalosporin use.  Prednisone Other (See Comments)    DIZZINESS and swelling    Social History   Socioeconomic History   Marital status: Married    Spouse name: Not on file   Number of children: Not on file   Years of education: Not on file   Highest education level: Not on file  Occupational History   Not on file  Tobacco Use   Smoking status: Never   Smokeless tobacco: Never  Vaping Use   Vaping Use: Never used  Substance and Sexual Activity   Alcohol use: Yes    Comment: 2 drinks/month   Drug use: No   Sexual activity: Yes    Birth control/protection: Post-menopausal     Comment: 1st intercourse 25 yo-1 partner  Other Topics Concern   Not on file  Social History Narrative   Not on file   Social Determinants of Health   Financial Resource Strain: Not on file  Food Insecurity: Not on file  Transportation Needs: Not on file  Physical Activity: Not on file  Stress: Not on file  Social Connections: Not on file  Intimate Partner Violence: Not on file     Review of Systems: General: negative for chills, fever, night sweats or weight changes.  Cardiovascular: negative for chest pain, dyspnea on exertion, edema, orthopnea, palpitations, paroxysmal nocturnal dyspnea or shortness of breath Dermatological: negative for rash Respiratory: negative for cough or wheezing Urologic: negative for hematuria Abdominal: negative for nausea, vomiting, diarrhea, bright red blood per rectum, melena, or hematemesis Neurologic: negative for visual changes, syncope, or dizziness All other systems reviewed and are otherwise negative except as noted above.    Blood pressure (!) 142/72, pulse (!) 52, height '5\' 6"'$  (1.676 m), weight 159 lb 6.4 oz (72.3 kg), SpO2 98 %.  General appearance: alert and no distress Neck: no adenopathy, no carotid bruit, no JVD, supple, symmetrical, trachea midline, and thyroid not enlarged, symmetric, no tenderness/mass/nodules Lungs: clear to auscultation bilaterally Heart: regular rate and rhythm, S1, S2 normal, no murmur, click, rub or gallop Extremities: extremities normal, atraumatic, no cyanosis or edema Pulses: 2+ and symmetric Skin: Skin color, texture, turgor normal. No rashes or lesions Neurologic: Grossly normal  EKG sinus bradycardia 52 with nonspecific ST and T wave changes.  Personally reviewed this EKG.  ASSESSMENT AND PLAN:   Essential hypertension History of essential hypertension blood pressure measured at 142/72.  She is on amlodipine, hydrochlorothiazide and metoprolol.  Hyperlipidemia History of hyperlipidemia on  lovastatin followed by her PCP  Paroxysmal atrial fibrillation (HCC) History of paroxysmal atrial fibrillation past declining oral anticoagulation.  She is in sinus rhythm today.  Dizziness Recurrent episodes of dizziness without syncope.  I did do an event monitor several years ago that showed short runs of SVT and nonsustained VT.  Should she have recurrent episodes she would be a candidate for a loop recorder implantation.  Chest pain History of atypical left arm pain and back pain.  She did have a coronary CTA performed 05/15/2022 revealing a coronary calcium score of 1737 with disease in the mid LAD.  She has a strong family history of heart disease with father that died of MI at age 35, sister that had CABG and a brother who has had stents.  Based on this and her symptoms we decided to proceed with outpatient diagnostic coronary angiography. The patient understands that risks included but are not limited to stroke (1 in 1000), death (1 in 55), kidney failure [usually temporary] (1 in 500), bleeding (  1 in 200), allergic reaction [possibly serious] (1 in 200).  The patient understands and agrees to proceed      Lorretta Harp MD [facility], [facility] 05/15/2022 4:04 PM

## 2022-05-15 NOTE — H&P (View-Only) (Signed)
05/15/2022 Brittany Weaver   [birth year]  [id number]  Primary Physician Sharilyn Sites, MD Primary Cardiologist: Lorretta Harp MD [name], [place], [name]  HPI:  Brittany Weaver is a 82 y.o.    thin-appearing, married Caucasian female, mother of 3 who I last saw in the 03/14/2021.Marland Kitchen  She is accompanied by her husband [name] and her son [name] who is a Barista here in [place].  She has a strong family history of heart disease (father died of an MI at age 110, sister had CABG and brother has had stents)., as well as hypertension, hyperlipidemia and paroxysmal A-fib. She is a retired Loss adjuster, chartered. Her last Myoview performed 3 years ago was nonischemic. She denies chest pain or shortness of breath. She does have occasional episodes of breakthrough A-fib with some dizziness but no presyncope. We will discuss oral anticoagulation which she preferred not to be on which I can't disagree with given her relative infrequency of breakthrough episodes.   She was recently admitted to [facility] with chest pain and hypertension on 05/24/16 at discharge the next day. She ruled out for myocardial infarction. She was bradycardic and her metoprolol was discontinued. 2-D echo was normal. She is under a lot of stress at home raising 2 grandchildren. Her chest pain has since resolved since I saw her in the office 2 weeks ago. A pharmacologic Myoview stress test performed 06/06/16 was nonischemic.   Since I saw her a year ago she continues to do well.  She has had a occasional episodes of dizziness without syncope.  She does also have episodes of left arm pain and back pain but really denies chest pain.  She had a coronary CTA performed 05/09/2022 revealing a coronary calcium score 1737 with what appears to be significant disease in her mid LAD.  Based on this, her family history risk factors we decided to proceed with outpatient diagnostic coronary angiography.  Current Meds  Medication Sig    ALPRAZolam (XANAX) 0.25 MG tablet Take 0.25 mg by mouth 3 (three) times daily as needed.   amLODipine (NORVASC) 2.5 MG tablet TAKE 1 TABLET BY MOUTH  DAILY   Ascorbic Acid (VITAMIN C) 1000 MG tablet Take 1,000 mg by mouth daily.   aspirin 81 MG tablet Take 81 mg by mouth daily.    BIOTIN PO Take by mouth.   Calcium Carb-Cholecalciferol (CALCIUM 1000 + D PO) Take 600 mg by mouth in the morning and at bedtime.   co-enzyme Q-10 30 MG capsule Take 30 mg by mouth daily.   hydrochlorothiazide (HYDRODIURIL) 25 MG tablet Take 25 mg by mouth daily.   lovastatin (MEVACOR) 20 MG tablet TAKE 1 TABLET BY MOUTH ONCE  DAILY AT 6:00 PM   Magnesium 400 MG CAPS Take 400 mg by mouth daily.    potassium chloride (KLOR-CON) 10 MEQ tablet Take 10 mEq by mouth 2 (two) times daily.   vitamin E 400 UNIT capsule Take 400 Units by mouth daily.     Allergies  Allergen Reactions   Penicillins Hives    Has patient had a PCN reaction causing immediate rash, facial/tongue/throat swelling, SOB or lightheadedness with hypotension: unknown Has patient had a PCN reaction causing severe rash involving mucus membranes or skin necrosis: unknown Has patient had a PCN reaction that required hospitalization: no Has patient had a PCN reaction occurring within the last 10 years: no If all of the above answers are "NO", then may proceed with Cephalosporin use.  Prednisone Other (See Comments)    DIZZINESS and swelling    Social History   Socioeconomic History   Marital status: Married    Spouse name: Not on file   Number of children: Not on file   Years of education: Not on file   Highest education level: Not on file  Occupational History   Not on file  Tobacco Use   Smoking status: Never   Smokeless tobacco: Never  Vaping Use   Vaping Use: Never used  Substance and Sexual Activity   Alcohol use: Yes    Comment: 2 drinks/month   Drug use: No   Sexual activity: Yes    Birth control/protection: Post-menopausal     Comment: 1st intercourse 45 yo-1 partner  Other Topics Concern   Not on file  Social History Narrative   Not on file   Social Determinants of Health   Financial Resource Strain: Not on file  Food Insecurity: Not on file  Transportation Needs: Not on file  Physical Activity: Not on file  Stress: Not on file  Social Connections: Not on file  Intimate Partner Violence: Not on file     Review of Systems: General: negative for chills, fever, night sweats or weight changes.  Cardiovascular: negative for chest pain, dyspnea on exertion, edema, orthopnea, palpitations, paroxysmal nocturnal dyspnea or shortness of breath Dermatological: negative for rash Respiratory: negative for cough or wheezing Urologic: negative for hematuria Abdominal: negative for nausea, vomiting, diarrhea, bright red blood per rectum, melena, or hematemesis Neurologic: negative for visual changes, syncope, or dizziness All other systems reviewed and are otherwise negative except as noted above.    Blood pressure (!) 142/72, pulse (!) 52, height '5\' 6"'$  (1.676 m), weight 159 lb 6.4 oz (72.3 kg), SpO2 98 %.  General appearance: alert and no distress Neck: no adenopathy, no carotid bruit, no JVD, supple, symmetrical, trachea midline, and thyroid not enlarged, symmetric, no tenderness/mass/nodules Lungs: clear to auscultation bilaterally Heart: regular rate and rhythm, S1, S2 normal, no murmur, click, rub or gallop Extremities: extremities normal, atraumatic, no cyanosis or edema Pulses: 2+ and symmetric Skin: Skin color, texture, turgor normal. No rashes or lesions Neurologic: Grossly normal  EKG sinus bradycardia 52 with nonspecific ST and T wave changes.  Personally reviewed this EKG.  ASSESSMENT AND PLAN:   Essential hypertension History of essential hypertension blood pressure measured at 142/72.  She is on amlodipine, hydrochlorothiazide and metoprolol.  Hyperlipidemia History of hyperlipidemia on  lovastatin followed by her PCP  Paroxysmal atrial fibrillation (HCC) History of paroxysmal atrial fibrillation past declining oral anticoagulation.  She is in sinus rhythm today.  Dizziness Recurrent episodes of dizziness without syncope.  I did do an event monitor several years ago that showed short runs of SVT and nonsustained VT.  Should she have recurrent episodes she would be a candidate for a loop recorder implantation.  Chest pain History of atypical left arm pain and back pain.  She did have a coronary CTA performed 05/15/2022 revealing a coronary calcium score of 1737 with disease in the mid LAD.  She has a strong family history of heart disease with father that died of MI at age 88, sister that had CABG and a brother who has had stents.  Based on this and her symptoms we decided to proceed with outpatient diagnostic coronary angiography. The patient understands that risks included but are not limited to stroke (1 in 1000), death (1 in 44), kidney failure [usually temporary] (1 in 500), bleeding (  1 in 200), allergic reaction [possibly serious] (1 in 200).  The patient understands and agrees to proceed      Lorretta Harp MD [facility], [facility] 05/15/2022 4:04 PM

## 2022-05-15 NOTE — Assessment & Plan Note (Signed)
History of paroxysmal atrial fibrillation past declining oral anticoagulation.  She is in sinus rhythm today.

## 2022-05-15 NOTE — Assessment & Plan Note (Signed)
History of essential hypertension blood pressure measured at 142/72.  She is on amlodipine, hydrochlorothiazide and metoprolol.

## 2022-05-15 NOTE — Assessment & Plan Note (Signed)
History of hyperlipidemia on lovastatin followed by her PCP 

## 2022-05-15 NOTE — Patient Instructions (Signed)
Medication Instructions:  Your physician recommends that you continue on your current medications as directed. Please refer to the Current Medication list given to you today.  *If you need a refill on your cardiac medications before your next appointment, please call your pharmacy*   Lab Work: Your physician recommends that you have labs drawn today: BMET & CBC   If you have labs (blood work) drawn today and your tests are completely normal, you will receive your results only by: [place] (if you have MyChart) OR A paper copy in the mail If you have any lab test that is abnormal or we need to change your treatment, we will call you to review the results.   Testing/Procedures: See below   Follow-Up: At [facility], you and your health needs are our priority.  As part of our continuing mission to provide you with exceptional heart care, we have created designated Provider Care Teams.  These Care Teams include your primary Cardiologist (physician) and Advanced Practice Providers (APPs -  Physician Assistants and Nurse Practitioners) who all work together to provide you with the care you need, when you need it.  We recommend signing up for the patient portal called "MyChart".  Sign up information is provided on this After Visit Summary.  MyChart is used to connect with patients for Virtual Visits (Telemedicine).  Patients are able to view lab/test results, encounter notes, upcoming appointments, etc.  Non-urgent messages can be sent to your provider as well.   To learn more about what you can do with MyChart, go to [email or web address].    Your next appointment:   1-2 week(s)  Provider:   Quay Burow, MD      Other Instructions       Cardiac/Peripheral Catheterization   You are scheduled for a Cardiac Catheterization on Monday, January 22 with Dr. Quay Burow.  1. Please arrive at the Main Entrance A at [facility]: [place],  [id number] on January 22 at 7:00 AM (This time is two hours before your procedure to ensure your preparation). Free valet parking service is available. You will check in at ADMITTING. The support person will be asked to wait in the waiting room.  It is OK to have someone drop you off and come back when you are ready to be discharged.        Special note: Every effort is made to have your procedure done on time. Please understand that emergencies sometimes delay scheduled procedures.   . 2. Diet: Do not eat solid foods after midnight.  You may have clear liquids until 5 AM the day of the procedure.  3. Labs: You will need to have blood drawn today (1/17)  4. Medication instructions in preparation for your procedure:   Stop taking, HTCZ (Hydrochlorothiazide) Monday, January 22,   On the morning of your procedure, take Aspirin 81 mg and any morning medicines NOT listed above.  You may use sips of water.  5. Plan to go home the same day, you will only stay overnight if medically necessary. 6. You MUST have a responsible adult to drive you home. 7. An adult MUST be with you the first 24 hours after you arrive home. 8. Bring a current list of your medications, and the last time and date medication taken. 9. Bring ID and current insurance cards. 10.Please wear clothes that are easy to get on and off and wear slip-on shoes.  Thank you for allowing [place]  to care for you!   -- [facility]

## 2022-05-15 NOTE — Assessment & Plan Note (Signed)
History of atypical left arm pain and back pain.  She did have a coronary CTA performed 05/15/2022 revealing a coronary calcium score of 1737 with disease in the mid LAD.  She has a strong family history of heart disease with father that died of MI at age 83, sister that had CABG and a brother who has had stents.  Based on this and her symptoms we decided to proceed with outpatient diagnostic coronary angiography. The patient understands that risks included but are not limited to stroke (1 in 1000), death (1 in 63), kidney failure [usually temporary] (1 in 500), bleeding (1 in 200), allergic reaction [possibly serious] (1 in 200).  The patient understands and agrees to proceed

## 2022-05-16 ENCOUNTER — Telehealth: Payer: Self-pay | Admitting: *Deleted

## 2022-05-16 LAB — CBC
Hematocrit: 42 % (ref 34.0–46.6)
Hemoglobin: 14.3 g/dL (ref 11.1–15.9)
MCH: 29.2 pg (ref 26.6–33.0)
MCHC: 34 g/dL (ref 31.5–35.7)
MCV: 86 fL (ref 79–97)
Platelets: 245 10*3/uL (ref 150–450)
RBC: 4.89 x10E6/uL (ref 3.77–5.28)
RDW: 13.3 % (ref 11.7–15.4)
WBC: 9.2 10*3/uL (ref 3.4–10.8)

## 2022-05-16 LAB — BASIC METABOLIC PANEL
BUN/Creatinine Ratio: 16 (ref 12–28)
BUN: 16 mg/dL (ref 8–27)
CO2: 23 mmol/L (ref 20–29)
Calcium: 10.9 mg/dL — ABNORMAL HIGH (ref 8.7–10.3)
Chloride: 98 mmol/L (ref 96–106)
Creatinine, Ser: 1.02 mg/dL — ABNORMAL HIGH (ref 0.57–1.00)
Glucose: 93 mg/dL (ref 70–99)
Potassium: 4.1 mmol/L (ref 3.5–5.2)
Sodium: 137 mmol/L (ref 134–144)
eGFR: 55 mL/min/{1.73_m2} — ABNORMAL LOW (ref >59)

## 2022-05-16 NOTE — Telephone Encounter (Addendum)
Cardiac Catheterization scheduled at [facility] for: Monday May 20, 2022 9 AM Arrival time and place: [place] Entrance A at: 7 AM  Nothing to eat after midnight prior to procedure, clear liquids until 5 AM day of procedure.   Medication instructions: -Hold:  HCTZ/KCl-day before and day of procedure-per protocol GFR 55 -Except hold medications usual morning medications can be taken with sips of water including aspirin 81 mg.  Confirmed patient has responsible adult to drive home post procedure and be with patient first 24 hours after arriving home.  Patient reports no new symptoms concerning for COVID-19 in the past 10 days.  Reviewed procedure instructions with patient.

## 2022-05-20 ENCOUNTER — Telehealth: Payer: Self-pay

## 2022-05-20 ENCOUNTER — Other Ambulatory Visit: Payer: Self-pay

## 2022-05-20 ENCOUNTER — Encounter (HOSPITAL_COMMUNITY)
Admission: RE | Disposition: A | Discharge status: Home / Self Care | Payer: Self-pay | Source: Ambulatory Visit | Attending: Cardiovascular Disease

## 2022-05-20 ENCOUNTER — Other Ambulatory Visit: Payer: Self-pay | Admitting: Physician Assistant

## 2022-05-20 ENCOUNTER — Other Ambulatory Visit (HOSPITAL_COMMUNITY): Payer: Self-pay

## 2022-05-20 ENCOUNTER — Ambulatory Visit (HOSPITAL_COMMUNITY): Payer: Medicare Other

## 2022-05-20 ENCOUNTER — Telehealth: Payer: Self-pay | Admitting: Physician Assistant

## 2022-05-20 ENCOUNTER — Ambulatory Visit (HOSPITAL_COMMUNITY)
Admission: RE | Admit: 2022-05-20 | Discharge: 2022-05-20 | Disposition: A | Discharge status: Home / Self Care | Payer: Medicare Other | Source: Ambulatory Visit | Attending: Cardiovascular Disease | Admitting: Cardiovascular Disease

## 2022-05-20 DIAGNOSIS — I129 Hypertensive chronic kidney disease with stage 1 through stage 4 chronic kidney disease, or unspecified chronic kidney disease: Secondary | ICD-10-CM | POA: Diagnosis not present

## 2022-05-20 DIAGNOSIS — I25110 Atherosclerotic heart disease of native coronary artery with unstable angina pectoris: Secondary | ICD-10-CM | POA: Diagnosis not present

## 2022-05-20 DIAGNOSIS — E785 Hyperlipidemia, unspecified: Secondary | ICD-10-CM | POA: Diagnosis present

## 2022-05-20 DIAGNOSIS — Z8249 Family history of ischemic heart disease and other diseases of the circulatory system: Secondary | ICD-10-CM | POA: Diagnosis not present

## 2022-05-20 DIAGNOSIS — N1831 Chronic kidney disease, stage 3a: Secondary | ICD-10-CM | POA: Insufficient documentation

## 2022-05-20 DIAGNOSIS — Z955 Presence of coronary angioplasty implant and graft: Secondary | ICD-10-CM

## 2022-05-20 DIAGNOSIS — I2584 Coronary atherosclerosis due to calcified coronary lesion: Secondary | ICD-10-CM | POA: Insufficient documentation

## 2022-05-20 DIAGNOSIS — I480 Paroxysmal atrial fibrillation: Secondary | ICD-10-CM | POA: Diagnosis not present

## 2022-05-20 DIAGNOSIS — Z79899 Other long term (current) drug therapy: Secondary | ICD-10-CM | POA: Diagnosis not present

## 2022-05-20 DIAGNOSIS — R001 Bradycardia, unspecified: Secondary | ICD-10-CM | POA: Diagnosis not present

## 2022-05-20 DIAGNOSIS — I2510 Atherosclerotic heart disease of native coronary artery without angina pectoris: Secondary | ICD-10-CM

## 2022-05-20 DIAGNOSIS — E876 Hypokalemia: Secondary | ICD-10-CM | POA: Diagnosis not present

## 2022-05-20 DIAGNOSIS — I10 Essential (primary) hypertension: Secondary | ICD-10-CM | POA: Diagnosis present

## 2022-05-20 HISTORY — PX: LEFT HEART CATH AND CORONARY ANGIOGRAPHY: CATH118249

## 2022-05-20 HISTORY — PX: CORONARY STENT INTERVENTION: CATH118234

## 2022-05-20 LAB — BASIC METABOLIC PANEL
Anion gap: 9 (ref 5–15)
BUN: 16 mg/dL (ref 8–23)
CO2: 25 mmol/L (ref 22–32)
Calcium: 9.6 mg/dL (ref 8.9–10.3)
Chloride: 102 mmol/L (ref 98–111)
Creatinine, Ser: 0.93 mg/dL (ref 0.44–1.00)
GFR, Estimated: >60 mL/min (ref >60)
Glucose, Bld: 105 mg/dL — ABNORMAL HIGH (ref 70–99)
Potassium: 3.4 mmol/L — ABNORMAL LOW (ref 3.5–5.1)
Sodium: 136 mmol/L (ref 135–145)

## 2022-05-20 LAB — POCT ACTIVATED CLOTTING TIME: Activated Clotting Time: 331 seconds

## 2022-05-20 SURGERY — LEFT HEART CATH AND CORONARY ANGIOGRAPHY
Anesthesia: LOCAL

## 2022-05-20 MED ORDER — SODIUM CHLORIDE 0.9% FLUSH: 3.0000 mL | INTRAVENOUS | Status: DC | PRN

## 2022-05-20 MED ORDER — ASPIRIN 81 MG PO TBEC: 81.0000 mg | DELAYED_RELEASE_TABLET | Freq: Every day | ORAL | 0 refills | Status: DC

## 2022-05-20 MED ORDER — MORPHINE SULFATE (PF) 2 MG/ML IV SOLN: 2.0000 mg | INTRAVENOUS | Status: DC | PRN

## 2022-05-20 MED ORDER — ASPIRIN 81 MG PO CHEW: 81.0000 mg | CHEWABLE_TABLET | Freq: Every day | ORAL | Status: DC

## 2022-05-20 MED ORDER — CLOPIDOGREL BISULFATE 75 MG PO TABS: 75.0000 mg | ORAL_TABLET | Freq: Every day | ORAL | 10 refills | Status: DC

## 2022-05-20 MED ORDER — FENTANYL CITRATE (PF) 100 MCG/2ML IJ SOLN
INTRAMUSCULAR | Status: DC | PRN
  Administered 2022-05-20: 25 ug via INTRAVENOUS

## 2022-05-20 MED ORDER — NITROGLYCERIN 0.4 MG SL SUBL
0.4000 mg | SUBLINGUAL_TABLET | SUBLINGUAL | 3 refills | Status: DC | PRN
  Filled 2022-05-20: qty 25, 10d supply, fill #0

## 2022-05-20 MED ORDER — FENTANYL CITRATE (PF) 100 MCG/2ML IJ SOLN
INTRAMUSCULAR | Status: AC
  Filled 2022-05-20: qty 2

## 2022-05-20 MED ORDER — LABETALOL HCL 5 MG/ML IV SOLN: 10.0000 mg | INTRAVENOUS | Status: DC | PRN

## 2022-05-20 MED ORDER — CLOPIDOGREL BISULFATE 300 MG PO TABS
ORAL_TABLET | ORAL | Status: AC
  Filled 2022-05-20: qty 2

## 2022-05-20 MED ORDER — LIDOCAINE HCL (PF) 1 % IJ SOLN
INTRAMUSCULAR | Status: AC
  Filled 2022-05-20: qty 30

## 2022-05-20 MED ORDER — IOHEXOL 350 MG/ML SOLN
INTRAVENOUS | Status: DC | PRN
  Administered 2022-05-20: 105 mL

## 2022-05-20 MED ORDER — SODIUM CHLORIDE 0.9 % WEIGHT BASED INFUSION
3.0000 mL/kg/h | INTRAVENOUS | Status: AC
  Administered 2022-05-20: 3 mL/kg/h via INTRAVENOUS

## 2022-05-20 MED ORDER — CLOPIDOGREL BISULFATE 75 MG PO TABS: 75.0000 mg | ORAL_TABLET | Freq: Every day | ORAL | 0 refills | Status: DC

## 2022-05-20 MED ORDER — ONDANSETRON HCL 4 MG/2ML IJ SOLN: 4.0000 mg | Freq: Four times a day (QID) | INTRAMUSCULAR | Status: DC | PRN

## 2022-05-20 MED ORDER — CLOPIDOGREL BISULFATE 300 MG PO TABS
ORAL_TABLET | ORAL | Status: DC | PRN
  Administered 2022-05-20: 300 mg via ORAL

## 2022-05-20 MED ORDER — SODIUM CHLORIDE 0.9 % IV SOLN: 250.0000 mL | INTRAVENOUS | Status: DC | PRN

## 2022-05-20 MED ORDER — HYDRALAZINE HCL 20 MG/ML IJ SOLN: 10.0000 mg | INTRAMUSCULAR | Status: DC | PRN

## 2022-05-20 MED ORDER — SODIUM CHLORIDE 0.9 % WEIGHT BASED INFUSION: 1.0000 mL/kg/h | INTRAVENOUS | Status: DC

## 2022-05-20 MED ORDER — HEPARIN (PORCINE) IN NACL 1000-0.9 UT/500ML-% IV SOLN
INTRAVENOUS | Status: DC | PRN
  Administered 2022-05-20 (×2): 500 mL

## 2022-05-20 MED ORDER — VERAPAMIL HCL 2.5 MG/ML IV SOLN
INTRAVENOUS | Status: AC
  Filled 2022-05-20: qty 2

## 2022-05-20 MED ORDER — SODIUM CHLORIDE 0.9% FLUSH: 3.0000 mL | Freq: Two times a day (BID) | INTRAVENOUS | Status: DC

## 2022-05-20 MED ORDER — HEPARIN (PORCINE) IN NACL 1000-0.9 UT/500ML-% IV SOLN
INTRAVENOUS | Status: AC
  Filled 2022-05-20: qty 500

## 2022-05-20 MED ORDER — POTASSIUM CHLORIDE CRYS ER 20 MEQ PO TBCR
40.0000 meq | EXTENDED_RELEASE_TABLET | Freq: Once | ORAL | Status: AC
  Administered 2022-05-20: 40 meq via ORAL
  Filled 2022-05-20: qty 2

## 2022-05-20 MED ORDER — LIDOCAINE HCL (PF) 1 % IJ SOLN
INTRAMUSCULAR | Status: DC | PRN
  Administered 2022-05-20: 2 mL via INTRADERMAL

## 2022-05-20 MED ORDER — ASPIRIN 81 MG PO CHEW
81.0000 mg | CHEWABLE_TABLET | ORAL | Status: AC
  Administered 2022-05-20: 81 mg via ORAL

## 2022-05-20 MED ORDER — HEPARIN SODIUM (PORCINE) 1000 UNIT/ML IJ SOLN
INTRAMUSCULAR | Status: AC
  Filled 2022-05-20: qty 10

## 2022-05-20 MED ORDER — VERAPAMIL HCL 2.5 MG/ML IV SOLN
INTRA_ARTERIAL | Status: DC | PRN
  Administered 2022-05-20: 5 mL via INTRA_ARTERIAL

## 2022-05-20 MED ORDER — MIDAZOLAM HCL 2 MG/2ML IJ SOLN
INTRAMUSCULAR | Status: AC
  Filled 2022-05-20: qty 2

## 2022-05-20 MED ORDER — CLOPIDOGREL BISULFATE 75 MG PO TABS
75.0000 mg | ORAL_TABLET | Freq: Every day | ORAL | 11 refills | Status: DC
  Filled 2022-05-20: qty 30, 30d supply, fill #0

## 2022-05-20 MED ORDER — MIDAZOLAM HCL 2 MG/2ML IJ SOLN
INTRAMUSCULAR | Status: DC | PRN
  Administered 2022-05-20: 1 mg via INTRAVENOUS

## 2022-05-20 MED ORDER — ACETAMINOPHEN 325 MG PO TABS: 650.0000 mg | ORAL_TABLET | ORAL | Status: DC | PRN

## 2022-05-20 MED ORDER — SODIUM CHLORIDE 0.9 % IV SOLN: INTRAVENOUS | Status: DC

## 2022-05-20 MED ORDER — HEPARIN SODIUM (PORCINE) 1000 UNIT/ML IJ SOLN
INTRAMUSCULAR | Status: DC | PRN
  Administered 2022-05-20: 4000 [IU] via INTRAVENOUS
  Administered 2022-05-20: 3500 [IU] via INTRAVENOUS

## 2022-05-20 MED ORDER — CLOPIDOGREL BISULFATE 75 MG PO TABS: 75.0000 mg | ORAL_TABLET | Freq: Every day | ORAL | Status: DC

## 2022-05-20 MED ORDER — NITROGLYCERIN 1 MG/10 ML FOR IR/CATH LAB
INTRA_ARTERIAL | Status: AC
  Filled 2022-05-20: qty 10

## 2022-05-20 SURGICAL SUPPLY — 21 items
BAG SNAP BAND KOVER 36X36 (MISCELLANEOUS) IMPLANT
BALLN EMERGE MR 2.0X12 (BALLOONS) ×1
BALLN ~~LOC~~ EMERGE MR 2.5X15 (BALLOONS) ×1
BALLOON EMERGE MR 2.0X12 (BALLOONS) IMPLANT
BALLOON ~~LOC~~ EMERGE MR 2.5X15 (BALLOONS) IMPLANT
CATH 5FR JL3.5 JR4 ANG PIG MP (CATHETERS) IMPLANT
CATH OPTITORQUE TIG 4.0 5F (CATHETERS) IMPLANT
CATH VISTA GUIDE 6FR XBLAD3.5 (CATHETERS) IMPLANT
DEVICE RAD COMP TR BAND LRG (VASCULAR PRODUCTS) IMPLANT
GLIDESHEATH SLEND A-KIT 6F 22G (SHEATH) IMPLANT
GUIDEWIRE INQWIRE 1.5J.035X260 (WIRE) IMPLANT
INQWIRE 1.5J .035X260CM (WIRE) ×1
KIT ENCORE 26 ADVANTAGE (KITS) IMPLANT
KIT HEART LEFT (KITS) ×1 IMPLANT
PACK CARDIAC CATHETERIZATION (CUSTOM PROCEDURE TRAY) ×1 IMPLANT
STENT ONYX FRONTIER 2.25X18 (Permanent Stent) IMPLANT
SYR MEDRAD MARK 7 150ML (SYRINGE) ×1 IMPLANT
TRANSDUCER W/STOPCOCK (MISCELLANEOUS) ×1 IMPLANT
TUBING CIL FLEX 10 FLL-RA (TUBING) ×1 IMPLANT
WIRE ASAHI PROWATER 180CM (WIRE) IMPLANT
WIRE HI TORQ VERSACORE-J 145CM (WIRE) IMPLANT

## 2022-05-20 NOTE — Progress Notes (Signed)
2D echo order per Dr. Gwenlyn Found

## 2022-05-20 NOTE — Discharge Summary (Addendum)
Discharge Summary for Same Day PCI   Patient ID: Brittany Weaver MRN: [id number]; DOB: June 25, 1940  Admit date: 05/20/2022 Discharge date: 05/20/2022  Primary Care Provider: Sharilyn Sites, MD  Primary Cardiologist: Quay Burow, MD  Primary Electrophysiologist:  None   Discharge Diagnoses    Principal Problem:   Coronary artery disease involving native coronary artery of native heart with unstable angina pectoris [facility]) Active Problems:   Essential hypertension   Hyperlipidemia   Paroxysmal atrial fibrillation (HCC)   Sinus bradycardia   Chronic kidney disease, stage 3a ([place])   Hypokalemia    Diagnostic Studies/Procedures    Cardiac Catheterization 05/20/2022:  PROCEDURE DESCRIPTION:    The patient was brought to the second floor [facility]iac cath lab in the postabsorptive state.  She was premedicated with IV Versed and fentanyl.  Her right wrist was prepped and shaved in usual sterile fashion. Xylocaine 1% was used for local anesthesia. A 6 French sheath was inserted into the right radial artery using standard Seldinger technique. The patient received 3500 units  of heparin intravenously.  A 5 [place] TIG catheter and right Judkins catheters were used for selective coronary angiography and left ventriculography respectively.  Isovue dye is used for the entirety of the case (105 cc total contrast the patient).  Retrograde aortic, ventricular and pullback pressures were recorded.  LVEDP was measured at 15.   The patient received an additional 4000's of heparin with an ACT of 331.  Total heparin was 7500 units.  She received clopidogrel 600 mg p.o.  Isovue dye is used for the entirety of the intervention.  Retrograde aortic pressures monitored in the case.   Using a 6 [place] XB LAD 3.0 cm guide catheter along the 0.14 Prowater guidewire and a 2 mm x 12 m balloon was able to cross the mid LAD with little difficulty.  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  The LAD was diffusely fluoroscopically calcified.  I then carefully placed a 2.25 x 18 mm long Medtronic Onyx frontier drug-eluting stent across the diseased segment and deployed at 14 atm.  I postdilated with a 2.5 x 15 mm long noncompliant balloon at 16 atm (2.61 mm) resulting in reduction of a 80% segmental mid LAD stenosis to 0% residual.  The patient did have residual ostial second diagonal branch stenosis which is calcified but I did not intervene on.     IMPRESSION: Successful PCI and drug-eluting stenting of high-grade physiologically significant mid LAD lesion with setting up unstable angina.  The patient does have normal LV function.  She has residual diagonal branch disease.  She will need to be on DAPT with aspirin and clopidogrel uninterrupted for at least 12 months.  The sheath was removed and a TR band was placed on the right wrist to achieve patent hemostasis.  The patient left lab in stable condition.  She will be hydrated over the next 6 hours will be discharged home if she remains clinically stable.  I will see her back in the office in 1 week for follow-up.   Quay Burow. MD, [facility] 05/20/2022 9:37 AM      Mid LAD lesion is 80% stenosed.   2nd Diag lesion is 80% stenosed.   Prox LAD lesion is 35% stenosed.   A drug-eluting stent was successfully placed using a STENT ONYX FRONTIER 2.25X18.   Post intervention, there is a 0% residual stenosis.   The  left ventricular systolic function is normal.   LV end diastolic pressure is normal.   The left ventricular ejection fraction is 55-65% by visual estimate. _____________   History of Present Illness     Brittany Weaver is a 82 y.o. female with HTN, HLD followed by primary care, PAF (not on anticoagulation per pt choice), prior reported MVP, CKD 3a by labs, baseline sinus bradycardia who was recently seen in the office with occasional episodes of dizziness without syncope, left arm pain  and chest pain. She underwent cor CTA that was abnormal with CAC 1737 with positive FFR in the mid to distal LAD. Cardiac catheterization was arranged for further evaluation.  Hospital Course     The patient underwent cardiac cath as noted above with resultant DES to mid LAD. She had normal LV function by LV gram. Plan for DAPT with ASA/Plavix for at least 12 months. The patient was seen by cardiac rehab while in short stay. There were no observed complications post cath. Radial cath site was re-evaluated prior to discharge and found to be stable without any complications. Instructions/precautions regarding cath site care were given prior to discharge.  [name] was seen by Dr. Gwenlyn Found and determined stable for discharge home. Follow up with our office has been arranged. Medications are listed below. Pertinent changes include addition of Plavix and PRN SL NTG (already on ASA as OP). Dr. Gwenlyn Found recommended to defer decision of statin adjustment to the outpatient setting as this has been followed by primary care. Recommend attention to this at f/u visit, scheduled 05/29/22. OP 2D echo was scheduled for 06/03/22. Of note, her BMET was rechecked day of cath due to prior value showing hypercalcemia - the calcium level had normalized but K was 3.4 so she received 43mq KCl x1. Since prior OP K was 4.1, additional repletion was not prescribed, but consider repeat BMET at time of follow-up. She was also advised to eat potassium rich foods.    _____________  Cath/PCI Registry Performance & Quality Measures: Aspirin prescribed? - Yes ADP Receptor Inhibitor (Plavix/Clopidogrel, Brilinta/Ticagrelor or Effient/Prasugrel) prescribed (includes medically managed patients)? - Yes High Intensity Statin (Lipitor 40-'80mg'$  or Crestor 20-'40mg'$ ) prescribed? - No - Dr. BGwenlyn Foundrecommended to defer to outpatient setting For EF <40%, was ACEI/ARB prescribed? - Not Applicable (EF >/= 453% For EF <40%, Aldosterone Antagonist  (Spironolactone or Eplerenone) prescribed? - Not Applicable (EF >/= 466% Cardiac Rehab Phase II ordered (Included Medically managed Patients)? - Yes  _____________   Discharge Vitals Blood pressure 143/76 (rechecked after 99/78 felt to be cuff error), pulse 66, temperature (!) 97.1 F (36.2 C), temperature source Temporal, resp. rate 17, height '5\' 6"'$  (1.676 m), weight 72.1 kg, SpO2 99 %.  Filed Weights   05/20/22 0745  Weight: 72.1 kg    Last Labs & Radiologic Studies    Basic Metabolic Panel Recent Labs    05/20/22 1004  NA 136  K 3.4*  CL 102  CO2 25  GLUCOSE 105*  BUN 16  CREATININE 0.93  CALCIUM 9.6    _____________  CARDIAC CATHETERIZATION  Result Date: 05/20/2022 Images from the original result were not included.   Mid LAD lesion is 80% stenosed.   2nd Diag lesion is 80% stenosed.   Prox LAD lesion is 35% stenosed.   A drug-eluting stent was successfully placed using a STENT ONYX FRONTIER 2.25X18.   Post intervention, there is a 0% residual stenosis.   The left ventricular systolic function is normal.  LV end diastolic pressure is normal.   The left ventricular ejection fraction is 55-65% by visual estimate. DAVITA SUBLETT is a 82 y.o. female  [id number] LOCATION:  FACILITY: [facility] PHYSICIAN: Quay Burow, M.D. [birth year] DATE OF PROCEDURE:  05/20/2022 DATE OF DISCHARGE: CARDIAC CATHETERIZATION / PCI DES Mid LAD History obtained from chart review.ERIYANNA KOFOED is a 82 y.o.    thin-appearing, married Caucasian female, mother of 3 who I last saw in the 03/14/2021.Marland Kitchen  She is accompanied by her husband [name] and her son [name] who is a Barista here in [place].  She has a strong family history of heart disease (father died of an MI at age 31, sister had CABG and brother has had stents)., as well as hypertension, hyperlipidemia and paroxysmal A-fib. She is a retired Loss adjuster, chartered. Her last Myoview performed 3 years ago was nonischemic. She denies chest pain or shortness  of breath. She does have occasional episodes of breakthrough A-fib with some dizziness but no presyncope. We will discuss oral anticoagulation which she preferred not to be on which I can't disagree with given her relative infrequency of breakthrough episodes.  She was recently admitted to [facility] with chest pain and hypertension on 05/24/16 at discharge the next day. She ruled out for myocardial infarction. She was bradycardic and her metoprolol was discontinued. 2-D echo was normal. She is under a lot of stress at home raising 2 grandchildren. Her chest pain has since resolved since I saw her in the office 2 weeks ago. A pharmacologic Myoview stress test performed 06/06/16 was nonischemic.  Since I saw her a year ago she continues to do well.  She has had a occasional episodes of dizziness without syncope.  She does also have episodes of left arm pain and back pain but really denies chest pain.  She had a coronary CTA performed 05/09/2022 revealing a coronary calcium score 1737 with what appears to be significant disease in her mid LAD.  Based on this, her family history risk factors we decided to proceed with outpatient diagnostic coronary angiography. PROCEDURE DESCRIPTION: The patient was brought to the second floor [facility]iac cath lab in the postabsorptive state.  She was premedicated with IV Versed and fentanyl.  Her right wrist was prepped and shaved in usual sterile fashion. Xylocaine 1% was used for local anesthesia. A 6 French sheath was inserted into the right radial artery using standard Seldinger technique. The patient received 3500 units  of heparin intravenously.  A 5 [place] TIG catheter and right Judkins catheters were used for selective coronary angiography and left ventriculography respectively.  Isovue dye is used for the entirety of the case (105 cc total contrast the patient).  Retrograde aortic, ventricular and pullback pressures were recorded.  LVEDP was measured at 15. The  patient received an additional 4000's of heparin with an ACT of 331.  Total heparin was 7500 units.  She received clopidogrel 600 mg p.o.  Isovue dye is used for the entirety of the intervention.  Retrograde aortic pressures monitored in the case. Using a 6 [place] XB LAD 3.0 cm guide catheter along the 0.14 Prowater guidewire and a 2 mm x 12 m balloon was able to cross the mid LAD with little difficulty.  It should be noted the patient had a anomalous left circumflex arising from the origin of the dominant RCA.  The LAD was diffusely fluoroscopically calcified.  I then carefully placed a 2.25 x 18 mm long Medtronic Onyx  frontier drug-eluting stent across the diseased segment and deployed at 14 atm.  I postdilated with a 2.5 x 15 mm long noncompliant balloon at 16 atm (2.61 mm) resulting in reduction of a 80% segmental mid LAD stenosis to 0% residual.  The patient did have residual ostial second diagonal branch stenosis which is calcified but I did not intervene on.   Successful PCI and drug-eluting stenting of high-grade physiologically significant mid LAD lesion with setting up unstable angina.  The patient does have normal LV function.  She has residual diagonal branch disease.  She will need to be on DAPT with aspirin and clopidogrel uninterrupted for at least 12 months.  The sheath was removed and a TR band was placed on the right wrist to achieve patent hemostasis.  The patient left lab in stable condition.  She will be hydrated over the next 6 hours will be discharged home if she remains clinically stable.  I will see her back in the office in 1 week for follow-up. Quay Burow. MD, [facility] 05/20/2022 9:37 AM    CT CORONARY FFR DATA PREP & FLUID ANALYSIS  Result Date: 05/09/2022 EXAM: FFRCT ANALYSIS FINDINGS: FFRct analysis was performed on the original cardiac CT angiogram dataset. Diagrammatic representation of the FFRct analysis is provided in a separate PDF document in PACS. This dictation was created  using the PDF document and an interactive 3D model of the results. 3D model is not available in the EMR/PACS. Normal FFR range is >0.80. 1. LAD: Possible significant stenosis. Proximal FFR = 0.99, Mid FFR = 0.90, Distal FFR = 0.67. 2. LCX: No significant stenosis. Proximal FFR = 0.98, Mid FFR = 0.87, Distal FFR = 0.82. 3. RCA: No significant stenosis in the proximal and mid RCA. Proximal FFR = 0.99, Mid FFR = 0.86. The Distal RCA and PDA were not modeled. IMPRESSION: 1. Coronary CTA FFR flow analysis demonstrates a possible hemodynamically flow limiting lesion in the mid to distal LAD which corresponds with moderate mid LAD stenosis noted on CT images. 2.  Recommend Cardiac Catheterization. Fransico Him, MD Electronically Signed   By: Fransico Him M.D.   On: 05/09/2022 10:57   CT CORONARY MORPH W/CTA COR W/SCORE W/CA W/CM &/OR WO/CM  Addendum Date: 05/08/2022   ADDENDUM REPORT: 05/08/2022 09:11 EXAM: OVER-READ INTERPRETATION  CT CHEST The following report is an over-read performed by radiologist Dr. Alvino Blood [facility] on 05/08/2022. This over-read does not include interpretation of cardiac or coronary anatomy or pathology. The CTA interpretation by the cardiologist is attached. COMPARISON:  None. FINDINGS: Limited view of the lung parenchyma demonstrates no suspicious nodularity. Airways are normal. Limited view of the mediastinum demonstrates no adenopathy. Esophagus normal. Limited view of the upper abdomen unremarkable. Limited view of the skeleton and chest wall is unremarkable. IMPRESSION: No significant extracardiac findings. Electronically Signed   By: Suzy Bouchard M.D.   On: 05/08/2022 09:11   Result Date: 05/08/2022 CLINICAL DATA:  Chest pain EXAM: Cardiac/Coronary CTA TECHNIQUE: A non-contrast, gated CT scan was obtained with axial slices of 3 mm through the heart for calcium scoring. Calcium scoring was performed using the Agatston method. A 120 kV prospective, gated,  contrast cardiac scan was obtained. Gantry rotation speed was 250 msecs and collimation was 0.6 mm. Two sublingual nitroglycerin tablets (0.8 mg) were given. The 3D data set was reconstructed in 5% intervals of the 35-75% of the R-R cycle. Diastolic phases were analyzed on a dedicated workstation using MPR, MIP, and VRT modes.  The patient received 95 cc of contrast. FINDINGS: Image quality: Excellent. Noise artifact is: Limited. Coronary Arteries: Aberrant takeoff of LCx from the ostium of the RCA and traverses posteriorly between the left atrium and aorta. Right dominance. Left main: There is no left main as the LCx comes off the RCA. The LAD directed comes off the left coronary cusp. Left anterior descending artery: The LAD gives off 3 patent diagonal branches. There is mild to moderate calcified plaque in the proximal and mid LAD with associated stenosis of at least 25-49% but up to 50-69% and involves all 3 diagonals. This may be overestimated in setting of significant blooming artifact. Left circumflex artery: The LCX is non-dominant with an aberrant take-off from the ostium of the RCA and traverses posteriorly between the Left atrium and aorta and patent with no evidence of plaque or stenosis. The LCX gives off 1 patent obtuse marginal branch. Right coronary artery: The RCA is dominant with normal take off from the right coronary cusp and immediately gives rise the an aberrant LCx artery. There is mild calcified plaque in the proximal to mid RCA with associated stenosis of 25-49%. The RCA terminates as a PDA and right posterolateral branch without evidence of plaque or stenosis. Right Atrium: Right atrial size is within normal limits. Right Ventricle: The right ventricular cavity is within normal limits. Left Atrium: Left atrial size is normal in size with no left atrial appendage filling defect. Left Ventricle: The ventricular cavity size is within normal limits. There are no stigmata of prior infarction.  There is no abnormal filling defect. Pulmonary arteries: Normal in size without proximal filling defect. Pulmonary veins: Normal pulmonary venous drainage. Pericardium: Normal thickness with no significant effusion or calcium present. Cardiac valves: The aortic valve is trileaflet without significant calcification. The mitral valve is normal structure without significant calcification. Aorta: Normal caliber with scattered calcifications. Extra-cardiac findings: See attached radiology report for non-cardiac structures. IMPRESSION: 1. Coronary calcium score of 1737. This was 95th percentile for age-, sex, and race-matched controls. 2. Low risk aberrant takeoff of LCx from the RCA ostium traversing posteriorly between the left atrium and aorta with right dominance. 3.  Moderate atherosclerosis of the LAD.  CAD RADS 3. 4. Consider symptom-guided anti-ischemic and preventive pharmacotherapy as well as risk factor modification per guideline-directed care. 5.  This study has been submitted for FFR flow analysis. RECOMMENDATIONS: 1. CAD-RADS 0: No evidence of CAD (0%). Consider non-atherosclerotic causes of chest pain. 2. CAD-RADS 1: Minimal non-obstructive CAD (0-24%). Consider non-atherosclerotic causes of chest pain. Consider preventive therapy and risk factor modification. 3. CAD-RADS 2: Mild non-obstructive CAD (25-49%). Consider non-atherosclerotic causes of chest pain. Consider preventive therapy and risk factor modification. 4. CAD-RADS 3: Moderate stenosis. Consider symptom-guided anti-ischemic pharmacotherapy as well as risk factor modification per guideline directed care. Additional analysis with CT FFR will be submitted. 5. CAD-RADS 4: Severe stenosis. (70-99% or > 50% left main). Cardiac catheterization or CT FFR is recommended. Consider symptom-guided anti-ischemic pharmacotherapy as well as risk factor modification per guideline directed care. Invasive coronary angiography recommended with revascularization  per published guideline statements. 6. CAD-RADS 5: Total coronary occlusion (100%). Consider cardiac catheterization or viability assessment. Consider symptom-guided anti-ischemic pharmacotherapy as well as risk factor modification per guideline directed care. 7. CAD-RADS N: Non-diagnostic study. Obstructive CAD can't be excluded. Alternative evaluation is recommended. Fransico Him, MD Electronically Signed: By: Fransico Him M.D. On: 05/07/2022 19:58    Disposition   Pt is being discharged home today in  good condition.  Follow-up Plans & Appointments     Follow-up Information     Lorretta Harp, MD Follow up.   Specialties: Cardiology, Radiology Why: [facility] location - cardiology f/u arranged Wednesday May 29, 2022 9:30 AM (Arrive by 9:15 AM). Contact information: [address] [place] [address] [phone]         [facility] Follow up.   Specialty: Cardiology Why: You have a heart ultrasound scheduled on Monday Jun 03, 2022 at 2:00 PM. Please arrive 15 minutes prior to appointment time to check in. This will take place at [facility] at [facility] Contact information: [place] [address] [phone]               Discharge Instructions     Amb Referral to Cardiac Rehabilitation   Complete by: As directed    Diagnosis: Coronary Stents   After initial evaluation and assessments completed: Virtual Based Care may be provided alone or in conjunction with Phase 2 Cardiac Rehab based on patient barriers.: Yes   Intensive Cardiac Rehabilitation (ICR) [place] location only OR Traditional Cardiac Rehabilitation (TCR) *If criteria for ICR are not met will enroll in TCR [facility] only): Yes   Diet - low sodium heart healthy   Complete by: As directed    Increase activity slowly   Complete by: As directed    No driving for 3 days. No  lifting over 5 lbs for 1 week. No sexual activity for 1 week. Keep procedure site clean & dry. If you notice increased pain, swelling, bleeding or pus, call/return!  You may shower, but no soaking baths/hot tubs/pools for 1 week.        Discharge Medications   Allergies as of 05/20/2022       Reactions   Penicillins Hives   Has patient had a PCN reaction causing immediate rash, facial/tongue/throat swelling, SOB or lightheadedness with hypotension: unknown Has patient had a PCN reaction causing severe rash involving mucus membranes or skin necrosis: unknown Has patient had a PCN reaction that required hospitalization: no Has patient had a PCN reaction occurring within the last 10 years: no If all of the above answers are "NO", then may proceed with Cephalosporin use.   Prednisone Other (See Comments)   DIZZINESS and swelling        Medication List     TAKE these medications    acetaminophen 500 MG tablet Commonly known as: TYLENOL Take 500-1,000 mg by mouth every 6 (six) hours as needed (pain.).   ALPRAZolam 0.25 MG tablet Commonly known as: XANAX Take 0.25 mg by mouth daily as needed (when a car passenger).   amLODipine 2.5 MG tablet Commonly known as: NORVASC TAKE 1 TABLET BY MOUTH  DAILY   aspirin EC 81 MG tablet Take 81 mg by mouth in the morning.   B-complex with vitamin C tablet Take 1 tablet by mouth in the morning.   Biotin 10000 MCG Tabs Take 10,000 mcg by mouth in the morning.   calcium carbonate 1500 (600 Ca) MG Tabs tablet Commonly known as: OSCAL Take 600 mg of elemental calcium by mouth in the morning and at bedtime.   clopidogrel 75 MG tablet Commonly known as: Plavix Take 1 tablet (75 mg total) by mouth daily.   Coenzyme Q10 300 MG Caps Take 300 mg by mouth in the morning.   hydrochlorothiazide 25 MG tablet Commonly  known as: HYDRODIURIL Take 25 mg by mouth in the morning.   lovastatin 20 MG tablet Commonly known as: MEVACOR TAKE 1  TABLET BY MOUTH ONCE  DAILY AT 6:00 PM   Magnesium 250 MG Tabs Take 250 mg by mouth in the morning.   nitroGLYCERIN 0.4 MG SL tablet Commonly known as: Nitrostat Place 1 tablet (0.4 mg total) under the tongue every 5 (five) minutes as needed for chest pain (up to 3 doses. If taking 3rd dose call 911).   Potassium 99 MG Tabs Take 99 mg by mouth in the morning.   QC Vitamin D3 50 MCG (2000 UT) Tabs Generic drug: Cholecalciferol Take 2,000 Units by mouth in the morning and at bedtime.   Vitamin C 500 MG Caps Take 500 mg by mouth in the morning.   Vitamin K2 100 MCG Tabs Take 100 mcg by mouth in the morning and at bedtime.   zinc sulfate 220 (50 Zn) MG capsule Take 220 mg by mouth in the morning.           Allergies Allergies  Allergen Reactions   Penicillins Hives    Has patient had a PCN reaction causing immediate rash, facial/tongue/throat swelling, SOB or lightheadedness with hypotension: unknown Has patient had a PCN reaction causing severe rash involving mucus membranes or skin necrosis: unknown Has patient had a PCN reaction that required hospitalization: no Has patient had a PCN reaction occurring within the last 10 years: no If all of the above answers are "NO", then may proceed with Cephalosporin use.    Prednisone Other (See Comments)    DIZZINESS and swelling    Outstanding Labs/Studies   N/A  Duration of Discharge Encounter   Greater than 30 minutes including physician time.  Signed, Charlie Pitter, PA-C 05/20/2022, 2:47 PM   Agree with findings by Melina Copa PA-C  Ms. Bordeaux was admitted as a same-day cath for chest pain and a positive coronary CTA.  She had high-grade mid LAD stenosis which I successfully stented from the radial approach.  She did have ostial diagonal branch disease which I elected not to intervene on.  The procedure was uncomplicated.  The patient was deemed stable for discharge on DAPT.  I will see her back in the office next  week.  Lorretta Harp, M.D., [place], [facility], [place] [address] [place], [place]  [address]  [phone] 05/20/2022 3:07 PM

## 2022-05-20 NOTE — Discharge Instructions (Signed)
Information about your medication: Plavix (anti-platelet agent)  Generic Name (Brand): clopidogrel (Plavix), once daily medication  PURPOSE: You are taking this medication along with aspirin to lower your chance of having a heart attack, stroke, or blood clots in your heart stent. These can be fatal. Plavix and aspirin help prevent platelets from sticking together and forming a clot that can block an artery or your stent.   Common SIDE EFFECTS you may experience include: bruising or bleeding more easily, shortness of breath  Do not stop taking PLAVIX without talking to the doctor who prescribes it for you. People who are treated with a stent and stop taking Plavix too soon, have a higher risk of getting a blood clot in the stent, having a heart attack, or dying. If you stop Plavix because of bleeding, or for other reasons, your risk of a heart attack or stroke may increase.   Tell all of your doctors and dentists that you are taking Plavix. They should talk to the doctor who prescribed Plavix for you before you have any surgery or invasive procedure.   Contact your health care provider if you experience: severe or uncontrollable bleeding, pink/red/brown urine, vomiting blood or vomit that looks like "coffee grounds", red or black stools (looks like tar), coughing up blood or blood clots ----------------------------------------------------------------------------------------------------------------------

## 2022-05-20 NOTE — Interval H&P Note (Signed)
Cath Lab Visit (complete for each Cath Lab visit)  Clinical Evaluation Leading to the Procedure:   ACS: No.  Non-ACS:    Anginal Classification: CCS II  Anti-ischemic medical therapy: Minimal Therapy (1 class of medications)  Non-Invasive Test Results: No non-invasive testing performed  Prior CABG: No previous CABG      History and Physical Interval Note:  05/20/2022 8:28 AM  Brittany Weaver  has presented today for surgery, with the diagnosis of CAD.  The various methods of treatment have been discussed with the patient and family. After consideration of risks, benefits and other options for treatment, the patient has consented to  Procedure(s): LEFT HEART CATH AND CORONARY ANGIOGRAPHY (N/A) as a surgical intervention.  The patient's history has been reviewed, patient examined, no change in status, stable for surgery.  I have reviewed the patient's chart and labs.  Questions were answered to the patient's satisfaction.     Quay Burow

## 2022-05-20 NOTE — Telephone Encounter (Signed)
   Attention TOC pool,  This patient will need a TOC phone call after discharge. They are being discharged likely later today as a same day PCI. Follow-up appointment has already been arranged with: Dr. Gwenlyn Found 1/31 They are a patient of Quay Burow, MD.  Thank you! Charlie Pitter, PA-C

## 2022-05-20 NOTE — Progress Notes (Signed)
Pt was educated on stent card, stent location, Plavix and ASA use, wt restrictions, no baths/daily wash-ups, s/s of infection, ex guidelines (progressive walking and resistance exercise), s/s to stop exercising, NTG use and calling 911, heart healthy diet, risk factors (smoking, High LDLs), and CRPII. Pt received materials on exercise, diet, and CRPII. Will refer to AP.    Brittany Weaver 05/20/2022 11:54 AM

## 2022-05-21 ENCOUNTER — Encounter (HOSPITAL_COMMUNITY): Payer: Self-pay | Admitting: Cardiovascular Disease

## 2022-05-21 NOTE — Telephone Encounter (Signed)
Patient contacted regarding discharge from [facility] on 05/20/22.  Patient understands to follow up with provider Brittany Weaver on 05/29/22 at 9:30 at [place]. Patient understands discharge instructions? Yes Patient understands medications and regiment? Yes Patient understands to bring all medications to this visit? Yes  Ask patient:  Are you enrolled in My Chart (yes or no)  If no ask patient if they would like to enroll. No  Patient stated rt wrist is without pain/discomfort and denies numbness/tingling/pain in rt wrist.

## 2022-05-28 ENCOUNTER — Ambulatory Visit: Payer: Medicare Other | Admitting: Student

## 2022-05-29 ENCOUNTER — Encounter: Payer: Self-pay | Admitting: Cardiovascular Disease

## 2022-05-29 ENCOUNTER — Ambulatory Visit: Payer: Medicare Other | Attending: Cardiovascular Disease | Admitting: Cardiovascular Disease

## 2022-05-29 VITALS — BP 138/62 | HR 72 | Ht 66.0 in | Wt 160.0 lb

## 2022-05-29 DIAGNOSIS — I25110 Atherosclerotic heart disease of native coronary artery with unstable angina pectoris: Secondary | ICD-10-CM

## 2022-05-29 MED ORDER — CLOPIDOGREL BISULFATE 75 MG PO TABS: 75.0000 mg | ORAL_TABLET | Freq: Every day | ORAL | 3 refills | Status: DC

## 2022-05-29 NOTE — Patient Instructions (Signed)
Medication Instructions:  Your physician recommends that you continue on your current medications as directed. Please refer to the Current Medication list given to you today.  *If you need a refill on your cardiac medications before your next appointment, please call your pharmacy*   Follow-Up: At [facility], you and your health needs are our priority.  As part of our continuing mission to provide you with exceptional heart care, we have created designated Provider Care Teams.  These Care Teams include your primary Cardiologist (physician) and Advanced Practice Providers (APPs -  Physician Assistants and Nurse Practitioners) who all work together to provide you with the care you need, when you need it.  We recommend signing up for the patient portal called "MyChart".  Sign up information is provided on this After Visit Summary.  MyChart is used to connect with patients for Virtual Visits (Telemedicine).  Patients are able to view lab/test results, encounter notes, upcoming appointments, etc.  Non-urgent messages can be sent to your provider as well.   To learn more about what you can do with MyChart, go to [email or web address].    Your next appointment:   3 month(s)  Provider:   Quay Burow, MD

## 2022-05-29 NOTE — Assessment & Plan Note (Signed)
Brittany Weaver returns today after her recent cardiac catheterization which I performed 05/20/2022 because of back and arm pain in the setting of a high coronary calcium score of 1737 with disease in the LAD.  Catheterization performed radially revealed an 80% mid LAD lesion which I stented with a 2.25 x 18 mm long Synergy drug-eluting stent.  She did have a 90% ostial second diagonal branch stenosis which I did not intervene on.  She was discharged home the same day.  She does have some ecchymosis in her right wrist but no bruit.  I suspect this will resolve over the next several weeks.  She is on DAPT with aspirin and clopidogrel.  She still gets occasional back and arm pain.

## 2022-05-29 NOTE — Progress Notes (Signed)
05/29/2022 Brittany Weaver   03-05-1941  [id number]  Primary Physician Sharilyn Sites, MD Primary Cardiologist: Lorretta Harp MD [name], [name], [name]  HPI:  Brittany Weaver is a 82 y.o.    thin-appearing, married Caucasian female, mother of 3 who I last saw in the 05/15/2022.Marland Kitchen  She is accompanied by her husband Brittany Weaver and her son Brittany Weaver who is a Barista here in [place].  She has a strong family history of heart disease (father died of an MI at age 33, sister had CABG and brother has had stents)., as well as hypertension, hyperlipidemia and paroxysmal A-fib. She is a retired Loss adjuster, chartered. Her last Myoview performed 3 years ago was nonischemic. She denies chest pain or shortness of breath. She does have occasional episodes of breakthrough A-fib with some dizziness but no presyncope. We will discuss oral anticoagulation which she preferred not to be on which I can't disagree with given her relative infrequency of breakthrough episodes.   She was recently admitted to [facility] with chest pain and hypertension on 05/24/16 at discharge the next day. She ruled out for myocardial infarction. She was bradycardic and her metoprolol was discontinued. 2-D echo was normal. She is under a lot of stress at home raising 2 grandchildren. Her chest pain has since resolved since I saw her in the office 2 weeks ago. A pharmacologic Myoview stress test performed 06/06/16 was nonischemic.    She had a coronary CTA performed 05/09/2022 revealing a coronary calcium score 1737 with what appears to be significant disease in her mid LAD.  Based on this, her family history risk factors we decided to proceed with outpatient diagnostic coronary angiography.  Outpatient radial diagnostic cath was performed on 05/20/2022 revealing 80% mid LAD stenosis just after a second diagonal branch which had an 80% ostial stenosis.  Expected her LAD with a 2.25 mm x 18 mm long Synergy drug-eluting stent with excellent  result.  She was discharged home the same day.  She does have some ecchymosis in her right wrist.  She is on DAPT with aspirin and clopidogrel.   Current Meds  Medication Sig   acetaminophen (TYLENOL) 500 MG tablet Take 500-1,000 mg by mouth every 6 (six) hours as needed (pain.).   ALPRAZolam (XANAX) 0.25 MG tablet Take 0.25 mg by mouth daily as needed (when a car passenger).   amLODipine (NORVASC) 2.5 MG tablet TAKE 1 TABLET BY MOUTH  DAILY   Ascorbic Acid (VITAMIN C) 500 MG CAPS Take 500 mg by mouth in the morning.   aspirin EC 81 MG tablet Take 81 mg by mouth in the morning.   B Complex-C (B-COMPLEX WITH VITAMIN C) tablet Take 1 tablet by mouth in the morning.   Biotin 10000 MCG TABS Take 10,000 mcg by mouth in the morning.   calcium carbonate (OSCAL) 1500 (600 Ca) MG TABS tablet Take 600 mg of elemental calcium by mouth in the morning and at bedtime.   Cholecalciferol (QC VITAMIN D3) 50 MCG (2000 UT) TABS Take 2,000 Units by mouth in the morning and at bedtime.   Coenzyme Q10 300 MG CAPS Take 300 mg by mouth in the morning.   hydrochlorothiazide (HYDRODIURIL) 25 MG tablet Take 25 mg by mouth in the morning.   lovastatin (MEVACOR) 20 MG tablet TAKE 1 TABLET BY MOUTH ONCE  DAILY AT 6:00 PM   Magnesium 250 MG TABS Take 250 mg by mouth in the morning.   Menatetrenone (VITAMIN  K2) 100 MCG TABS Take 100 mcg by mouth in the morning and at bedtime.   nitroGLYCERIN (NITROSTAT) 0.4 MG SL tablet Place 1 tablet (0.4 mg total) under the tongue every 5 (five) minutes as needed for chest pain (up to 3 doses. If taking 3rd dose call 911).   Potassium 99 MG TABS Take 99 mg by mouth in the morning.   zinc sulfate 220 (50 Zn) MG capsule Take 220 mg by mouth in the morning.   [DISCONTINUED] clopidogrel (PLAVIX) 75 MG tablet Take 1 tablet (75 mg total) by mouth daily.     Allergies  Allergen Reactions   Penicillins Hives    Has patient had a PCN reaction causing immediate rash, facial/tongue/throat  swelling, SOB or lightheadedness with hypotension: unknown Has patient had a PCN reaction causing severe rash involving mucus membranes or skin necrosis: unknown Has patient had a PCN reaction that required hospitalization: no Has patient had a PCN reaction occurring within the last 10 years: no If all of the above answers are "NO", then may proceed with Cephalosporin use.    Prednisone Other (See Comments)    DIZZINESS and swelling    Social History   Socioeconomic History   Marital status: Married    Spouse name: Not on file   Number of children: Not on file   Years of education: Not on file   Highest education level: Not on file  Occupational History   Not on file  Tobacco Use   Smoking status: Never   Smokeless tobacco: Never  Vaping Use   Vaping Use: Never used  Substance and Sexual Activity   Alcohol use: Yes    Comment: 2 drinks/month   Drug use: No   Sexual activity: Yes    Birth control/protection: Post-menopausal    Comment: 1st intercourse 29 yo-1 partner  Other Topics Concern   Not on file  Social History Narrative   Not on file   Social Determinants of Health   Financial Resource Strain: Not on file  Food Insecurity: Not on file  Transportation Needs: Not on file  Physical Activity: Not on file  Stress: Not on file  Social Connections: Not on file  Intimate Partner Violence: Not on file     Review of Systems: General: negative for chills, fever, night sweats or weight changes.  Cardiovascular: negative for chest pain, dyspnea on exertion, edema, orthopnea, palpitations, paroxysmal nocturnal dyspnea or shortness of breath Dermatological: negative for rash Respiratory: negative for cough or wheezing Urologic: negative for hematuria Abdominal: negative for nausea, vomiting, diarrhea, bright red blood per rectum, melena, or hematemesis Neurologic: negative for visual changes, syncope, or dizziness All other systems reviewed and are otherwise negative  except as noted above.    Blood pressure 138/62, pulse 72, height '5\' 6"'$  (1.676 m), weight 160 lb (72.6 kg), SpO2 96 %.  General appearance: alert and no distress Neck: no adenopathy, no carotid bruit, no JVD, supple, symmetrical, trachea midline, and thyroid not enlarged, symmetric, no tenderness/mass/nodules Lungs: clear to auscultation bilaterally Heart: regular rate and rhythm, S1, S2 normal, no murmur, click, rub or gallop Extremities: extremities normal, atraumatic, no cyanosis or edema Pulses: 2+ and symmetric Skin: Skin color, texture, turgor normal. No rashes or lesions Neurologic: Grossly normal  EKG not performed today  ASSESSMENT AND PLAN:   Coronary artery disease involving native coronary artery of native heart with unstable angina pectoris ([place]) Ms. Knodel returns today after her recent cardiac catheterization which I performed 05/20/2022 because of  back and arm pain in the setting of a high coronary calcium score of 1737 with disease in the LAD.  Catheterization performed radially revealed an 80% mid LAD lesion which I stented with a 2.25 x 18 mm long Synergy drug-eluting stent.  She did have a 90% ostial second diagonal branch stenosis which I did not intervene on.  She was discharged home the same day.  She does have some ecchymosis in her right wrist but no bruit.  I suspect this will resolve over the next several weeks.  She is on DAPT with aspirin and clopidogrel.  She still gets occasional back and arm pain.     Lorretta Harp MD FACP,FACC,FAHA, [facility] 05/29/2022 10:40 AM

## 2022-06-03 ENCOUNTER — Ambulatory Visit (INDEPENDENT_AMBULATORY_CARE_PROVIDER_SITE_OTHER): Payer: Medicare Other

## 2022-06-03 DIAGNOSIS — I2510 Atherosclerotic heart disease of native coronary artery without angina pectoris: Secondary | ICD-10-CM | POA: Diagnosis not present

## 2022-06-03 LAB — ECHOCARDIOGRAM COMPLETE
Area-P 1/2: 3.99 cm2
MV M vel: 2.17 m/s
MV Peak grad: 18.8 mmHg
S' Lateral: 2.49 cm

## 2022-06-25 ENCOUNTER — Telehealth: Payer: Self-pay | Admitting: Cardiovascular Disease

## 2022-06-25 NOTE — Telephone Encounter (Signed)
New Message:       Patient said she had a stent put in on 06-07-22. She said this past Saturday(06-22-22), she had an episode where she got real weak and cold. Sghe said it lsted for about 10 minutes.She wonder if this episode might be related to the stent

## 2022-06-25 NOTE — Telephone Encounter (Signed)
Spoke with pt, aware have not heard of stents causing those types of symptoms. She was not able to take her blood pressure but did put her apple watch on and it did not show anything abnormal. Aware if happens again, getting her blood pressure would be helpful. She wants to make dr berry aware, message forwarded to dr berry

## 2022-06-28 NOTE — Telephone Encounter (Signed)
Lorretta Harp, MD  Cristopher Estimable, RN2 days ago    Brittany Weaver Found symptoms probably were not related to her stent.

## 2022-08-02 ENCOUNTER — Other Ambulatory Visit: Payer: Self-pay | Admitting: Cardiovascular Disease

## 2022-08-28 ENCOUNTER — Ambulatory Visit: Payer: Medicare Other | Attending: Cardiovascular Disease | Admitting: Cardiovascular Disease

## 2022-08-28 ENCOUNTER — Encounter: Payer: Self-pay | Admitting: Cardiovascular Disease

## 2022-08-28 VITALS — BP 118/50 | HR 68 | Ht 66.0 in | Wt 159.0 lb

## 2022-08-28 DIAGNOSIS — I25110 Atherosclerotic heart disease of native coronary artery with unstable angina pectoris: Secondary | ICD-10-CM | POA: Diagnosis not present

## 2022-08-28 DIAGNOSIS — I10 Essential (primary) hypertension: Secondary | ICD-10-CM | POA: Diagnosis not present

## 2022-08-28 DIAGNOSIS — E782 Mixed hyperlipidemia: Secondary | ICD-10-CM | POA: Diagnosis not present

## 2022-08-28 MED ORDER — LOVASTATIN 40 MG PO TABS: 40.0000 mg | ORAL_TABLET | Freq: Every day | ORAL | 3 refills | Status: DC

## 2022-08-28 NOTE — Patient Instructions (Signed)
Medication Instructions:  Your physician has recommended you make the following change in your medication:  INCREASE: Lovastatin 40 mg daily *If you need a refill on your cardiac medications before your next appointment, please call your pharmacy*   Lab Work: Your physician recommends that you return for lab work in 3 months: Lipids, LFT If you have labs (blood work) drawn today and your tests are completely normal, you will receive your results only by: MyChart Message (if you have MyChart) OR A paper copy in the mail If you have any lab test that is abnormal or we need to change your treatment, we will call you to review the results.   Testing/Procedures: None   Follow-Up: At [facility], you and your health needs are our priority.  As part of our continuing mission to provide you with exceptional heart care, we have created designated Provider Care Teams.  These Care Teams include your primary Cardiologist (physician) and Advanced Practice Providers (APPs -  Physician Assistants and Nurse Practitioners) who all work together to provide you with the care you need, when you need it.   Your next appointment:   6 month(s)  Provider:   Nanetta Batty, MD

## 2022-08-28 NOTE — Assessment & Plan Note (Signed)
History of CAD status post coronary CTA performed 05/09/2022 because of episodes of left arm and back pain this revealed a coronary calcium score of 1737 with significant disease in her mid LAD.  Based on this I brought her in for outpatient radial diagnostic cath which I performed 05/20/2022 revealing a high-grade mid LAD stenosis as well as an ostial second diagonal branch stenosis.  I performed PCI drug-eluting stenting with a 2.25 x 18 mm long Medtronic Onyx drug-eluting stent postdilated to 2.61 mm.  Since her stent procedure she no longer has symptoms.  She is now actively working in her garden.

## 2022-08-28 NOTE — Progress Notes (Signed)
08/28/2022 AVIGAIL PILLING   [birth year]  [id number]  Primary Physician Assunta Found, MD Primary Cardiologist: Runell Gess MD Milagros Loll, [place], [place]  HPI:  ALEICIA KENAGY is a 82 y.o.  thin-appearing, married Caucasian female, mother of 3 who I last saw in the 05/15/2022.Marland Kitchen  She is accompanied by her husband Onalee Hua and her son Nedra Hai who is a Oncologist here in [place].  She has a strong family history of heart disease (father died of an MI at age 39, sister had CABG and brother has had stents)., as well as hypertension, hyperlipidemia and paroxysmal A-fib. She is a retired Teacher, music. Her last Myoview performed 3 years ago was nonischemic. She denies chest pain or shortness of breath. She does have occasional episodes of breakthrough A-fib with some dizziness but no presyncope. We will discuss oral anticoagulation which she preferred not to be on which I can't disagree with given her relative infrequency of breakthrough episodes.   She was recently admitted to [facility] with chest pain and hypertension on 05/24/16 at discharge the next day. She ruled out for myocardial infarction. She was bradycardic and her metoprolol was discontinued. 2-D echo was normal. She is under a lot of stress at home raising 2 grandchildren. Her chest pain has since resolved since I saw her in the office 2 weeks ago. A pharmacologic Myoview stress test performed 06/06/16 was nonischemic.   Since I saw her a year ago she continues to do well.  She has had a occasional episodes of dizziness without syncope.  She does also have episodes of left arm pain and back pain but really denies chest pain.  She had a coronary CTA performed 05/09/2022 revealing a coronary calcium score 1737 with what appears to be significant disease in her mid LAD.  Based on this, her family history risk factors we decided to proceed with outpatient diagnostic coronary angiography.  Appreciate radial diagnostic cath on her  05/20/2022 revealing a high-grade mid LAD stenosis which I stented using a 2.25 x 18 mm long Medtronic Onyx frontier drug-eluting stent postdilated to 2.61 mm.  She was discharged the same day.  Her symptoms have completely resolved.  She is now actively working in her garden without limitation.   Current Meds  Medication Sig   acetaminophen (TYLENOL) 500 MG tablet Take 500-1,000 mg by mouth every 6 (six) hours as needed (pain.).   ALPRAZolam (XANAX) 0.25 MG tablet Take 0.25 mg by mouth daily as needed (when a car passenger).   amLODipine (NORVASC) 2.5 MG tablet TAKE 1 TABLET BY MOUTH DAILY   Ascorbic Acid (VITAMIN C) 500 MG CAPS Take 500 mg by mouth in the morning.   aspirin EC 81 MG tablet Take 81 mg by mouth in the morning.   B Complex-C (B-COMPLEX WITH VITAMIN C) tablet Take 1 tablet by mouth in the morning.   Biotin 40981 MCG TABS Take 10,000 mcg by mouth in the morning.   calcium carbonate (OSCAL) 1500 (600 Ca) MG TABS tablet Take 600 mg of elemental calcium by mouth in the morning and at bedtime.   Cholecalciferol (QC VITAMIN D3) 50 MCG (2000 UT) TABS Take 2,000 Units by mouth in the morning and at bedtime.   clopidogrel (PLAVIX) 75 MG tablet Take 1 tablet (75 mg total) by mouth daily.   Coenzyme Q10 300 MG CAPS Take 300 mg by mouth in the morning.   hydrochlorothiazide (HYDRODIURIL) 25 MG tablet Take 25 mg by  mouth in the morning.   lovastatin (MEVACOR) 40 MG tablet Take 1 tablet (40 mg total) by mouth at bedtime.   Magnesium 250 MG TABS Take 250 mg by mouth in the morning.   Menatetrenone (VITAMIN K2) 100 MCG TABS Take 100 mcg by mouth in the morning and at bedtime.   nitroGLYCERIN (NITROSTAT) 0.4 MG SL tablet Place 1 tablet (0.4 mg total) under the tongue every 5 (five) minutes as needed for chest pain (up to 3 doses. If taking 3rd dose call 911).   Potassium 99 MG TABS Take 99 mg by mouth in the morning.   zinc sulfate 220 (50 Zn) MG capsule Take 220 mg by mouth in the morning.    [DISCONTINUED] lovastatin (MEVACOR) 20 MG tablet TAKE 1 TABLET BY MOUTH ONCE  DAILY AT 6:00 PM     Allergies  Allergen Reactions   Penicillins Hives    Has patient had a PCN reaction causing immediate rash, facial/tongue/throat swelling, SOB or lightheadedness with hypotension: unknown Has patient had a PCN reaction causing severe rash involving mucus membranes or skin necrosis: unknown Has patient had a PCN reaction that required hospitalization: no Has patient had a PCN reaction occurring within the last 10 years: no If all of the above answers are "NO", then may proceed with Cephalosporin use.    Prednisone Other (See Comments)    DIZZINESS and swelling    Social History   Socioeconomic History   Marital status: Married    Spouse name: Not on file   Number of children: Not on file   Years of education: Not on file   Highest education level: Not on file  Occupational History   Not on file  Tobacco Use   Smoking status: Never   Smokeless tobacco: Never  Vaping Use   Vaping Use: Never used  Substance and Sexual Activity   Alcohol use: Yes    Comment: 2 drinks/month   Drug use: No   Sexual activity: Yes    Birth control/protection: Post-menopausal    Comment: 1st intercourse 2 yo-1 partner  Other Topics Concern   Not on file  Social History Narrative   Not on file   Social Determinants of Health   Financial Resource Strain: Not on file  Food Insecurity: Not on file  Transportation Needs: Not on file  Physical Activity: Not on file  Stress: Not on file  Social Connections: Not on file  Intimate Partner Violence: Not on file     Review of Systems: General: negative for chills, fever, night sweats or weight changes.  Cardiovascular: negative for chest pain, dyspnea on exertion, edema, orthopnea, palpitations, paroxysmal nocturnal dyspnea or shortness of breath Dermatological: negative for rash Respiratory: negative for cough or wheezing Urologic: negative for  hematuria Abdominal: negative for nausea, vomiting, diarrhea, bright red blood per rectum, melena, or hematemesis Neurologic: negative for visual changes, syncope, or dizziness All other systems reviewed and are otherwise negative except as noted above.    Blood pressure (!) 118/50, pulse 68, height 5\' 6"  (1.676 m), weight 159 lb (72.1 kg).  General appearance: alert and no distress Neck: no adenopathy, no carotid bruit, no JVD, supple, symmetrical, trachea midline, and thyroid not enlarged, symmetric, no tenderness/mass/nodules Lungs: clear to auscultation bilaterally Heart: regular rate and rhythm, S1, S2 normal, no murmur, click, rub or gallop Extremities: extremities normal, atraumatic, no cyanosis or edema Pulses: 2+ and symmetric Skin: Skin color, texture, turgor normal. No rashes or lesions Neurologic: Grossly normal  EKG  not performed today  ASSESSMENT AND PLAN:   Essential hypertension History of essential hypertension blood pressure measured today at 118/50.  She is on amlodipine, and HydroDIURIL.  Hyperlipidemia History of hyperlipidemia on lovastatin 20 mg a day.  Apparently she has tried other statin drugs which she has been intolerant to most recent lipid profile performed 03/28/2022 revealed total cholesterol 162, LDL 79 and HDL 61, not quite at goal for secondary prevention.  I am going to increase her lovastatin from 20 to 40 mg a day and we will recheck a lipid liver profile in 3 months.  Coronary artery disease involving native coronary artery of native heart with unstable angina pectoris (HCC) History of CAD status post coronary CTA performed 05/09/2022 because of episodes of left arm and back pain this revealed a coronary calcium score of 1737 with significant disease in her mid LAD.  Based on this I brought her in for outpatient radial diagnostic cath which I performed 05/20/2022 revealing a high-grade mid LAD stenosis as well as an ostial second diagonal branch  stenosis.  I performed PCI drug-eluting stenting with a 2.25 x 18 mm long Medtronic Onyx drug-eluting stent postdilated to 2.61 mm.  Since her stent procedure she no longer has symptoms.  She is now actively working in her garden.     Runell Gess MD FACP,FACC,FAHA, [facility] 08/28/2022 11:56 AM

## 2022-08-28 NOTE — Assessment & Plan Note (Signed)
History of essential hypertension blood pressure measured today at 118/50.  She is on amlodipine, and HydroDIURIL.

## 2022-08-28 NOTE — Assessment & Plan Note (Signed)
History of hyperlipidemia on lovastatin 20 mg a day.  Apparently she has tried other statin drugs which she has been intolerant to most recent lipid profile performed 03/28/2022 revealed total cholesterol 162, LDL 79 and HDL 61, not quite at goal for secondary prevention.  I am going to increase her lovastatin from 20 to 40 mg a day and we will recheck a lipid liver profile in 3 months.

## 2022-09-03 DIAGNOSIS — H35342 Macular cyst, hole, or pseudohole, left eye: Secondary | ICD-10-CM | POA: Diagnosis not present

## 2022-09-03 DIAGNOSIS — D3132 Benign neoplasm of left choroid: Secondary | ICD-10-CM | POA: Diagnosis not present

## 2022-09-03 DIAGNOSIS — H524 Presbyopia: Secondary | ICD-10-CM | POA: Diagnosis not present

## 2022-09-03 DIAGNOSIS — H40013 Open angle with borderline findings, low risk, bilateral: Secondary | ICD-10-CM | POA: Diagnosis not present

## 2022-09-03 DIAGNOSIS — H04123 Dry eye syndrome of bilateral lacrimal glands: Secondary | ICD-10-CM | POA: Diagnosis not present

## 2022-09-24 ENCOUNTER — Telehealth: Payer: Self-pay | Admitting: Cardiovascular Disease

## 2022-09-24 NOTE — Telephone Encounter (Signed)
Pt is returning call.  

## 2022-09-24 NOTE — Telephone Encounter (Signed)
Called patient, LVM to call back to discuss.  Left call back number.   

## 2022-09-24 NOTE — Telephone Encounter (Signed)
Returned call to patient left message on personal voice mail to call back. 

## 2022-09-24 NOTE — Telephone Encounter (Signed)
Pt c/o of Chest Pain: STAT if CP now or developed within 24 hours  1. Are you having CP right now?  No   2. Are you experiencing any other symptoms (ex. SOB, nausea, vomiting, sweating)?  Back pain across shoulder blades, chest tightness   3. How long have you been experiencing CP?  About 1 week   4. Is your CP continuous or coming and going?  Coming and going, mainly occurs with exertion  5. Have you taken Nitroglycerin?  Took 2 nitro last night, CP eased up after taking the second one

## 2022-09-24 NOTE — Telephone Encounter (Signed)
Patient returned call advised of message below.  Patient advised to call back if things do not improve.  Patient verbalized understanding

## 2022-09-24 NOTE — Telephone Encounter (Signed)
Called patient back, she states that she has been having some chest pain for over a week now- she states she can be working in her garden and does not have it, if she bends over this is when she notices the pain. Patient does not have any other symptoms to report, states that her back hurts- across her shoulder blades. Per last note with Dr.Berry on 08/28/2022- she did continue to have back pain and discomfort, but no chest pain. Patient states she took 2 nitro's last night and it eased off. She states that her blood pressure and HR has been good. (No BP readings to provide, but HR was 63-70 bpm), o2 status has been 98-99%. Patient states she has not had any of these issues, but noticed them for the last week and wanted to discuss with Dr.Berry. advised I would route to MD.   Patient is continuing to take medications as prescribed.

## 2022-09-25 ENCOUNTER — Other Ambulatory Visit: Payer: Self-pay

## 2022-09-25 ENCOUNTER — Ambulatory Visit
Admission: EM | Admit: 2022-09-25 | Discharge: 2022-09-25 | Disposition: A | Discharge status: Home / Self Care | Payer: Medicare Other | Attending: Nurse Practitioner | Admitting: Nurse Practitioner

## 2022-09-25 DIAGNOSIS — I10 Essential (primary) hypertension: Secondary | ICD-10-CM

## 2022-09-25 DIAGNOSIS — R079 Chest pain, unspecified: Secondary | ICD-10-CM | POA: Diagnosis not present

## 2022-09-25 NOTE — Discharge Instructions (Addendum)
The EKG today looks great.  If you want to control your blood pressure more aggressively, increase amlodipine to 5 mg daily.  Keep a check of your blood pressure and decrease dose to 2.5 mg if you develop dizziness or BP less than 100/60.  If the chest pain returns, please go to the ER or call 911.

## 2022-09-25 NOTE — ED Provider Notes (Signed)
[facility]    CSN: [id number] Arrival date & time: 09/25/22  1255      History   Chief Complaint No chief complaint on file.   HPI Brittany Weaver is a 82 y.o. female.   Patient presents today with blood pressure concerns.  Reports for the past couple of days, she has been having chest tightness that is worse with activity.  She has been working in the yard and gardening and pain is worse during these activities.  Also has mild chest tightness when she goes up the stairs in her home.  Reports the pain is a tightness in the mid chest that goes around to her back in between the shoulder blades.  Pain only occurs with exertion.  No recent increase in anxiety or stressors, shortness of breath, cough/congestion,  nausea/vomiting, diaphoresis, heartburn/indigestion, or palpitations associated with the chest tightness.  Has taken nitroglycerin which does seem to help with the pain.   Patient is concerned because she has a history of coronary artery disease and had a stent placed earlier this year in her LAD.  She has a significant family history of heart attacks and strokes as well.  Reports she has a follow up scheduled with her PCP in approximately 1 week.    Past Medical History:  Diagnosis Date   Chest pain    a. 05/2016 Myoview: EF 74%, no ischemia/infarct.   Elevated serum creatinine 05/29/2021   decreased GFR   History of COVID-19 11/28/2019   History of echocardiogram    a. 04/2016 Echo: EF 60-65%, no rwma.   Hypercholesterolemia    Hypertension    Macular hole of left eye    Mitral valve prolapse    no issues   Osteopenia 2013   T score -1.4 stable from prior DEXA 2007 no FRAX calculated   Paroxysmal atrial fibrillation (HCC)    a. CHA2DS2VASc = 4-->Xarelto.   Pelvic relaxation    Mild and asymptomatic.   PONV (postoperative nausea and vomiting)    "little bit of nausea" after cataract surgery    Patient Active Problem List   Diagnosis Date Noted   Coronary  artery disease involving native coronary artery of native heart with unstable angina pectoris (HCC) 05/20/2022   Chronic kidney disease, stage 3a (HCC) 05/20/2022   Hypokalemia 05/20/2022   Dizziness 01/29/2019   Chest pain 05/24/2016   Sinus bradycardia 05/24/2016   Macular hole, left eye 11/22/2014   Rectocele 09/27/2014   Essential hypertension 06/01/2013   Hyperlipidemia 06/01/2013   Paroxysmal atrial fibrillation (HCC) 06/01/2013    Past Surgical History:  Procedure Laterality Date   25 GAUGE PARS PLANA VITRECTOMY WITH 20 GAUGE MVR PORT FOR MACULAR HOLE Left 11/22/2014   Procedure: 25 GAUGE PARS PLANA VITRECTOMY WITH 20 GAUGE MVR PORT FOR MACULAR HOLE LEFT EYE ;  Surgeon: Sherrie George, MD;  Location: [facility] OR;  Service: Ophthalmology;  Laterality: Left;   BREAST BIOPSY Right 01/2021   CATARACT EXTRACTION, BILATERAL     Dr Elmer Picker   COLONOSCOPY     CORONARY STENT INTERVENTION N/A 05/20/2022   Procedure: CORONARY STENT INTERVENTION;  Surgeon: Runell Gess, MD;  Location: [facility];  Service: Cardiovascular;  Laterality: N/A;   DILATION AND CURETTAGE OF UTERUS     PT. DOES NOT REMEMBER THE YEAR   DOPPLER ECHOCARDIOGRAPHY  05/13/2007   EYE SURGERY Bilateral    cataract surgery with lens implant   GAS/FLUID EXCHANGE Left 11/22/2014   Procedure:  GAS/FLUID EXCHANGE;  Surgeon: Sherrie George, MD;  Location: [facility] OR;  Service: Ophthalmology;  Laterality: Left;  [id number]   LASER PHOTO ABLATION Left 11/22/2014   Procedure: LASER PHOTO ABLATION;  Surgeon: Sherrie George, MD;  Location: [facility] OR;  Service: Ophthalmology;  Laterality: Left;  Headscope laser   LEFT HEART CATH AND CORONARY ANGIOGRAPHY N/A 05/20/2022   Procedure: LEFT HEART CATH AND CORONARY ANGIOGRAPHY;  Surgeon: Runell Gess, MD;  Location: [facility];  Service: Cardiovascular;  Laterality: N/A;   MEMBRANE PEEL Left 11/22/2014   Procedure: MEMBRANE PEEL;  Surgeon: Sherrie George, MD;  Location: [facility] OR;   Service: Ophthalmology;  Laterality: Left;   myoview   06/05/2009   SERUM PATCH Left 11/22/2014   Procedure: SERUM PATCH;  Surgeon: Sherrie George, MD;  Location[facility] OR;  Service: Ophthalmology;  Laterality: Left;    OB History     Gravida  3   Para  3   Term  3   Preterm      AB      Living  3      SAB      IAB      Ectopic      Multiple      Live Births               Home Medications    Prior to Admission medications   Medication Sig Start Date End Date Taking? Authorizing Provider  acetaminophen (TYLENOL) 500 MG tablet Take 500-1,000 mg by mouth every 6 (six) hours as needed (pain.).    [provider]  ALPRAZolam Prudy Feeler) 0.25 MG tablet Take 0.25 mg by mouth daily as needed (when a car passenger). 09/29/19   [provider]  amLODipine (NORVASC) 2.5 MG tablet TAKE 1 TABLET BY MOUTH DAILY 08/05/22   Runell Gess, MD  Ascorbic Acid (VITAMIN C) 500 MG CAPS Take 500 mg by mouth in the morning.    [provider]  aspirin EC 81 MG tablet Take 81 mg by mouth in the morning.    [provider]  B Complex-C (B-COMPLEX WITH VITAMIN C) tablet Take 1 tablet by mouth in the morning.    [provider]  Biotin 62952 MCG TABS Take 10,000 mcg by mouth in the morning.    [provider]  calcium carbonate (OSCAL) 1500 (600 Ca) MG TABS tablet Take 600 mg of elemental calcium by mouth in the morning and at bedtime.    [provider]  Cholecalciferol (QC VITAMIN D3) 50 MCG (2000 UT) TABS Take 2,000 Units by mouth in the morning and at bedtime.    [provider]  clopidogrel (PLAVIX) 75 MG tablet Take 1 tablet (75 mg total) by mouth daily. 05/29/22   Runell Gess, MD  Coenzyme Q10 300 MG CAPS Take 300 mg by mouth in the morning.    [provider]  hydrochlorothiazide (HYDRODIURIL) 25 MG tablet Take 25 mg by mouth in the morning.    [provider]  lovastatin (MEVACOR) 40 MG tablet  Take 1 tablet (40 mg total) by mouth at bedtime. 08/28/22   Runell Gess, MD  Magnesium 250 MG TABS Take 250 mg by mouth in the morning.    [provider]  Menatetrenone (VITAMIN K2) 100 MCG TABS Take 100 mcg by mouth in the morning and at bedtime.    [provider]  nitroGLYCERIN (NITROSTAT) 0.4 MG SL tablet Place 1 tablet (  0.4 mg total) under the tongue every 5 (five) minutes as needed for chest pain (up to 3 doses. If taking 3rd dose call 911). 05/20/22   Laurann [place], PA-C  Potassium 99 MG TABS Take 99 mg by mouth in the morning.    [provider]  zinc sulfate 220 (50 Zn) MG capsule Take 220 mg by mouth in the morning.    [provider]    Family History Family History  Problem Relation Age of Onset   Stroke Mother    Heart disease Mother    Kidney disease Mother    Hypertension Mother    Hyperlipidemia Mother    Stroke Brother    Heart disease Brother    Stroke Brother    Heart disease Brother    Heart failure Sister    Cancer Sister        Esophagus/stomach   Stroke Sister    Heart disease Sister    Heart disease Father    Colon cancer Neg Hx    Rectal cancer Neg Hx     Social History Social History   Tobacco Use   Smoking status: Never   Smokeless tobacco: Never  Vaping Use   Vaping Use: Never used  Substance Use Topics   Alcohol use: Yes    Comment: 2 drinks/month   Drug use: No     Allergies   Penicillins and Prednisone   Review of Systems Review of Systems Per HPI  Physical Exam Triage Vital Signs ED Triage Vitals  Enc Vitals Group     BP 09/25/22 1313 (!) 150/79     Pulse Rate 09/25/22 1313 70     Resp 09/25/22 1313 14     Temp 09/25/22 1313 (!) 97.5 F (36.4 C)     Temp Source 09/25/22 1313 Oral     SpO2 09/25/22 1313 96 %     Weight --      Height --      Head Circumference --      Peak Flow --      Pain Score 09/25/22 1314 0     Pain Loc --      Pain Edu? --      Excl. in GC? --    No  data found.  Updated Vital Signs BP (!) 150/79 (BP Location: Right Arm)   Pulse 70   Temp (!) 97.5 F (36.4 C) (Oral)   Resp 14   SpO2 96%   Visual Acuity Right Eye Distance:   Left Eye Distance:   Bilateral Distance:    Right Eye Near:   Left Eye Near:    Bilateral Near:     Physical Exam Vitals and nursing note reviewed.  Constitutional:      General: She is not in acute distress.    Appearance: Normal appearance. She is not toxic-appearing.  HENT:     Head: Normocephalic and atraumatic.     Right Ear: External ear normal.     Left Ear: External ear normal.     Nose: Nose normal. No congestion or rhinorrhea.     Mouth/Throat:     Mouth: Mucous membranes are moist.     Pharynx: Oropharynx is clear. No oropharyngeal exudate or posterior oropharyngeal erythema.  Eyes:     General: No scleral icterus.    Extraocular Movements: Extraocular movements intact.     Pupils: Pupils are equal, round, and reactive to light.  Cardiovascular:     Rate and Rhythm:  Normal rate and regular rhythm.     Heart sounds: Normal heart sounds. No murmur heard. Pulmonary:     Effort: Pulmonary effort is normal. No respiratory distress.     Breath sounds: Normal breath sounds. No stridor. No wheezing, rhonchi or rales.  Musculoskeletal:     Cervical back: Normal range of motion.  Lymphadenopathy:     Cervical: No cervical adenopathy.  Skin:    General: Skin is warm and dry.     Capillary Refill: Capillary refill takes less than 2 seconds.     Coloration: Skin is not jaundiced or pale.     Findings: No erythema.  Neurological:     Mental Status: She is alert and oriented to person, place, and time.  Psychiatric:        Behavior: Behavior is cooperative.      UC Treatments / Results  Labs (all labs ordered are listed, but only abnormal results are displayed) Labs Reviewed - No data to display  EKG   Radiology No results found.  Procedures Procedures (including critical care  time)  Medications Ordered in UC Medications - No data to display  Initial Impression / Assessment and Plan / UC Course  I have reviewed the triage vital signs and the nursing notes.  Pertinent labs & imaging results that were available during my care of the patient were reviewed by me and considered in my medical decision making (see chart for details).   Patient is well-appearing, normotensive, afebrile, not tachycardic, not tachypneic, oxygenating well on room air.    1. Chest pain, unspecified type Patient is currently asymptomatic and vital signs are stable EKG today is reassuring Chest pain is consistent with angina Recommended ER if symptoms recur  2. Essential hypertension Patient desires increasing amlodipine to 5 mg daily to help lower blood pressure until she is able to follow-up with PCP I think this is reasonable She is to check her blood pressure at home and resume normal dosing with any hypotension or symptoms of hypotension Strict ER precautions discussed with patient  The patient was given the opportunity to ask questions.  All questions answered to their satisfaction.  The patient is in agreement to this plan.    Final Clinical Impressions(s) / UC Diagnoses   Final diagnoses:  Chest pain, unspecified type  Essential hypertension     Discharge Instructions      The EKG today looks great.  If you want to control your blood pressure more aggressively, increase amlodipine to 5 mg daily.  Keep a check of your blood pressure and decrease dose to 2.5 mg if you develop dizziness or BP less than 100/60.  If the chest pain returns, please go to the ER or call 911.     ED Prescriptions   None    PDMP not reviewed this encounter.   Valentino Nose, NP 09/25/22 1452

## 2022-09-25 NOTE — ED Triage Notes (Addendum)
Pt c/o BP problem, pt states this morning running between 115-187 on top pt says she contacted her heart doctor because she had a stint place back in January, it started with chest pain after exertion cardiologist said no it's related

## 2022-10-03 DIAGNOSIS — I25119 Atherosclerotic heart disease of native coronary artery with unspecified angina pectoris: Secondary | ICD-10-CM | POA: Diagnosis not present

## 2022-10-03 DIAGNOSIS — I2510 Atherosclerotic heart disease of native coronary artery without angina pectoris: Secondary | ICD-10-CM | POA: Diagnosis not present

## 2022-10-21 ENCOUNTER — Telehealth: Payer: Self-pay | Admitting: Cardiovascular Disease

## 2022-10-21 NOTE — Telephone Encounter (Signed)
Pt c/o of Chest Pain: STAT if CP now or developed within 24 hours  1. Are you having CP right now? No   2. Are you experiencing any other symptoms (ex. SOB, nausea, vomiting, sweating)? No   3. How long have you been experiencing CP? Last two or three weeks   4. Is your CP continuous or coming and going? Coming and going   5. Have you taken Nitroglycerin? Yes   Patient states that this has gotten worse and she would like to get further information on how to take nitroglycerin. Please advise.  ?

## 2022-10-21 NOTE — Telephone Encounter (Signed)
Spoke to the pt for the past 2-3 weeks pt c/o of chest pain, she did have CP last night,  took  2 nitroglycerin and the pain subside.  BLE swelling 3-4 days ago however, symptom has subside.  Pt does not monitor bp . On 5/29 pt was seen in [facility] ED was advised:  The EKG today looks great. If you want to control your blood pressure more aggressively, increase amlodipine to 5 mg daily. Keep a check of your blood pressure and decrease dose to 2.5 mg if you develop dizziness or BP less than 100/60. If the chest pain returns, please go to the ER or call 911.   Pt stated she was advised by PCP to f/ u with cardiology. Pt is scheduled with Edd Fabian on 6/27 @ 1055, explained ED precautions pt voiced understanding. Will forward to MD and nurse for advise.

## 2022-10-23 NOTE — Progress Notes (Addendum)
Cardiology Clinic Note   Patient Name: Brittany Weaver Date of Encounter: 11/04/2022  Primary Care Provider:  Assunta Found, MD Primary Cardiologist:  Nanetta Batty, MD  Patient Profile    Brittany Weaver 82 year old female presents to the clinic today for follow-up evaluation of her chest discomfort and essential hypertension.  Past Medical History    Past Medical History:  Diagnosis Date   Chest pain    a. 05/2016 Myoview: EF 74%, no ischemia/infarct.   Elevated serum creatinine 05/29/2021   decreased GFR   History of COVID-19 11/28/2019   History of echocardiogram    a. 04/2016 Echo: EF 60-65%, no rwma.   Hypercholesterolemia    Hypertension    Macular hole of left eye    Mitral valve prolapse    no issues   Osteopenia 2013   T score -1.4 stable from prior DEXA 2007 no FRAX calculated   Paroxysmal atrial fibrillation (HCC)    a. CHA2DS2VASc = 4-->Xarelto.   Pelvic relaxation    Mild and asymptomatic.   PONV (postoperative nausea and vomiting)    "little bit of nausea" after cataract surgery   Past Surgical History:  Procedure Laterality Date   25 GAUGE PARS PLANA VITRECTOMY WITH 20 GAUGE MVR PORT FOR MACULAR HOLE Left 11/22/2014   Procedure: 25 GAUGE PARS PLANA VITRECTOMY WITH 20 GAUGE MVR PORT FOR MACULAR HOLE LEFT EYE ;  Surgeon: Sherrie George, MD;  Location: [facility] OR;  Service: Ophthalmology;  Laterality: Left;   BREAST BIOPSY Right 01/2021   CATARACT EXTRACTION, BILATERAL     Dr Elmer Picker   COLONOSCOPY     CORONARY STENT INTERVENTION N/A 05/20/2022   Procedure: CORONARY STENT INTERVENTION;  Surgeon: Runell Gess, MD;  Location: [facility] INVASIVE CV LAB;  Service: Cardiovascular;  Laterality: N/A;   DILATION AND CURETTAGE OF UTERUS     PT. DOES NOT REMEMBER THE YEAR   DOPPLER ECHOCARDIOGRAPHY  05/13/2007   EYE SURGERY Bilateral    cataract surgery with lens implant   GAS/FLUID EXCHANGE Left 11/22/2014   Procedure: GAS/FLUID EXCHANGE;  Surgeon: Sherrie George, MD;   Location: [facility] OR;  Service: Ophthalmology;  Laterality: Left;  [id number]   LASER PHOTO ABLATION Left 11/22/2014   Procedure: LASER PHOTO ABLATION;  Surgeon: Sherrie George, MD;  Location: [facility] OR;  Service: Ophthalmology;  Laterality: Left;  Headscope laser   LEFT HEART CATH AND CORONARY ANGIOGRAPHY N/A 05/20/2022   Procedure: LEFT HEART CATH AND CORONARY ANGIOGRAPHY;  Surgeon: Runell Gess, MD;  Location: [facility] INVASIVE CV LAB;  Service: Cardiovascular;  Laterality: N/A;   MEMBRANE PEEL Left 11/22/2014   Procedure: MEMBRANE PEEL;  Surgeon: Sherrie George, MD;  Location: [facility] OR;  Service: Ophthalmology;  Laterality: Left;   myoview   06/05/2009   SERUM PATCH Left 11/22/2014   Procedure: SERUM PATCH;  Surgeon: Sherrie George, MD;  Location: [facility] OR;  Service: Ophthalmology;  Laterality: Left;    Allergies  Allergies  Allergen Reactions   Penicillins Hives    Has patient had a PCN reaction causing immediate rash, facial/tongue/throat swelling, SOB or lightheadedness with hypotension: unknown Has patient had a PCN reaction causing severe rash involving mucus membranes or skin necrosis: unknown Has patient had a PCN reaction that required hospitalization: no Has patient had a PCN reaction occurring within the last 10 years: no If all of the above answers are "NO", then may proceed with Cephalosporin use.    Prednisone Other (See Comments)  DIZZINESS and swelling    History of Present Illness    Brittany Weaver has a PMH of chest discomfort with normal nuclear stress test 2/18, COVID-19 infection, normal echocardiogram 1/18 with EF of 60-65% and no regional wall motion abnormalities, hyperlipidemia, HTN, mitral valve prolapse, osteopenia, and postoperative nausea and vomiting.  She is a retired Teacher, music.  She was seen by Dr. Allyson Sabal in follow-up on 08/28/2022.  During that time she continued to do well.  She noted occasional episodes of dizziness without syncope.  She did note some  episodes of left arm pain and back pain.  She denied chest discomfort.  She underwent coronary CTA 05/09/2022 which showed a coronary calcium score of 1737.  She was noted to have significant disease in her mid LAD.  Due to her family history and risk factors outpatient diagnostic cardiac catheterization was planned.  She underwent cardiac catheterization 05/22/2020 which showed high-grade mid LAD stenosis.  She received PCI with DES x 1.  She was discharged the same day.  Her symptoms completely resolved.  She reported being active in her garden and denied limitations.  She was seen at urgent care in [facility] 09/25/2022.  She was concerned about her blood pressure.  She reported that over the past couple days she has been having chest tightness that was worse with activity.  She continued to work in her yard and garden.  She reported worsening discomfort with these activities.  She also noted mild chest tightness when she would go upstairs.  She reported taking nitroglycerin which had helped with her discomfort.  She was concerned about her stent that had been placed earlier in the year.  Her blood pressure was noted to be 150/79.  Her heart rate was 70.  Her EKG was reassuring.  ED precautions were reviewed.  Her amlodipine was increased to 5 mg daily for better blood pressure control.  She presents to the clinic today for follow-up evaluation and states she was at the beach recently and noted episodes of chest discomfort.  She reports that she notices discomfort with activity such as bending over and also noticed discomfort after drinking margaritas.  She also has recently noticed discomfort with being more physically active.  Her discomfort relieves with sublingual nitroglycerin.  Her symptoms appear both typical and not typical.  I will order Imdur 15 mg daily, Protonix 20 mg daily, refill her sublingual nitroglycerin, and order nuclear stress test.  Will plan follow-up after testing.  I will also give her  the GERD diet instructions and order CBC and BMP prior to her testing.  Today she denies chest pain, shortness of breath, lower extremity edema, fatigue, palpitations, melena, hematuria, hemoptysis, diaphoresis, weakness, presyncope, syncope, orthopnea, and PND.  Home Medications    Prior to Admission medications   Medication Sig Start Date End Date Taking? Authorizing Provider  acetaminophen (TYLENOL) 500 MG tablet Take 500-1,000 mg by mouth every 6 (six) hours as needed (pain.).    [provider]  ALPRAZolam Prudy Feeler) 0.25 MG tablet Take 0.25 mg by mouth daily as needed (when a car passenger). 09/29/19   [provider]  amLODipine (NORVASC) 2.5 MG tablet TAKE 1 TABLET BY MOUTH DAILY 08/05/22   Runell Gess, MD  Ascorbic Acid (VITAMIN C) 500 MG CAPS Take 500 mg by mouth in the morning.    [provider]  aspirin EC 81 MG tablet Take 81 mg by mouth in the morning.    [provider]  B Complex-C (B-COMPLEX WITH VITAMIN C) tablet Take 1 tablet by mouth in the morning.    [provider]  Biotin 40981 MCG TABS Take 10,000 mcg by mouth in the morning.    [provider]  calcium carbonate (OSCAL) 1500 (600 Ca) MG TABS tablet Take 600 mg of elemental calcium by mouth in the morning and at bedtime.    [provider]  Cholecalciferol (QC VITAMIN D3) 50 MCG (2000 UT) TABS Take 2,000 Units by mouth in the morning and at bedtime.    [provider]  clopidogrel (PLAVIX) 75 MG tablet Take 1 tablet (75 mg total) by mouth daily. 05/29/22   Runell Gess, MD  Coenzyme Q10 300 MG CAPS Take 300 mg by mouth in the morning.    [provider]  hydrochlorothiazide (HYDRODIURIL) 25 MG tablet Take 25 mg by mouth in the morning.    [provider]  lovastatin (MEVACOR) 40 MG tablet Take 1 tablet (40 mg total) by mouth at bedtime. 08/28/22   Runell Gess, MD  Magnesium 250 MG TABS Take 250 mg by mouth in the morning.     [provider]  Menatetrenone (VITAMIN K2) 100 MCG TABS Take 100 mcg by mouth in the morning and at bedtime.    [provider]  nitroGLYCERIN (NITROSTAT) 0.4 MG SL tablet Place 1 tablet (0.4 mg total) under the tongue every 5 (five) minutes as needed for chest pain (up to 3 doses. If taking 3rd dose call 911). 05/20/22   Laurann Montana, PA-C  Potassium 99 MG TABS Take 99 mg by mouth in the morning.    [provider]  zinc sulfate 220 (50 Zn) MG capsule Take 220 mg by mouth in the morning.    [provider]    Family History    Family History  Problem Relation Age of Onset   Stroke Mother    Heart disease Mother    Kidney disease Mother    Hypertension Mother    Hyperlipidemia Mother    Stroke Brother    Heart disease Brother    Stroke Brother    Heart disease Brother    Heart failure Sister    Cancer Sister        Esophagus/stomach   Stroke Sister    Heart disease Sister    Heart disease Father    Colon cancer Neg Hx    Rectal cancer Neg Hx    She indicated that her mother is deceased. She indicated that her father is deceased. She indicated that her sister is deceased. She indicated that both of her brothers are alive. She indicated that the status of her neg hx is unknown.  Social History    Social History   Socioeconomic History   Marital status: Married    Spouse name: Not on file   Number of children: Not on file   Years of education: Not on file   Highest education level: Not on file  Occupational History   Not on file  Tobacco Use   Smoking status: Never   Smokeless tobacco: Never  Vaping Use   Vaping Use: Never used  Substance and Sexual Activity   Alcohol use: Yes    Comment: 2 drinks/month   Drug use: No   Sexual activity: Yes    Birth control/protection: Post-menopausal    Comment: 1st intercourse 38 yo-1 partner  Other Topics Concern   Not on file  Social History Narrative  Not on file   Social Determinants  of Health   Financial Resource Strain: Not on file  Food Insecurity: Not on file  Transportation Needs: Not on file  Physical Activity: Not on file  Stress: Not on file  Social Connections: Not on file  Intimate Partner Violence: Not on file     Review of Systems    General:  No chills, fever, night sweats or weight changes.  Cardiovascular:  No chest pain, dyspnea on exertion, edema, orthopnea, palpitations, paroxysmal nocturnal dyspnea. Dermatological: No rash, lesions/masses Respiratory: No cough, dyspnea Urologic: No hematuria, dysuria Abdominal:   No nausea, vomiting, diarrhea, bright red blood per rectum, melena, or hematemesis Neurologic:  No visual changes, wkns, changes in mental status. All other systems reviewed and are otherwise negative except as noted above.  Physical Exam    VS:  BP 120/76   Pulse 69   Ht 5\' 6"  (1.676 m)   Wt 157 lb 3.2 oz (71.3 kg)   BMI 25.37 kg/m  , BMI Body mass index is 25.37 kg/m. GEN: Well nourished, well developed, in no acute distress. HEENT: normal. Neck: Supple, no JVD, carotid bruits, or masses. Cardiac: RRR, no murmurs, rubs, or gallops. No clubbing, cyanosis, edema.  Radials/DP/PT 2+ and equal bilaterally.  Respiratory:  Respirations regular and unlabored, clear to auscultation bilaterally. GI: Soft, nontender, nondistended, BS + x 4. MS: no deformity or atrophy. Skin: warm and dry, no rash. Neuro:  Strength and sensation are intact. Psych: Normal affect.  Accessory Clinical Findings    Recent Labs: 05/15/2022: Hemoglobin 14.3; Platelets 245 05/20/2022: BUN 16; Creatinine, Ser 0.93; Potassium 3.4; Sodium 136   Recent Lipid Panel    Component Value Date/Time   CHOL 149 06/17/2014 0930   TRIG 111 06/17/2014 0930   HDL 60 06/17/2014 0930   CHOLHDL 2.7 05/31/2013 1119   VLDL 30 05/31/2013 1119   LDLCALC 67 06/17/2014 0930       ECG personally reviewed by me today-none today.   Echocardiogram  06/03/2022  IMPRESSIONS     1. Global longitudinal strain is -16.2% (normal).. Left ventricular  ejection fraction, by estimation, is 60 to 65%. The left ventricle has  normal function. The left ventricle has no regional wall motion  abnormalities. Left ventricular diastolic  parameters were normal.   2. Right ventricular systolic function is low normal. The right  ventricular size is normal.   3. The mitral valve is normal in structure. Trivial mitral valve  regurgitation.   4. The aortic valve is tricuspid. Aortic valve regurgitation is not  visualized.   5. The inferior vena cava is normal in size with greater than 50%  respiratory variability, suggesting right atrial pressure of 3 mmHg.   FINDINGS   Left Ventricle: Global longitudinal strain is -16.2% (normal). Left  ventricular ejection fraction, by estimation, is 60 to 65%. The left  ventricle has normal function. The left ventricle has no regional wall  motion abnormalities. The left ventricular  internal cavity size was normal in size. There is no left ventricular  hypertrophy. Left ventricular diastolic parameters were normal.   Right Ventricle: The right ventricular size is normal. Right vetricular  wall thickness was not assessed. Right ventricular systolic function is  low normal.   Left Atrium: Left atrial size was normal in size.   Right Atrium: Right atrial size was normal in size.   Pericardium: There is no evidence of pericardial effusion.   Mitral Valve: The mitral valve is normal in  structure. Trivial mitral  valve regurgitation.   Tricuspid Valve: The tricuspid valve is normal in structure. Tricuspid  valve regurgitation is trivial.   Aortic Valve: The aortic valve is tricuspid. Aortic valve regurgitation is  not visualized.   Pulmonic Valve: The pulmonic valve was not well visualized. Pulmonic valve  regurgitation is not visualized. No evidence of pulmonic stenosis.   Aorta: The aortic root and  ascending aorta are structurally normal, with  no evidence of dilitation.   Venous: The inferior vena cava is normal in size with greater than 50%  respiratory variability, suggesting right atrial pressure of 3 mmHg.   IAS/Shunts: No atrial level shunt detected by color flow Doppler.   Cardiac catheterization 05/20/2022    Mid LAD lesion is 80% stenosed.   2nd Diag lesion is 80% stenosed.   Prox LAD lesion is 35% stenosed.   A drug-eluting stent was successfully placed using a STENT ONYX FRONTIER [id number].   Post intervention, there is a 0% residual stenosis.   The left ventricular systolic function is normal.   LV end diastolic pressure is normal.   The left ventricular ejection fraction is 55-65% by visual estimate.  Diagnostic Dominance: Right  Intervention       Assessment & Plan   1.  Coronary artery disease-reviewed recent episodes and events involving chest discomfort.  Noticed additional episodes of chest discomfort while she was at the beach last week.  Reports both typical and atypical symptoms.  Will notice discomfort with bending over and at rest as well as with increased physical activity.  Symptoms resolved with sublingual nitroglycerin. Continue current medical therapy Continue current physical activity Heart healthy low-sodium diet Order Imdur 15 mg daily, refill nitroglycerin Order nuclear stress test GERD diet instructions Order Protonix 20 mg daily  Informed Consent   Shared Decision Making/Informed Consent  The risks [chest pain, shortness of breath, cardiac arrhythmias, dizziness, blood pressure fluctuations, myocardial infarction, stroke/transient ischemic attack, nausea, vomiting, allergic reaction, radiation exposure, metallic taste sensation and life-threatening complications (estimated to be 1 in 10,000)], benefits (risk stratification, diagnosing coronary artery disease, treatment guidance) and alternatives of a nuclear stress test were discussed in  detail with Brittany Weaver and she agrees to proceed.   Essential hypertension-BP today 120/76. Continue amlodipine, Maintain blood pressure log Increase physical activity as tolerated  Hyperlipidemia-LDL 79 on 03/28/2022. High-fiber diet Continue aspirin, amlodipine, lovastatin  Paroxysmal atrial fibrillation-denies palpitations.  Anticoagulation was previously deferred due to infrequent episodes of irregular/breakthrough A-fib. Continue to monitor  Disposition: Follow-up with Dr.Berry or me in 4-6 months.   Brittany Weaver. Anel Purohit NP-C     11/04/2022, 1:44 PM [facility] [address] Office [phone] Fax [phone]    I spent 15 minutes examining this patient, reviewing medications, and using patient centered shared decision making involving her cardiac care.  Prior to her visit I spent greater than 20 minutes reviewing her past medical history,  medications, and prior cardiac tests.

## 2022-10-24 ENCOUNTER — Encounter: Payer: Self-pay | Admitting: General Practice

## 2022-10-24 ENCOUNTER — Ambulatory Visit: Payer: Medicare Other | Attending: General Practice | Admitting: General Practice

## 2022-10-24 VITALS — BP 120/76 | HR 69 | Ht 66.0 in | Wt 157.2 lb

## 2022-10-24 DIAGNOSIS — I25110 Atherosclerotic heart disease of native coronary artery with unstable angina pectoris: Secondary | ICD-10-CM

## 2022-10-24 DIAGNOSIS — I480 Paroxysmal atrial fibrillation: Secondary | ICD-10-CM

## 2022-10-24 DIAGNOSIS — E782 Mixed hyperlipidemia: Secondary | ICD-10-CM | POA: Diagnosis not present

## 2022-10-24 DIAGNOSIS — I10 Essential (primary) hypertension: Secondary | ICD-10-CM

## 2022-10-24 MED ORDER — PANTOPRAZOLE SODIUM 20 MG PO TBEC: 20.0000 mg | DELAYED_RELEASE_TABLET | Freq: Every day | ORAL | 6 refills | Status: AC

## 2022-10-24 MED ORDER — NITROGLYCERIN 0.4 MG SL SUBL: 0.4000 mg | SUBLINGUAL_TABLET | SUBLINGUAL | 1 refills | Status: AC | PRN

## 2022-10-24 MED ORDER — ISOSORBIDE MONONITRATE ER 30 MG PO TB24: 15.0000 mg | ORAL_TABLET | Freq: Every day | ORAL | 6 refills | Status: DC

## 2022-10-24 NOTE — Patient Instructions (Signed)
Medication Instructions:  START IMDUR 15MG  DAILY START PROTONIX 20MG  DAILY *If you need a refill on your cardiac medications before your next appointment, please call your pharmacy*  Lab Work: BEFORE YOUR STRESS TESTING HAVE BMET AND CBC DONE If you have labs (blood work) drawn today and your tests are completely normal, you will receive your results only by:  MyChart Message (if you have MyChart) OR  A paper copy in the mail If you have any lab test that is abnormal or we need to change your treatment, we will call you to review the results.  Testing/Procedures: LEXISCAN=Your physician has requested that you have a [employer], is a chemical stress test. Please follow instruction sheet, as given.  The Myoview Stress Test is a diagnostic test to evaluate/detect the presence of early heart disease, to assess your functional capacity, or to update the status of your coronary circulation following a cardiac event.   Follow-Up: At [facility], you and your health needs are our priority.  As part of our continuing mission to provide you with exceptional heart care, we have created designated Provider Care Teams.  These Care Teams include your primary Cardiologist (physician) and Advanced Practice Providers (APPs -  Physician Assistants and Nurse Practitioners) who all work together to provide you with the care you need, when you need it.  We recommend signing up for the patient portal called "MyChart".  Sign up information is provided on this After Visit Summary.  MyChart is used to connect with patients for Virtual Visits (Telemedicine).  Patients are able to view lab/test results, encounter notes, upcoming appointments, etc.  Non-urgent messages can be sent to your provider as well.   To learn more about what you can do with MyChart, go to [email or web address].    Your next appointment:   AFTER TESTING   Provider:   Nanetta Batty, MD  or Edd Fabian, FNP        Other  Instructions TAKE AND LOG YOUR BLOOD PRESSURE PLEASE READ AND FOLLOW GERD DIET ATTACHED  Food Choices for Gastroesophageal Reflux Disease, Adult When you have gastroesophageal reflux disease (GERD), the foods you eat and your eating habits are very important. Choosing the right foods can help ease your discomfort. Think about working with a food expert (dietitian) to help you make good choices. What are tips for following this plan? Reading food labels Look for foods that are low in saturated fat. Foods that may help with your symptoms include: Foods that have less than 5% of daily value (DV) of fat. Foods that have 0 grams of trans fat. Cooking Do not fry your food. Cook your food by baking, steaming, grilling, or broiling. These are all methods that do not need a lot of fat for cooking. To add flavor, try to use herbs that are low in spice and acidity. Meal planning  Choose healthy foods that are low in fat, such as: Fruits and vegetables. Whole grains. Low-fat dairy products. Lean meats, fish, and poultry. Eat small meals often instead of eating 3 large meals each day. Eat your meals slowly in a place where you are relaxed. Avoid bending over or lying down until 2-3 hours after eating. Limit high-fat foods such as fatty meats or fried foods. Limit your intake of fatty foods, such as oils, butter, and shortening. Avoid the following as told by your doctor: Foods that cause symptoms. These may be different for different people. Keep a food diary to keep track of foods that  cause symptoms. Alcohol. Drinking a lot of liquid with meals. Eating meals during the 2-3 hours before bed. Lifestyle Stay at a healthy weight. Ask your doctor what weight is healthy for you. If you need to lose weight, work with your doctor to do so safely. Exercise for at least 30 minutes on 5 or more days each week, or as told by your doctor. Wear loose-fitting clothes. Do not smoke or use any products that  contain nicotine or tobacco. If you need help quitting, ask your doctor. Sleep with the head of your bed higher than your feet. Use a wedge under the mattress or blocks under the bed frame to raise the head of the bed. Chew sugar-free gum after meals. What foods should eat?  Eat a healthy, well-balanced diet of fruits, vegetables, whole grains, low-fat dairy products, lean meats, fish, and poultry. Each person is different. Foods that may cause symptoms in one person may not cause any symptoms in another person. Work with your doctor to find foods that are safe for you. The items listed above may not be a complete list of what you can eat and drink. Contact a food expert for more options. What foods should I avoid? Limiting some of these foods may help in managing the symptoms of GERD. Everyone is different. Talk with a food expert or your doctor to help you find the exact foods to avoid, if any. Fruits Any fruits prepared with added fat. Any fruits that cause symptoms. For some people, this may include citrus fruits, such as oranges, grapefruit, pineapple, and lemons. Vegetables Deep-fried vegetables. [place] fries. Any vegetables prepared with added fat. Any vegetables that cause symptoms. For some people, this may include tomatoes and tomato products, chili peppers, onions and garlic, and horseradish. Grains Pastries or quick breads with added fat. Meats and other proteins High-fat meats, such as fatty beef or pork, hot dogs, ribs, ham, sausage, salami, and bacon. Fried meat or protein, including fried fish and fried chicken. Nuts and nut butters, in large amounts. Dairy Whole milk and chocolate milk. Sour cream. Cream. Ice cream. Cream cheese. Milkshakes. Fats and oils Butter. Margarine. Shortening. Ghee. Beverages Coffee and tea, with or without caffeine. Carbonated beverages. Sodas. Energy drinks. Fruit juice made with acidic fruits, such as orange or grapefruit. Tomato juice. Alcoholic  drinks. Sweets and desserts Chocolate and cocoa. Donuts. Seasonings and condiments Pepper. Peppermint and spearmint. Added salt. Any condiments, herbs, or seasonings that cause symptoms. For some people, this may include curry, hot sauce, or vinegar-based salad dressings. The items listed above may not be a complete list of what you should not eat and drink. Contact a food expert for more options. Questions to ask your doctor Diet and lifestyle changes are often the first steps that are taken to manage symptoms of GERD. If diet and lifestyle changes do not help, talk with your doctor about taking medicines. Where to find more information International [employer] for Gastrointestinal Disorders: aboutgerd.org Summary When you have GERD, food and lifestyle choices are very important in easing your symptoms. Eat small meals often instead of 3 large meals a day. Eat your meals slowly and in a place where you are relaxed. Avoid bending over or lying down until 2-3 hours after eating. Limit high-fat foods such as fatty meats or fried foods. This information is not intended to replace advice given to you by your health care provider. Make sure you discuss any questions you have with your health care provider. Document Revised:  10/25/2019 Document Reviewed: 10/25/2019 Elsevier Patient Education  2024 [employer].

## 2022-11-04 NOTE — Addendum Note (Signed)
Addended by: Ronney Asters on: 11/04/2022 01:46 PM   Modules accepted: Orders

## 2022-11-05 ENCOUNTER — Encounter (HOSPITAL_COMMUNITY): Payer: Medicare Other

## 2022-11-06 ENCOUNTER — Telehealth (HOSPITAL_COMMUNITY): Payer: Self-pay | Admitting: *Deleted

## 2022-11-06 NOTE — Telephone Encounter (Signed)
Patient given detailed instructions per Myocardial Perfusion Study Information Sheet for the test on 11/11/2022 at 10:30. Patient notified to arrive 15 minutes early and that it is imperative to arrive on time for appointment to keep from having the test rescheduled.  If you need to cancel or reschedule your appointment, please call the office within 24 hours of your appointment. . Patient verbalized understanding.Brittany Weaver

## 2022-11-07 NOTE — Progress Notes (Signed)
Cardiology Clinic Note   Patient Name: Brittany Weaver Date of Encounter: 11/07/2022  Primary Care Provider:  Assunta Found, MD Primary Cardiologist:  Brittany Batty, MD  Patient Profile    Brittany Weaver 82 year old female presents to the clinic today for follow-up evaluation of her chest discomfort and to review her stress testing.  Past Medical History    Past Medical History:  Diagnosis Date   Chest pain    a. 05/2016 Myoview: EF 74%, no ischemia/infarct.   Elevated serum creatinine 05/29/2021   decreased GFR   History of COVID-19 11/28/2019   History of echocardiogram    a. 04/2016 Echo: EF 60-65%, no rwma.   Hypercholesterolemia    Hypertension    Macular hole of left eye    Mitral valve prolapse    no issues   Osteopenia 2013   T score -1.4 stable from prior DEXA 2007 no FRAX calculated   Paroxysmal atrial fibrillation (HCC)    a. CHA2DS2VASc = 4-->Xarelto.   Pelvic relaxation    Mild and asymptomatic.   PONV (postoperative nausea and vomiting)    "little bit of nausea" after cataract surgery   Past Surgical History:  Procedure Laterality Date   25 GAUGE PARS PLANA VITRECTOMY WITH 20 GAUGE MVR PORT FOR MACULAR HOLE Left 11/22/2014   Procedure: 25 GAUGE PARS PLANA VITRECTOMY WITH 20 GAUGE MVR PORT FOR MACULAR HOLE LEFT EYE ;  Surgeon: Sherrie George, MD;  Location: [facility] OR;  Service: Ophthalmology;  Laterality: Left;   BREAST BIOPSY Right 01/2021   CATARACT EXTRACTION, BILATERAL     Dr Elmer Picker   COLONOSCOPY     CORONARY STENT INTERVENTION N/A 05/20/2022   Procedure: CORONARY STENT INTERVENTION;  Surgeon: Runell Gess, MD;  Location: [facility];  Service: Cardiovascular;  Laterality: N/A;   DILATION AND CURETTAGE OF UTERUS     PT. DOES NOT REMEMBER THE YEAR   DOPPLER ECHOCARDIOGRAPHY  05/13/2007   EYE SURGERY Bilateral    cataract surgery with lens implant   GAS/FLUID EXCHANGE Left 11/22/2014   Procedure: GAS/FLUID EXCHANGE;  Surgeon: Sherrie George, MD;  Location: [facility] OR;  Service: Ophthalmology;  Laterality: Left;  [id number]   LASER PHOTO ABLATION Left 11/22/2014   Procedure: LASER PHOTO ABLATION;  Surgeon: Sherrie George, MD;  Location: [facility] OR;  Service: Ophthalmology;  Laterality: Left;  Headscope laser   LEFT HEART CATH AND CORONARY ANGIOGRAPHY N/A 05/20/2022   Procedure: LEFT HEART CATH AND CORONARY ANGIOGRAPHY;  Surgeon: Runell Gess, MD;  Location: [facility];  Service: Cardiovascular;  Laterality: N/A;   MEMBRANE PEEL Left 11/22/2014   Procedure: MEMBRANE PEEL;  Surgeon: Sherrie George, MD;  Location: [facility] OR;  Service: Ophthalmology;  Laterality: Left;   myoview   06/05/2009   SERUM PATCH Left 11/22/2014   Procedure: SERUM PATCH;  Surgeon: Sherrie George, MD;  Location: [facility] OR;  Service: Ophthalmology;  Laterality: Left;    Allergies  Allergies  Allergen Reactions   Penicillins Hives    Has patient had a PCN reaction causing immediate rash, facial/tongue/throat swelling, SOB or lightheadedness with hypotension: unknown Has patient had a PCN reaction causing severe rash involving mucus membranes or skin necrosis: unknown Has patient had a PCN reaction that required hospitalization: no Has patient had a PCN reaction occurring within the last 10 years: no If all of the above answers are "NO", then may proceed with Cephalosporin use.    Prednisone Other (  See Comments)    DIZZINESS and swelling    History of Present Illness    Brittany Weaver has a PMH of chest discomfort with normal nuclear stress test 2/18, COVID-19 infection, normal echocardiogram 1/18 with EF of 60-65% and no regional wall motion abnormalities, hyperlipidemia, HTN, mitral valve prolapse, osteopenia, and postoperative nausea and vomiting.  She is a retired Teacher, music.  She was seen by Dr. Allyson Sabal in follow-up on 08/28/2022.  During that time she continued to do well.  She noted occasional episodes of dizziness without syncope.  She  did note some episodes of left arm pain and back pain.  She denied chest discomfort.  She underwent coronary CTA 05/09/2022 which showed a coronary calcium score of 1737.  She was noted to have significant disease in her mid LAD.  Due to her family history and risk factors outpatient diagnostic cardiac catheterization was planned.  She underwent cardiac catheterization 05/22/2020 which showed high-grade mid LAD stenosis.  She received PCI with DES x 1.  She was discharged the same day.  Her symptoms completely resolved.  She reported being active in her garden and denied limitations.  She was seen at urgent care in [facility] 09/25/2022.  She was concerned about her blood pressure.  She reported that over the past couple days she has been having chest tightness that was worse with activity.  She continued to work in her yard and garden.  She reported worsening discomfort with these activities.  She also noted mild chest tightness when she would go upstairs.  She reported taking nitroglycerin which had helped with her discomfort.  She was concerned about her stent that had been placed earlier in the year.  Her blood pressure was noted to be 150/79.  Her heart rate was 70.  Her EKG was reassuring.  ED precautions were reviewed.  Her amlodipine was increased to 5 mg daily for better blood pressure control.  She presented to the clinic 10/24/22 for follow-up evaluation and stated she was at the beach recently and noted episodes of chest discomfort.  She reported that she noticed discomfort with activity such as bending over and also noticed discomfort after drinking margaritas.  She also had  noticed discomfort with being more physically active.  Her discomfort was relieved with sublingual nitroglycerin.  Her symptoms appeared to be  both typical and not typical.  I ordered Imdur 15 mg daily, Protonix 20 mg daily, refilled her sublingual nitroglycerin, and ordered nuclear stress testing. I planned follow-up after  testing.  I will also gave her the GERD diet instructions and ordered CBC and BMP prior to her testing.  Her lab work showed slightly elevated but stable creatinine of 1.07.  Her CBC was within normal limits.  Her stress testing showed no ischemia and low risk.  She presents to the clinic today for follow-up evaluation and to review her stress testing.  She states she did have 1 episode of chest discomfort yesterday while bending over doing yard work.  We reviewed her stress testing.  She expressed understanding.  I reassured her that her chest discomfort is not related to cardiac conditions.  I will continue her current medication regimen and plan follow-up in 6 to 9 months.  Today she denies  shortness of breath, lower extremity edema, fatigue, palpitations, melena, hematuria, hemoptysis, diaphoresis, weakness, presyncope, syncope, orthopnea, and PND.    Home Medications    Prior to Admission medications   Medication Sig Start Date End Date Taking?  Authorizing Provider  acetaminophen (TYLENOL) 500 MG tablet Take 500-1,000 mg by mouth every 6 (six) hours as needed (pain.).    [provider]  ALPRAZolam Prudy Feeler) 0.25 MG tablet Take 0.25 mg by mouth daily as needed (when a car passenger). 09/29/19   [provider]  amLODipine (NORVASC) 2.5 MG tablet TAKE 1 TABLET BY MOUTH DAILY 08/05/22   Runell Gess, MD  Ascorbic Acid (VITAMIN C) 500 MG CAPS Take 500 mg by mouth in the morning.    [provider]  aspirin EC 81 MG tablet Take 81 mg by mouth in the morning.    [provider]  B Complex-C (B-COMPLEX WITH VITAMIN C) tablet Take 1 tablet by mouth in the morning.    [provider]  Biotin 30865 MCG TABS Take 10,000 mcg by mouth in the morning.    [provider]  calcium carbonate (OSCAL) 1500 (600 Ca) MG TABS tablet Take 600 mg of elemental calcium by mouth in the morning and at bedtime.    [provider]  Cholecalciferol (QC  VITAMIN D3) 50 MCG (2000 UT) TABS Take 2,000 Units by mouth in the morning and at bedtime.    [provider]  clopidogrel (PLAVIX) 75 MG tablet Take 1 tablet (75 mg total) by mouth daily. 05/29/22   Runell Gess, MD  Coenzyme Q10 300 MG CAPS Take 300 mg by mouth in the morning.    [provider]  hydrochlorothiazide (HYDRODIURIL) 25 MG tablet Take 25 mg by mouth in the morning.    [provider]  lovastatin (MEVACOR) 40 MG tablet Take 1 tablet (40 mg total) by mouth at bedtime. 08/28/22   Runell Gess, MD  Magnesium 250 MG TABS Take 250 mg by mouth in the morning.    [provider]  Menatetrenone (VITAMIN K2) 100 MCG TABS Take 100 mcg by mouth in the morning and at bedtime.    [provider]  nitroGLYCERIN (NITROSTAT) 0.4 MG SL tablet Place 1 tablet (0.4 mg total) under the tongue every 5 (five) minutes as needed for chest pain (up to 3 doses. If taking 3rd dose call 911). 05/20/22   Laurann Montana, PA-C  Potassium 99 MG TABS Take 99 mg by mouth in the morning.    [provider]  zinc sulfate 220 (50 Zn) MG capsule Take 220 mg by mouth in the morning.    [provider]    Family History    Family History  Problem Relation Age of Onset   Stroke Mother    Heart disease Mother    Kidney disease Mother    Hypertension Mother    Hyperlipidemia Mother    Stroke Brother    Heart disease Brother    Stroke Brother    Heart disease Brother    Heart failure Sister    Cancer Sister        Esophagus/stomach   Stroke Sister    Heart disease Sister    Heart disease Father    Colon cancer Neg Hx    Rectal cancer Neg Hx    She indicated that her mother is deceased. She indicated that her father is deceased. She indicated that her sister is deceased. She indicated that both of her brothers are alive. She indicated that the status of her neg hx is unknown.  Social History    Social History   Socioeconomic History    Marital status: Married    Spouse  name: Not on file   Number of children: Not on file   Years of education: Not on file   Highest education level: Not on file  Occupational History   Not on file  Tobacco Use   Smoking status: Never   Smokeless tobacco: Never  Vaping Use   Vaping status: Never Used  Substance and Sexual Activity   Alcohol use: Yes    Comment: 2 drinks/month   Drug use: No   Sexual activity: Yes    Birth control/protection: Post-menopausal    Comment: 1st intercourse 43 yo-1 partner  Other Topics Concern   Not on file  Social History Narrative   Not on file   Social Determinants of Health   Financial Resource Strain: Not on file  Food Insecurity: Not on file  Transportation Needs: Not on file  Physical Activity: Not on file  Stress: Not on file  Social Connections: Not on file  Intimate Partner Violence: Not on file     Review of Systems    General:  No chills, fever, night sweats or weight changes.  Cardiovascular:  No chest pain, dyspnea on exertion, edema, orthopnea, palpitations, paroxysmal nocturnal dyspnea. Dermatological: No rash, lesions/masses Respiratory: No cough, dyspnea Urologic: No hematuria, dysuria Abdominal:   No nausea, vomiting, diarrhea, bright red blood per rectum, melena, or hematemesis Neurologic:  No visual changes, wkns, changes in mental status. All other systems reviewed and are otherwise negative except as noted above.  Physical Exam    VS:  There were no vitals taken for this visit. , BMI There is no height or weight on file to calculate BMI. GEN: Well nourished, well developed, in no acute distress. HEENT: normal. Neck: Supple, no JVD, carotid bruits, or masses. Cardiac: RRR, no murmurs, rubs, or gallops. No clubbing, cyanosis, edema.  Radials/DP/PT 2+ and equal bilaterally.  Respiratory:  Respirations regular and unlabored, clear to auscultation bilaterally. GI: Soft, nontender, nondistended, BS + x 4. MS: no  deformity or atrophy. Skin: warm and dry, no rash. Neuro:  Strength and sensation are intact. Psych: Normal affect.  Accessory Clinical Findings    Recent Labs: 05/15/2022: Hemoglobin 14.3; Platelets 245 05/20/2022: BUN 16; Creatinine, Ser 0.93; Potassium 3.4; Sodium 136   Recent Lipid Panel    Component Value Date/Time   CHOL 149 06/17/2014 0930   TRIG 111 06/17/2014 0930   HDL 60 06/17/2014 0930   CHOLHDL 2.7 05/31/2013 1119   VLDL 30 05/31/2013 1119   LDLCALC 67 06/17/2014 0930       ECG personally reviewed by me today-none today.   Echocardiogram 06/03/2022  IMPRESSIONS     1. Global longitudinal strain is -16.2% (normal).. Left ventricular  ejection fraction, by estimation, is 60 to 65%. The left ventricle has  normal function. The left ventricle has no regional wall motion  abnormalities. Left ventricular diastolic  parameters were normal.   2. Right ventricular systolic function is low normal. The right  ventricular size is normal.   3. The mitral valve is normal in structure. Trivial mitral valve  regurgitation.   4. The aortic valve is tricuspid. Aortic valve regurgitation is not  visualized.   5. The inferior vena cava is normal in size with greater than 50%  respiratory variability, suggesting right atrial pressure of 3 mmHg.   FINDINGS   Left Ventricle: Global longitudinal strain is -16.2% (normal). Left  ventricular ejection fraction, by estimation, is 60 to 65%. The left  ventricle has normal function. The left ventricle has  no regional wall  motion abnormalities. The left ventricular  internal cavity size was normal in size. There is no left ventricular  hypertrophy. Left ventricular diastolic parameters were normal.   Right Ventricle: The right ventricular size is normal. Right vetricular  wall thickness was not assessed. Right ventricular systolic function is  low normal.   Left Atrium: Left atrial size was normal in size.   Right Atrium:  Right atrial size was normal in size.   Pericardium: There is no evidence of pericardial effusion.   Mitral Valve: The mitral valve is normal in structure. Trivial mitral  valve regurgitation.   Tricuspid Valve: The tricuspid valve is normal in structure. Tricuspid  valve regurgitation is trivial.   Aortic Valve: The aortic valve is tricuspid. Aortic valve regurgitation is  not visualized.   Pulmonic Valve: The pulmonic valve was not well visualized. Pulmonic valve  regurgitation is not visualized. No evidence of pulmonic stenosis.   Aorta: The aortic root and ascending aorta are structurally normal, with  no evidence of dilitation.   Venous: The inferior vena cava is normal in size with greater than 50%  respiratory variability, suggesting right atrial pressure of 3 mmHg.   IAS/Shunts: No atrial level shunt detected by color flow Doppler.   Cardiac catheterization 05/20/2022    Mid LAD lesion is 80% stenosed.   2nd Diag lesion is 80% stenosed.   Prox LAD lesion is 35% stenosed.   A drug-eluting stent was successfully placed using a STENT ONYX FRONTIER [id number].   Post intervention, there is a 0% residual stenosis.   The left ventricular systolic function is normal.   LV end diastolic pressure is normal.   The left ventricular ejection fraction is 55-65% by visual estimate.  Diagnostic Dominance: Right  Intervention       Nuclear stress test 11/11/22    Findings are consistent with no ischemia. The study is low risk.   No ST deviation was noted.   LV perfusion is abnormal. Defect 1: There is a medium defect with moderate reduction in uptake present in the apical to mid inferoseptal, septal and apex location(s) that is fixed. There is normal wall motion in the defect area. Consistent with artifact caused by subdiaphragmatic activity.   Left ventricular function is normal. Nuclear stress EF: 76%. The left ventricular ejection fraction is hyperdynamic (>65%). End diastolic  cavity size is normal. End systolic cavity size is normal.   Prior study available for comparison from 06/06/2016. LVEF 74%, no ischemia, low risk   Medium size, moderate intensity fixed inferoseptal, septal and apical perfusion defect with adjacent bowel, suggestive of attenuation artifact. LVEF 76% with normal wall motion. This is a low risk study. Compared to a prior study in 2018, there is no change.  Assessment & Plan   1. Coronary artery disease-stress test 11/11/2022 showed low risk and no ischemia.  Reviewed result. Continue current medical therapy Continue current physical activity Heart healthy low-sodium diet Continue Imdur 15 mg daily,  nitroglycerin, amlodipine, aspirin, Plavix    Essential hypertension-BP today 116/54. Continue amlodipine, Imdur Maintain blood pressure log Increase physical activity as tolerated  Paroxysmal atrial fibrillation-no palpitations today.  Anticoagulation was previously deferred due to infrequent episodes of irregular/breakthrough A-fib. Continue to monitor  Hyperlipidemia-on lovastatin Follows with PCP High-fiber diet  Disposition: Follow-up with Dr.Berry or me in 6-9 months.   Thomasene Ripple. Lashawna Poche NP-C     11/07/2022, 8:30 AM [facility] [address] Office [phone] Fax [phone]    I spent 13 minutes examining this patient, reviewing medications, and using patient centered shared decision making involving her cardiac care.  Prior to her visit I spent greater than 20 minutes reviewing her past medical history,  medications, and prior cardiac tests.

## 2022-11-08 DIAGNOSIS — I25110 Atherosclerotic heart disease of native coronary artery with unstable angina pectoris: Secondary | ICD-10-CM | POA: Diagnosis not present

## 2022-11-08 DIAGNOSIS — I480 Paroxysmal atrial fibrillation: Secondary | ICD-10-CM | POA: Diagnosis not present

## 2022-11-08 DIAGNOSIS — I10 Essential (primary) hypertension: Secondary | ICD-10-CM | POA: Diagnosis not present

## 2022-11-08 DIAGNOSIS — E782 Mixed hyperlipidemia: Secondary | ICD-10-CM | POA: Diagnosis not present

## 2022-11-08 LAB — BASIC METABOLIC PANEL

## 2022-11-08 LAB — CBC
MCHC: 34.3 g/dL (ref 31.5–35.7)
RBC: 4.56 x10E6/uL (ref 3.77–5.28)
RDW: 13.3 % (ref 11.7–15.4)

## 2022-11-09 LAB — BASIC METABOLIC PANEL
Calcium: 10 mg/dL (ref 8.7–10.3)
Glucose: 91 mg/dL (ref 70–99)
Potassium: 4.4 mmol/L (ref 3.5–5.2)
eGFR: 52 mL/min/{1.73_m2} — ABNORMAL LOW (ref >59)

## 2022-11-09 LAB — CBC
Hematocrit: 39.1 % (ref 34.0–46.6)
Hemoglobin: 13.4 g/dL (ref 11.1–15.9)
MCH: 29.4 pg (ref 26.6–33.0)
MCV: 86 fL (ref 79–97)
Platelets: 215 10*3/uL (ref 150–450)
WBC: 7 10*3/uL (ref 3.4–10.8)

## 2022-11-11 ENCOUNTER — Ambulatory Visit (HOSPITAL_COMMUNITY): Payer: Medicare Other | Attending: Internal Medicine

## 2022-11-11 DIAGNOSIS — I25110 Atherosclerotic heart disease of native coronary artery with unstable angina pectoris: Secondary | ICD-10-CM

## 2022-11-11 LAB — MYOCARDIAL PERFUSION IMAGING
LV dias vol: 48 mL (ref 46–106)
LV sys vol: 11 mL
Nuc Stress EF: 76 %
Peak HR: 88 {beats}/min
Rest HR: 60 {beats}/min
Rest Nuclear Isotope Dose: 9.8 mCi
SDS: 2
SRS: 9
SSS: 11
ST Depression (mm): 0 mm
Stress Nuclear Isotope Dose: 32.3 mCi
TID: 1

## 2022-11-11 MED ORDER — TECHNETIUM TC 99M TETROFOSMIN IV KIT
32.3000 | PACK | Freq: Once | INTRAVENOUS | Status: AC | PRN
  Administered 2022-11-11: 32.3 via INTRAVENOUS

## 2022-11-11 MED ORDER — TECHNETIUM TC 99M TETROFOSMIN IV KIT
9.8000 | PACK | Freq: Once | INTRAVENOUS | Status: AC | PRN
  Administered 2022-11-11: 9.8 via INTRAVENOUS

## 2022-11-11 MED ORDER — REGADENOSON 0.4 MG/5ML IV SOLN
0.4000 mg | Freq: Once | INTRAVENOUS | Status: AC
  Administered 2022-11-11: 0.4 mg via INTRAVENOUS

## 2022-11-14 ENCOUNTER — Ambulatory Visit: Payer: Medicare Other | Attending: General Practice | Admitting: General Practice

## 2022-11-14 ENCOUNTER — Encounter: Payer: Self-pay | Admitting: General Practice

## 2022-11-14 VITALS — BP 116/54 | HR 71 | Ht 66.0 in | Wt 157.4 lb

## 2022-11-14 DIAGNOSIS — E782 Mixed hyperlipidemia: Secondary | ICD-10-CM

## 2022-11-14 DIAGNOSIS — I25110 Atherosclerotic heart disease of native coronary artery with unstable angina pectoris: Secondary | ICD-10-CM | POA: Diagnosis not present

## 2022-11-14 DIAGNOSIS — I480 Paroxysmal atrial fibrillation: Secondary | ICD-10-CM

## 2022-11-14 DIAGNOSIS — I10 Essential (primary) hypertension: Secondary | ICD-10-CM | POA: Diagnosis not present

## 2022-11-14 MED ORDER — ISOSORBIDE MONONITRATE ER 30 MG PO TB24: 15.0000 mg | ORAL_TABLET | Freq: Every day | ORAL | 3 refills | Status: DC

## 2022-11-14 NOTE — Patient Instructions (Signed)
Medication Instructions:  - No changes - Refills for IMDUR were sent to [facility].  *If you need a refill on your cardiac medications before your next appointment, please call your pharmacy*   Lab Work: - None ordered   Testing/Procedures: - None   Follow-Up: At [facility], you and your health needs are our priority.  As part of our continuing mission to provide you with exceptional heart care, we have created designated Provider Care Teams.  These Care Teams include your primary Cardiologist (physician) and Advanced Practice Providers (APPs -  Physician Assistants and Nurse Practitioners) who all work together to provide you with the care you need, when you need it.  We recommend signing up for the patient portal called "MyChart".  Sign up information is provided on this After Visit Summary.  MyChart is used to connect with patients for Virtual Visits (Telemedicine).  Patients are able to view lab/test results, encounter notes, upcoming appointments, etc.  Non-urgent messages can be sent to your provider as well.   To learn more about what you can do with MyChart, go to [email or web address].    Your next appointment:   6-9 month(s)  Provider:   Nanetta Batty, MD  or Edd Fabian, FNP

## 2023-02-17 ENCOUNTER — Ambulatory Visit
Admission: EM | Admit: 2023-02-17 | Discharge: 2023-02-17 | Disposition: A | Discharge status: Home / Self Care | Payer: Medicare Other

## 2023-02-17 DIAGNOSIS — J069 Acute upper respiratory infection, unspecified: Secondary | ICD-10-CM

## 2023-02-17 NOTE — ED Triage Notes (Addendum)
Pt c/o sinus pressure, drainage, body aches, hoarseness, sore throat, and cough, x 5 days  Pt states she has taken two COVID tests and they were both negative. Pt has used otc meds with no relief

## 2023-02-17 NOTE — Discharge Instructions (Signed)
You have a viral upper respiratory infection.  Symptoms should improve over the next week to 10 days.  If you develop chest pain or shortness of breath, go to the emergency room.  Some things that can make you feel better are: - Increased rest - Increasing fluid with water/sugar free electrolytes - Acetaminophen and ibuprofen as needed for fever/pain - Salt water gargling, chloraseptic spray and throat lozenges for sore throat - OTC guaifenesin (Mucinex) 600 mg twice daily for congestion - Saline sinus flushes or a neti pot - Humidifying the air

## 2023-02-17 NOTE — ED Provider Notes (Signed)
[facility]    CSN: [id number] Arrival date & time: 02/17/23  1191      History   Chief Complaint No chief complaint on file.   HPI Brittany Weaver is a 82 y.o. female.   Patient presents today with 5 day history of "sinus issues."  Also reports body aches ear pain, and sore throat that are now improved.  Has also had a dry cough from throat irritation.  Patient denies fever, chest pain, shortness of breath, and runny or stuffy nose.  No thick, discolored purulent nasal drainage.  No abdominal pain, nausea/vomiting, diarrhea.  Has taken OTC zyrtec, flonase, and OTC cough/cold medications with improvement.  Also reports steam showers and warm compresses to sinuses seems to help.  No known sick contacts.     Past Medical History:  Diagnosis Date   Chest pain    a. 05/2016 Myoview: EF 74%, no ischemia/infarct.   Elevated serum creatinine 05/29/2021   decreased GFR   History of COVID-19 11/28/2019   History of echocardiogram    a. 04/2016 Echo: EF 60-65%, no rwma.   Hypercholesterolemia    Hypertension    Macular hole of left eye    Mitral valve prolapse    no issues   Osteopenia 2013   T score -1.4 stable from prior DEXA 2007 no FRAX calculated   Paroxysmal atrial fibrillation (HCC)    a. CHA2DS2VASc = 4-->Xarelto.   Pelvic relaxation    Mild and asymptomatic.   PONV (postoperative nausea and vomiting)    "little bit of nausea" after cataract surgery    Patient Active Problem List   Diagnosis Date Noted   Coronary artery disease involving native coronary artery of native heart with unstable angina pectoris (HCC) 05/20/2022   Chronic kidney disease, stage 3a (HCC) 05/20/2022   Hypokalemia 05/20/2022   Dizziness 01/29/2019   Chest pain 05/24/2016   Sinus bradycardia 05/24/2016   Macular hole, left eye 11/22/2014   Rectocele 09/27/2014   Essential hypertension 06/01/2013   Hyperlipidemia 06/01/2013   Paroxysmal atrial fibrillation (HCC) 06/01/2013     Past Surgical History:  Procedure Laterality Date   25 GAUGE PARS PLANA VITRECTOMY WITH 20 GAUGE MVR PORT FOR MACULAR HOLE Left 11/22/2014   Procedure: 25 GAUGE PARS PLANA VITRECTOMY WITH 20 GAUGE MVR PORT FOR MACULAR HOLE LEFT EYE ;  Surgeon: Sherrie George, MD;  Location: [facility] OR;  Service: Ophthalmology;  Laterality: Left;   BREAST BIOPSY Right 01/2021   CATARACT EXTRACTION, BILATERAL     Dr Elmer Picker   COLONOSCOPY     CORONARY STENT INTERVENTION N/A 05/20/2022   Procedure: CORONARY STENT INTERVENTION;  Surgeon: Runell Gess, MD;  Location: [facility];  Service: Cardiovascular;  Laterality: N/A;   DILATION AND CURETTAGE OF UTERUS     PT. DOES NOT REMEMBER THE YEAR   DOPPLER ECHOCARDIOGRAPHY  05/13/2007   EYE SURGERY Bilateral    cataract surgery with lens implant   GAS/FLUID EXCHANGE Left 11/22/2014   Procedure: GAS/FLUID EXCHANGE;  Surgeon: Sherrie George, MD;  Location: [facility] OR;  Service: Ophthalmology;  Laterality: Left;  [id number]   LASER PHOTO ABLATION Left 11/22/2014   Procedure: LASER PHOTO ABLATION;  Surgeon: Sherrie George, MD;  Location: [facility] OR;  Service: Ophthalmology;  Laterality: Left;  Headscope laser   LEFT HEART CATH AND CORONARY ANGIOGRAPHY N/A 05/20/2022   Procedure: LEFT HEART CATH AND CORONARY ANGIOGRAPHY;  Surgeon: Runell Gess, MD;  Location: [facility];  Service: Cardiovascular;  Laterality: N/A;   MEMBRANE PEEL Left 11/22/2014   Procedure: MEMBRANE PEEL;  Surgeon: Sherrie George, MD;  Location: [facility] OR;  Service: Ophthalmology;  Laterality: Left;   myoview   06/05/2009   SERUM PATCH Left 11/22/2014   Procedure: SERUM PATCH;  Surgeon: Sherrie George, MD;  Location: [facility] OR;  Service: Ophthalmology;  Laterality: Left;    OB History     Gravida  3   Para  3   Term  3   Preterm      AB      Living  3      SAB      IAB      Ectopic      Multiple      Live Births               Home Medications    Prior to Admission  medications   Medication Sig Start Date End Date Taking? Authorizing Provider  acetaminophen (TYLENOL) 500 MG tablet Take 500-1,000 mg by mouth every 6 (six) hours as needed (pain.).    [provider]  ALPRAZolam Prudy Feeler) 0.25 MG tablet Take 0.25 mg by mouth daily as needed (when a car passenger). 09/29/19   [provider]  amLODipine (NORVASC) 2.5 MG tablet TAKE 1 TABLET BY MOUTH DAILY 08/05/22   Runell Gess, MD  amLODipine (NORVASC) 5 MG tablet Take 5 mg by mouth daily.    [provider]  Ascorbic Acid (VITAMIN C) 500 MG CAPS Take 500 mg by mouth in the morning.    [provider]  aspirin EC 81 MG tablet Take 81 mg by mouth in the morning.    [provider]  B Complex-C (B-COMPLEX WITH VITAMIN C) tablet Take 1 tablet by mouth in the morning.    [provider]  Biotin 10960 MCG TABS Take 10,000 mcg by mouth in the morning.    [provider]  calcium carbonate (OSCAL) 1500 (600 Ca) MG TABS tablet Take 600 mg of elemental calcium by mouth in the morning and at bedtime.    [provider]  Cholecalciferol (QC VITAMIN D3) 50 MCG (2000 UT) TABS Take 2,000 Units by mouth in the morning and at bedtime.    [provider]  clopidogrel (PLAVIX) 75 MG tablet Take 1 tablet (75 mg total) by mouth daily. 05/29/22   Runell Gess, MD  Coenzyme Q10 300 MG CAPS Take 300 mg by mouth in the morning.    [provider]  hydrochlorothiazide (HYDRODIURIL) 25 MG tablet Take 25 mg by mouth in the morning.    [provider]  isosorbide mononitrate (IMDUR) 30 MG 24 hr tablet Take 0.5 tablets (15 mg total) by mouth daily. 11/14/22   Ronney Asters, NP  lovastatin (MEVACOR) 40 MG tablet Take 1 tablet (40 mg total) by mouth at bedtime. 08/28/22   Runell Gess, MD  Magnesium 250 MG TABS Take 250 mg by mouth in the morning.    [provider]  Menatetrenone (VITAMIN K2) 100 MCG TABS Take 100 mcg by mouth  in the morning and at bedtime.    [provider]  nitroGLYCERIN (NITROSTAT) 0.4 MG SL tablet Place 1 tablet (0.4 mg total) under the tongue every 5 (five) minutes as needed for chest pain (up to 3 doses. If taking 3rd dose call 911). 10/24/22   Ronney Asters, NP  pantoprazole (PROTONIX) 20 MG tablet Take 1 tablet (  20 mg total) by mouth daily. 10/24/22   Ronney Asters, NP  Potassium 99 MG TABS Take 99 mg by mouth in the morning.    [provider]  potassium chloride (KLOR-CON M) 10 MEQ tablet Take by mouth. 11/09/19   [provider]  Vitamin E (VITAMIN E/D-ALPHA NATURAL) 268 MG (400 UNIT) CAPS Take by mouth. 11/09/19   [provider]  zinc sulfate 220 (50 Zn) MG capsule Take 220 mg by mouth in the morning.    [provider]    Family History Family History  Problem Relation Age of Onset   Stroke Mother    Heart disease Mother    Kidney disease Mother    Hypertension Mother    Hyperlipidemia Mother    Stroke Brother    Heart disease Brother    Stroke Brother    Heart disease Brother    Heart failure Sister    Cancer Sister        Esophagus/stomach   Stroke Sister    Heart disease Sister    Heart disease Father    Colon cancer Neg Hx    Rectal cancer Neg Hx     Social History Social History   Tobacco Use   Smoking status: Never   Smokeless tobacco: Never  Vaping Use   Vaping status: Never Used  Substance Use Topics   Alcohol use: Yes    Comment: 2 drinks/month   Drug use: No     Allergies   Penicillins and Prednisone   Review of Systems Review of Systems Per HPI  Physical Exam Triage Vital Signs ED Triage Vitals  Encounter Vitals Group     BP 02/17/23 1058 119/69     Systolic BP Percentile --      Diastolic BP Percentile --      Pulse Rate 02/17/23 1058 74     Resp 02/17/23 1058 18     Temp 02/17/23 1058 97.9 F (36.6 C)     Temp Source 02/17/23 1058 Oral     SpO2 02/17/23 1058 98 %     Weight --       Height --      Head Circumference --      Peak Flow --      Pain Score 02/17/23 1102 0     Pain Loc --      Pain Education --      Exclude from Growth Chart --    No data found.  Updated Vital Signs BP 119/69 (BP Location: Right Arm)   Pulse 74   Temp 97.9 F (36.6 C) (Oral)   Resp 18   SpO2 98%   Visual Acuity Right Eye Distance:   Left Eye Distance:   Bilateral Distance:    Right Eye Near:   Left Eye Near:    Bilateral Near:     Physical Exam Vitals and nursing note reviewed.  Constitutional:      General: She is not in acute distress.    Appearance: Normal appearance. She is not ill-appearing or toxic-appearing.  HENT:     Head: Normocephalic and atraumatic.     Right Ear: Tympanic membrane, ear canal and external ear normal.     Left Ear: Tympanic membrane, ear canal and external ear normal.     Nose: No congestion or rhinorrhea.     Mouth/Throat:     Mouth: Mucous membranes are moist.     Pharynx: Oropharynx is clear. Posterior oropharyngeal erythema present.  No oropharyngeal exudate.  Eyes:     General: No scleral icterus.    Extraocular Movements: Extraocular movements intact.  Cardiovascular:     Rate and Rhythm: Normal rate and regular rhythm.  Pulmonary:     Effort: Pulmonary effort is normal. No respiratory distress.     Breath sounds: Normal breath sounds. No wheezing, rhonchi or rales.  Musculoskeletal:     Cervical back: Normal range of motion and neck supple.  Lymphadenopathy:     Cervical: No cervical adenopathy.  Skin:    General: Skin is warm and dry.     Coloration: Skin is not jaundiced or pale.     Findings: No erythema or rash.  Neurological:     Mental Status: She is alert and oriented to person, place, and time.  Psychiatric:        Behavior: Behavior is cooperative.      UC Treatments / Results  Labs (all labs ordered are listed, but only abnormal results are displayed) Labs Reviewed - No data to  display  EKG   Radiology No results found.  Procedures Procedures (including critical care time)  Medications Ordered in UC Medications - No data to display  Initial Impression / Assessment and Plan / UC Course  I have reviewed the triage vital signs and the nursing notes.  Pertinent labs & imaging results that were available during my care of the patient were reviewed by me and considered in my medical decision making (see chart for details).   Patient is well-appearing, normotensive, afebrile, not tachycardic, not tachypneic, oxygenating well on room air.    1. Viral URI with cough Vitals and exam are reassuring Viral testing deferred given length of symptoms Supportive care discussed ER and return precautions discussed   The patient was given the opportunity to ask questions.  All questions answered to their satisfaction.  The patient is in agreement to this plan.    Final Clinical Impressions(s) / UC Diagnoses   Final diagnoses:  Viral URI with cough     Discharge Instructions      You have a viral upper respiratory infection.  Symptoms should improve over the next week to 10 days.  If you develop chest pain or shortness of breath, go to the emergency room.  Some things that can make you feel better are: - Increased rest - Increasing fluid with water/sugar free electrolytes - Acetaminophen and ibuprofen as needed for fever/pain - Salt water gargling, chloraseptic spray and throat lozenges for sore throat - OTC guaifenesin (Mucinex) 600 mg twice daily for congestion - Saline sinus flushes or a neti pot - Humidifying the air     ED Prescriptions   None    PDMP not reviewed this encounter.   Valentino Nose, NP 02/17/23 1248

## 2023-03-11 ENCOUNTER — Other Ambulatory Visit: Payer: Self-pay | Admitting: Cardiovascular Disease

## 2023-03-21 ENCOUNTER — Other Ambulatory Visit: Payer: Self-pay

## 2023-03-21 ENCOUNTER — Telehealth: Payer: Self-pay | Admitting: General Practice

## 2023-03-21 MED ORDER — AMLODIPINE BESYLATE 5 MG PO TABS: 5.0000 mg | ORAL_TABLET | Freq: Every day | ORAL | 2 refills | Status: DC

## 2023-03-21 NOTE — Telephone Encounter (Signed)
*  STAT* If patient is at the pharmacy, call can be transferred to refill team.   1. Which medications need to be refilled? (please list name of each medication and dose if known) new prescription for Amlodipine 5 mg   2. Would you like to learn more about the convenience, safety, & potential cost savings by using the [facility]?     3. Are you open to using the [facility] (Type [facility].    4. Which pharmacy/location (including street and city if local pharmacy) is medication to be sent to? [facility]   5. Do they need a 30 day or 90 day supply? 90 days and refills

## 2023-03-21 NOTE — Telephone Encounter (Signed)
Sent RX to requested Pharmacy

## 2023-04-11 ENCOUNTER — Encounter (INDEPENDENT_AMBULATORY_CARE_PROVIDER_SITE_OTHER): Payer: Medicare Other | Admitting: Ophthalmology

## 2023-04-11 DIAGNOSIS — H35342 Macular cyst, hole, or pseudohole, left eye: Secondary | ICD-10-CM | POA: Diagnosis not present

## 2023-04-11 DIAGNOSIS — I10 Essential (primary) hypertension: Secondary | ICD-10-CM

## 2023-04-11 DIAGNOSIS — H35033 Hypertensive retinopathy, bilateral: Secondary | ICD-10-CM

## 2023-04-11 DIAGNOSIS — H43811 Vitreous degeneration, right eye: Secondary | ICD-10-CM | POA: Diagnosis not present

## 2023-04-11 DIAGNOSIS — D3132 Benign neoplasm of left choroid: Secondary | ICD-10-CM | POA: Diagnosis not present

## 2023-05-05 ENCOUNTER — Other Ambulatory Visit: Payer: Self-pay | Admitting: Cardiovascular Disease

## 2023-06-13 DIAGNOSIS — E119 Type 2 diabetes mellitus without complications: Secondary | ICD-10-CM | POA: Diagnosis not present

## 2023-06-13 DIAGNOSIS — R7309 Other abnormal glucose: Secondary | ICD-10-CM | POA: Diagnosis not present

## 2023-06-13 DIAGNOSIS — I2510 Atherosclerotic heart disease of native coronary artery without angina pectoris: Secondary | ICD-10-CM | POA: Diagnosis not present

## 2023-06-13 DIAGNOSIS — E782 Mixed hyperlipidemia: Secondary | ICD-10-CM | POA: Diagnosis not present

## 2023-06-13 DIAGNOSIS — I10 Essential (primary) hypertension: Secondary | ICD-10-CM | POA: Diagnosis not present

## 2023-06-13 DIAGNOSIS — E7849 Other hyperlipidemia: Secondary | ICD-10-CM | POA: Diagnosis not present

## 2023-06-13 DIAGNOSIS — Z0001 Encounter for general adult medical examination with abnormal findings: Secondary | ICD-10-CM | POA: Diagnosis not present

## 2023-06-26 ENCOUNTER — Other Ambulatory Visit: Payer: Self-pay | Admitting: Family Medicine

## 2023-06-26 DIAGNOSIS — Z1231 Encounter for screening mammogram for malignant neoplasm of breast: Secondary | ICD-10-CM

## 2023-07-10 ENCOUNTER — Ambulatory Visit
Admission: RE | Admit: 2023-07-10 | Discharge: 2023-07-10 | Disposition: A | Discharge status: Home / Self Care | Payer: Medicare Other | Source: Ambulatory Visit | Attending: Family Medicine | Admitting: Family Medicine

## 2023-07-10 DIAGNOSIS — Z1231 Encounter for screening mammogram for malignant neoplasm of breast: Secondary | ICD-10-CM | POA: Diagnosis not present

## 2023-07-12 ENCOUNTER — Other Ambulatory Visit: Payer: Self-pay | Admitting: General Practice

## 2023-07-12 DIAGNOSIS — I25110 Atherosclerotic heart disease of native coronary artery with unstable angina pectoris: Secondary | ICD-10-CM

## 2023-08-20 ENCOUNTER — Encounter: Payer: Self-pay | Admitting: Cardiovascular Disease

## 2023-08-20 ENCOUNTER — Ambulatory Visit: Attending: Cardiovascular Disease | Admitting: Cardiovascular Disease

## 2023-08-20 VITALS — BP 132/70 | HR 76 | Ht 66.5 in | Wt 159.6 lb

## 2023-08-20 DIAGNOSIS — E782 Mixed hyperlipidemia: Secondary | ICD-10-CM

## 2023-08-20 DIAGNOSIS — I25110 Atherosclerotic heart disease of native coronary artery with unstable angina pectoris: Secondary | ICD-10-CM

## 2023-08-20 DIAGNOSIS — I480 Paroxysmal atrial fibrillation: Secondary | ICD-10-CM

## 2023-08-20 DIAGNOSIS — I10 Essential (primary) hypertension: Secondary | ICD-10-CM | POA: Diagnosis not present

## 2023-08-20 NOTE — Assessment & Plan Note (Signed)
 History of essential hypertension her blood pressure measured today at 132/70.  She is on amlodipine , HydroDIURIL .

## 2023-08-20 NOTE — Assessment & Plan Note (Signed)
 History of CAD status post symptoms of left arm and back pain with coronary CTA performed 05/09/2022 revealing a coronary calcium score of 1737 with what appeared to be disease in her mid LAD.  Based on this I performed outpatient radial diagnostic cath on her 05/20/2022 revealing a high-grade mid LAD stenosis which I stented with using a 2.25 x 18 mm long Medtronic Onyx drug-eluting stent postdilated 2.6 mm.  She was discharged home the same day.  She has had no recurrent symptoms until recently when she had chest pain after eating shrimp and drinking margaritas.  This led to a subsequent stress test which was low risk and nonischemic.  She has had no subsequent episodes and was evaluated by Lawana Pray, FNP.

## 2023-08-20 NOTE — Assessment & Plan Note (Signed)
 History of hyperlipidemia on lovastatin  with lipid profile performed 06/14/2023 revealing total cholesterol 136, LDL 58 and HDL 59.

## 2023-08-20 NOTE — Progress Notes (Signed)
 08/20/2023 Brittany Weaver   10-23-1940  [id number]  Primary Physician Minus Amel, MD Primary Cardiologist: Avanell Leigh MD Lathan Poke, [place], [place]  HPI:  Brittany Weaver is a 83 y.o.   thin-appearing, married Caucasian female, mother of 3 who I last saw in the 08/28/2022.Brittany Weaver  She is accompanied by her husband Brittany Weaver and her son Brittany Weaver who is a Oncologist here in [place].  She has a strong family history of heart disease (father died of an MI at age 62, sister had CABG and brother has had stents)., as well as hypertension, hyperlipidemia and paroxysmal A-fib. She is a retired Teacher, music. Her last Myoview  performed 3 years ago was nonischemic. She denies chest pain or shortness of breath. She does have occasional episodes of breakthrough A-fib with some dizziness but no presyncope. We will discuss oral anticoagulation which she preferred not to be on which I can't disagree with given her relative infrequency of breakthrough episodes.   She was recently admitted to [facility] with chest pain and hypertension on 05/24/16 at discharge the next day. She ruled out for myocardial infarction. She was bradycardic and her metoprolol  was discontinued. 2-D echo was normal. She is under a lot of stress at home raising 2 grandchildren. Her chest pain has since resolved since I saw her in the office 2 weeks ago. A pharmacologic Myoview  stress test performed 06/06/16 was nonischemic.   Since I saw her a year ago she continues to do well.  She has had a occasional episodes of dizziness without syncope.  She does also have episodes of left arm pain and back pain but really denies chest pain.  She had a coronary CTA performed 05/09/2022 revealing a coronary calcium score 1737 with what appears to be significant disease in her mid LAD.  Based on this, her family history risk factors we decided to proceed with outpatient diagnostic coronary angiography.   I performed radial diagnostic cath on her  05/20/2022 revealing a high-grade mid LAD stenosis which I stented using a 2.25 x 18 mm long Medtronic Onyx frontier drug-eluting stent postdilated to 2.61 mm.  She was discharged the same day.  Her symptoms had completely resolved after that.  Since I saw her a year ago she developed chest pain the following month after eating shrimp and having margaritas on 2 occasions.  This led to a Myoview  stress test 11/11/2022 which was low risk and nonischemic.  She has had no recurrent symptoms.  Current Meds  Medication Sig   acetaminophen  (TYLENOL ) 500 MG tablet Take 500-1,000 mg by mouth every 6 (six) hours as needed (pain.).   amLODipine  (NORVASC ) 2.5 MG tablet TAKE 1 TABLET BY MOUTH DAILY   amLODipine  (NORVASC ) 5 MG tablet Take 1 tablet (5 mg total) by mouth daily.   Ascorbic Acid (VITAMIN C) 500 MG CAPS Take 500 mg by mouth in the morning.   aspirin  EC 81 MG tablet Take 81 mg by mouth in the morning.   B Complex-C (B-COMPLEX WITH VITAMIN C) tablet Take 1 tablet by mouth in the morning.   Biotin 30865 MCG TABS Take 10,000 mcg by mouth in the morning.   calcium carbonate (OSCAL) 1500 (600 Ca) MG TABS tablet Take 600 mg of elemental calcium by mouth in the morning and at bedtime.   Cholecalciferol (QC VITAMIN D3) 50 MCG (2000 UT) TABS Take 2,000 Units by mouth in the morning and at bedtime.   clopidogrel  (PLAVIX ) 75 MG  tablet TAKE 1 TABLET BY MOUTH DAILY   Coenzyme Q10 300 MG CAPS Take 300 mg by mouth in the morning.   hydrochlorothiazide  (HYDRODIURIL ) 25 MG tablet Take 25 mg by mouth in the morning.   isosorbide  mononitrate (IMDUR ) 30 MG 24 hr tablet TAKE ONE-HALF TABLET BY MOUTH  DAILY   lovastatin  (MEVACOR ) 40 MG tablet TAKE 1 TABLET BY MOUTH AT  BEDTIME   Magnesium  250 MG TABS Take 250 mg by mouth in the morning.   Menatetrenone (VITAMIN K2) 100 MCG TABS Take 100 mcg by mouth in the morning and at bedtime.   nitroGLYCERIN  (NITROSTAT ) 0.4 MG SL tablet Place 1 tablet (0.4 mg total) under the tongue  every 5 (five) minutes as needed for chest pain (up to 3 doses. If taking 3rd dose call 911).   pantoprazole  (PROTONIX ) 20 MG tablet Take 1 tablet (20 mg total) by mouth daily.   Potassium 99 MG TABS Take 99 mg by mouth in the morning.   potassium chloride  (KLOR-CON  M) 10 MEQ tablet Take by mouth.   Vitamin E (VITAMIN E/D-ALPHA NATURAL) 268 MG (400 UNIT) CAPS Take by mouth.   zinc sulfate 220 (50 Zn) MG capsule Take 220 mg by mouth in the morning.     Allergies  Allergen Reactions   Penicillins Hives    Has patient had a PCN reaction causing immediate rash, facial/tongue/throat swelling, SOB or lightheadedness with hypotension: unknown Has patient had a PCN reaction causing severe rash involving mucus membranes or skin necrosis: unknown Has patient had a PCN reaction that required hospitalization: no Has patient had a PCN reaction occurring within the last 10 years: no If all of the above answers are "NO", then may proceed with Cephalosporin use.    Prednisone Other (See Comments)    DIZZINESS and swelling    Social History   Socioeconomic History   Marital status: Married    Spouse name: Not on file   Number of children: Not on file   Years of education: Not on file   Highest education level: Not on file  Occupational History   Not on file  Tobacco Use   Smoking status: Never   Smokeless tobacco: Never  Vaping Use   Vaping status: Never Used  Substance and Sexual Activity   Alcohol use: Yes    Comment: 2 drinks/month   Drug use: No   Sexual activity: Yes    Birth control/protection: Post-menopausal    Comment: 1st intercourse 24 yo-1 partner  Other Topics Concern   Not on file  Social History Narrative   Not on file   Social Drivers of Health   Financial Resource Strain: Not on file  Food Insecurity: Not on file  Transportation Needs: Not on file  Physical Activity: Not on file  Stress: Not on file  Social Connections: Not on file  Intimate Partner Violence:  Not on file     Review of Systems: General: negative for chills, fever, night sweats or weight changes.  Cardiovascular: negative for chest pain, dyspnea on exertion, edema, orthopnea, palpitations, paroxysmal nocturnal dyspnea or shortness of breath Dermatological: negative for rash Respiratory: negative for cough or wheezing Urologic: negative for hematuria Abdominal: negative for nausea, vomiting, diarrhea, bright red blood per rectum, melena, or hematemesis Neurologic: negative for visual changes, syncope, or dizziness All other systems reviewed and are otherwise negative except as noted above.    Blood pressure 132/70, pulse 76, height 5' 6.5" (1.689 m), weight 159 lb 9.6 oz (72.4  kg), SpO2 96%.  General appearance: alert and no distress Neck: no adenopathy, no carotid bruit, no JVD, supple, symmetrical, trachea midline, and thyroid  not enlarged, symmetric, no tenderness/mass/nodules Lungs: clear to auscultation bilaterally Heart: regular rate and rhythm, S1, S2 normal, no murmur, click, rub or gallop Extremities: extremities normal, atraumatic, no cyanosis or edema Pulses: 2+ and symmetric Skin: Skin color, texture, turgor normal. No rashes or lesions Neurologic: Grossly normal  EKG EKG Interpretation Date/Time:  Wednesday August 20 2023 11:24:51 EDT Ventricular Rate:  75 PR Interval:  188 QRS Duration:  68 QT Interval:  394 QTC Calculation: 439 R Axis:   28  Text Interpretation: Normal sinus rhythm Low voltage QRS Cannot rule out Anterior infarct (cited on or before 20-Aug-2023) When compared with ECG of 25-Sep-2022 13:39, PR interval has decreased T wave inversion no longer evident in Inferior leads Confirmed by Lauro Portal [phone]) on 08/20/2023 12:02:12 PM    ASSESSMENT AND PLAN:   Essential hypertension History of essential hypertension her blood pressure measured today at 132/70.  She is on amlodipine , HydroDIURIL .  Hyperlipidemia History of hyperlipidemia on  lovastatin  with lipid profile performed 06/14/2023 revealing total cholesterol 136, LDL 58 and HDL 59.  Coronary artery disease involving native coronary artery of native heart with unstable angina pectoris (HCC) History of CAD status post symptoms of left arm and back pain with coronary CTA performed 05/09/2022 revealing a coronary calcium score of 1737 with what appeared to be disease in her mid LAD.  Based on this I performed outpatient radial diagnostic cath on her 05/20/2022 revealing a high-grade mid LAD stenosis which I stented with using a 2.25 x 18 mm long Medtronic Onyx drug-eluting stent postdilated 2.6 mm.  She was discharged home the same day.  She has had no recurrent symptoms until recently when she had chest pain after eating shrimp and drinking margaritas.  This led to a subsequent stress test which was low risk and nonischemic.  She has had no subsequent episodes and was evaluated by Lawana Pray, FNP.     Avanell Leigh MD FACP,FACC,FAHA, [facility] 08/20/2023 12:07 PM

## 2023-08-20 NOTE — Patient Instructions (Signed)
 Medication Instructions:  Your physician recommends that you continue on your current medications as directed. Please refer to the Current Medication list given to you today.  *If you need a refill on your cardiac medications before your next appointment, please call your pharmacy*   Follow-Up: At [facility], you and your health needs are our priority.  As part of our continuing mission to provide you with exceptional heart care, our providers are all part of one team.  This team includes your primary Cardiologist (physician) and Advanced Practice Providers or APPs (Physician Assistants and Nurse Practitioners) who all work together to provide you with the care you need, when you need it.  Your next appointment:   12 month(s)  Provider:   Nanetta Batty, MD     We recommend signing up for the patient portal called "MyChart".  Sign up information is provided on this After Visit Summary.  MyChart is used to connect with patients for Virtual Visits (Telemedicine).  Patients are able to view lab/test results, encounter notes, upcoming appointments, etc.  Non-urgent messages can be sent to your provider as well.   To learn more about what you can do with MyChart, go to [email or web address].   Other Instructions       1st Floor: - Lobby - Registration  - Pharmacy  - Lab - Cafe  2nd Floor: - PV Lab - Diagnostic Testing (echo, CT, nuclear med)  3rd Floor: - Vacant  4th Floor: - TCTS (cardiothoracic surgery) - AFib Clinic - Structural Heart Clinic - Vascular Surgery  - Vascular Ultrasound  5th Floor: - HeartCare Cardiology (general and EP) - Clinical Pharmacy for coumadin, hypertension, lipid, weight-loss medications, and med management appointments    Valet parking services will be available as well.

## 2023-08-21 ENCOUNTER — Other Ambulatory Visit: Payer: Self-pay | Admitting: Cardiovascular Disease

## 2023-08-31 ENCOUNTER — Other Ambulatory Visit: Payer: Self-pay | Admitting: Cardiovascular Disease

## 2023-09-10 DIAGNOSIS — H04123 Dry eye syndrome of bilateral lacrimal glands: Secondary | ICD-10-CM | POA: Diagnosis not present

## 2023-09-10 DIAGNOSIS — H35342 Macular cyst, hole, or pseudohole, left eye: Secondary | ICD-10-CM | POA: Diagnosis not present

## 2023-09-10 DIAGNOSIS — D3132 Benign neoplasm of left choroid: Secondary | ICD-10-CM | POA: Diagnosis not present

## 2023-09-10 DIAGNOSIS — H40013 Open angle with borderline findings, low risk, bilateral: Secondary | ICD-10-CM | POA: Diagnosis not present

## 2023-09-29 ENCOUNTER — Other Ambulatory Visit: Payer: Self-pay | Admitting: Cardiovascular Disease

## 2023-09-29 DIAGNOSIS — I25110 Atherosclerotic heart disease of native coronary artery with unstable angina pectoris: Secondary | ICD-10-CM

## 2023-11-07 ENCOUNTER — Other Ambulatory Visit: Payer: Self-pay | Admitting: Cardiovascular Disease

## 2024-04-16 ENCOUNTER — Encounter (INDEPENDENT_AMBULATORY_CARE_PROVIDER_SITE_OTHER): Payer: BC Managed Care – PPO | Admitting: Ophthalmology

## 2024-04-16 DIAGNOSIS — D3132 Benign neoplasm of left choroid: Secondary | ICD-10-CM

## 2024-04-16 DIAGNOSIS — H35033 Hypertensive retinopathy, bilateral: Secondary | ICD-10-CM

## 2024-04-16 DIAGNOSIS — I10 Essential (primary) hypertension: Secondary | ICD-10-CM

## 2024-04-16 DIAGNOSIS — H43811 Vitreous degeneration, right eye: Secondary | ICD-10-CM

## 2024-05-01 ENCOUNTER — Other Ambulatory Visit: Payer: Self-pay | Admitting: Cardiovascular Disease

## 2025-04-18 ENCOUNTER — Encounter (INDEPENDENT_AMBULATORY_CARE_PROVIDER_SITE_OTHER): Admitting: Ophthalmology
